# Patient Record
Sex: Female | Born: 1950 | Race: White | Hispanic: No | Marital: Married | Smoking: Current some day smoker
Health system: Southern US, Community
[De-identification: ages and names within clinical notes are randomized; demographics above are authoritative.]

## PROBLEM LIST (undated history)

## (undated) DIAGNOSIS — M1990 Unspecified osteoarthritis, unspecified site: Secondary | ICD-10-CM

## (undated) DIAGNOSIS — Z8679 Personal history of other diseases of the circulatory system: Secondary | ICD-10-CM

## (undated) DIAGNOSIS — Z9189 Other specified personal risk factors, not elsewhere classified: Secondary | ICD-10-CM

## (undated) DIAGNOSIS — D126 Benign neoplasm of colon, unspecified: Secondary | ICD-10-CM

## (undated) DIAGNOSIS — K449 Diaphragmatic hernia without obstruction or gangrene: Secondary | ICD-10-CM

## (undated) DIAGNOSIS — R011 Cardiac murmur, unspecified: Secondary | ICD-10-CM

## (undated) DIAGNOSIS — R42 Dizziness and giddiness: Secondary | ICD-10-CM

## (undated) DIAGNOSIS — J449 Chronic obstructive pulmonary disease, unspecified: Secondary | ICD-10-CM

## (undated) DIAGNOSIS — S2239XA Fracture of one rib, unspecified side, initial encounter for closed fracture: Secondary | ICD-10-CM

## (undated) DIAGNOSIS — G8929 Other chronic pain: Secondary | ICD-10-CM

## (undated) DIAGNOSIS — M4310 Spondylolisthesis, site unspecified: Secondary | ICD-10-CM

## (undated) DIAGNOSIS — I10 Essential (primary) hypertension: Secondary | ICD-10-CM

## (undated) DIAGNOSIS — M25361 Other instability, right knee: Secondary | ICD-10-CM

## (undated) DIAGNOSIS — F331 Major depressive disorder, recurrent, moderate: Secondary | ICD-10-CM

## (undated) DIAGNOSIS — T1491XA Suicide attempt, initial encounter: Secondary | ICD-10-CM

## (undated) DIAGNOSIS — M549 Dorsalgia, unspecified: Secondary | ICD-10-CM

## (undated) DIAGNOSIS — K219 Gastro-esophageal reflux disease without esophagitis: Secondary | ICD-10-CM

## (undated) DIAGNOSIS — E119 Type 2 diabetes mellitus without complications: Secondary | ICD-10-CM

## (undated) DIAGNOSIS — K760 Fatty (change of) liver, not elsewhere classified: Secondary | ICD-10-CM

## (undated) DIAGNOSIS — F419 Anxiety disorder, unspecified: Secondary | ICD-10-CM

## (undated) DIAGNOSIS — S065XAA Traumatic subdural hemorrhage with loss of consciousness status unknown, initial encounter: Secondary | ICD-10-CM

## (undated) DIAGNOSIS — R296 Repeated falls: Secondary | ICD-10-CM

## (undated) DIAGNOSIS — E7800 Pure hypercholesterolemia, unspecified: Secondary | ICD-10-CM

## (undated) DIAGNOSIS — M797 Fibromyalgia: Secondary | ICD-10-CM

## (undated) DIAGNOSIS — M545 Low back pain, unspecified: Secondary | ICD-10-CM

## (undated) DIAGNOSIS — J45909 Unspecified asthma, uncomplicated: Secondary | ICD-10-CM

## (undated) DIAGNOSIS — R531 Weakness: Secondary | ICD-10-CM

## (undated) DIAGNOSIS — K2100 Gastro-esophageal reflux disease with esophagitis, without bleeding: Secondary | ICD-10-CM

## (undated) DIAGNOSIS — M5412 Radiculopathy, cervical region: Secondary | ICD-10-CM

## (undated) DIAGNOSIS — Z01818 Encounter for other preprocedural examination: Secondary | ICD-10-CM

## (undated) DIAGNOSIS — I2699 Other pulmonary embolism without acute cor pulmonale: Secondary | ICD-10-CM

## (undated) DIAGNOSIS — J309 Allergic rhinitis, unspecified: Secondary | ICD-10-CM

## (undated) DIAGNOSIS — I209 Angina pectoris, unspecified: Secondary | ICD-10-CM

## (undated) DIAGNOSIS — E079 Disorder of thyroid, unspecified: Secondary | ICD-10-CM

## (undated) HISTORY — PX: CARDIAC CATHETERIZATION: SHX172

## (undated) HISTORY — DX: Personal history of other diseases of the circulatory system: Z86.79

## (undated) HISTORY — PX: PERIPHERAL VASCULAR THROMBECTOMY: CATH118306

## (undated) HISTORY — PX: EYE SURGERY: SHX253

## (undated) HISTORY — PX: CHOLECYSTECTOMY: SHX55

## (undated) HISTORY — PX: COLONOSCOPY: SHX174

## (undated) HISTORY — PX: ESOPHAGOGASTRODUODENOSCOPY: SHX1529

## (undated) HISTORY — PX: ABDOMINAL HYSTERECTOMY: SHX81

---
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
## 2003-10-07 DIAGNOSIS — Z9889 Other specified postprocedural states: Secondary | ICD-10-CM

## 2003-10-07 HISTORY — PX: WRIST SURGERY: SHX841

## 2003-10-07 HISTORY — DX: Other specified postprocedural states: Z98.890

## 2004-04-22 ENCOUNTER — Other Ambulatory Visit: Payer: Self-pay

## 2004-07-06 ENCOUNTER — Ambulatory Visit: Payer: Self-pay | Admitting: Internal Medicine

## 2006-04-28 ENCOUNTER — Ambulatory Visit: Payer: Self-pay | Admitting: Unknown Physician Specialty

## 2006-11-03 ENCOUNTER — Observation Stay: Payer: Self-pay | Admitting: Internal Medicine

## 2006-11-03 ENCOUNTER — Other Ambulatory Visit: Payer: Self-pay

## 2006-12-04 ENCOUNTER — Ambulatory Visit: Payer: Self-pay | Admitting: Unknown Physician Specialty

## 2007-01-19 ENCOUNTER — Emergency Department: Payer: Self-pay | Admitting: Unknown Physician Specialty

## 2007-02-01 ENCOUNTER — Ambulatory Visit: Payer: Self-pay | Admitting: Cardiology

## 2008-01-12 ENCOUNTER — Other Ambulatory Visit: Payer: Self-pay

## 2008-01-12 ENCOUNTER — Emergency Department: Payer: Self-pay | Admitting: Emergency Medicine

## 2008-07-06 ENCOUNTER — Ambulatory Visit: Payer: Self-pay | Admitting: Unknown Physician Specialty

## 2008-08-25 ENCOUNTER — Emergency Department: Payer: Self-pay | Admitting: Internal Medicine

## 2009-10-08 ENCOUNTER — Ambulatory Visit: Payer: Self-pay | Admitting: Neurosurgery

## 2010-01-10 ENCOUNTER — Encounter: Admission: RE | Admit: 2010-01-10 | Discharge: 2010-01-10 | Payer: Self-pay | Admitting: Neurosurgery

## 2010-03-06 ENCOUNTER — Ambulatory Visit: Payer: Self-pay | Admitting: Otolaryngology

## 2010-10-27 ENCOUNTER — Ambulatory Visit: Payer: Self-pay | Admitting: Neurosurgery

## 2011-10-07 HISTORY — PX: CATARACT EXTRACTION W/ INTRAOCULAR LENS IMPLANT: SHX1309

## 2012-01-27 ENCOUNTER — Ambulatory Visit: Payer: Self-pay | Admitting: Ophthalmology

## 2012-01-27 ENCOUNTER — Ambulatory Visit: Payer: Self-pay | Admitting: Unknown Physician Specialty

## 2012-01-27 DIAGNOSIS — I10 Essential (primary) hypertension: Secondary | ICD-10-CM

## 2012-01-27 LAB — POTASSIUM: Potassium: 3.8 mmol/L (ref 3.5–5.1)

## 2012-01-30 ENCOUNTER — Ambulatory Visit: Payer: Self-pay | Admitting: Unknown Physician Specialty

## 2012-02-04 ENCOUNTER — Ambulatory Visit: Payer: Self-pay | Admitting: Ophthalmology

## 2012-02-04 LAB — PATHOLOGY REPORT

## 2012-06-16 ENCOUNTER — Other Ambulatory Visit: Payer: Self-pay | Admitting: Unknown Physician Specialty

## 2012-06-16 LAB — CLOSTRIDIUM DIFFICILE BY PCR

## 2012-06-18 LAB — STOOL CULTURE

## 2012-06-24 ENCOUNTER — Other Ambulatory Visit: Payer: Self-pay | Admitting: Unknown Physician Specialty

## 2012-10-20 ENCOUNTER — Other Ambulatory Visit: Payer: Self-pay | Admitting: Neurosurgery

## 2012-11-17 ENCOUNTER — Other Ambulatory Visit: Payer: Self-pay | Admitting: Neurosurgery

## 2013-12-14 ENCOUNTER — Ambulatory Visit: Payer: Self-pay | Admitting: Unknown Physician Specialty

## 2015-01-28 NOTE — Op Note (Signed)
PATIENT NAME:  Belinda Garcia, DEBELL MR#:  [id number] DATE OF BIRTH:  03-Jan-1951  DATE OF PROCEDURE:  02/04/2012  PREOPERATIVE DIAGNOSIS:  Cataract, left eye.    POSTOPERATIVE DIAGNOSIS:  Cataract, left eye.  PROCEDURE PERFORMED:  Extracapsular cataract extraction using phacoemulsification with placement of an Alcon [id number], 20.0-diopter posterior chamber lens, serial [id number].  SURGEON:  Loura Back. Willy Pinkerton, MD  ASSISTANT:  None.  ANESTHESIA:  4% lidocaine and 0.75% Marcaine in a 50/50 mixture with 10 units/mL of Hylenex added, given as a peribulbar.   ANESTHESIOLOGIST: Dr. Benjamine Mola  COMPLICATIONS:  None.  ESTIMATED BLOOD LOSS:  Less than 1 ml.  DESCRIPTION OF PROCEDURE:  The patient was brought to the operating room and given a peribulbar block.  The patient was then prepped and draped in the usual fashion.  The vertical rectus muscles were imbricated using 5-0 silk sutures.  These sutures were then clamped to the sterile drapes as bridle sutures.  A limbal peritomy was performed extending two clock hours and hemostasis was obtained with cautery.  A partial thickness scleral groove was made at the surgical limbus and dissected anteriorly in a lamellar dissection using an Alcon crescent knife.  The anterior chamber was entered supero-temporally with a Superblade and through the lamellar dissection with a 2.6 mm keratome.  DisCoVisc was used to replace the aqueous and a continuous tear capsulorrhexis was carried out.  Hydrodissection and hydrodelineation were carried out with balanced salt and a 27 gauge canula.  The nucleus was rotated to confirm the effectiveness of the hydrodissection.  Phacoemulsification was carried out using a divide-and-conquer technique.  Total ultrasound time was 2 minutes and 11 seconds with an average power of 24.8 percent and CDE 55.63.  Irrigation/aspiration was used to remove the residual cortex.  DisCoVisc was used to inflate the capsule and the internal incision  was enlarged to 3 mm with the crescent knife.  The intraocular lens was folded and inserted into the capsular bag using the AcrySert delivery system. Irrigation/aspiration was used to remove the residual DisCoVisc.  Miostat was injected into the anterior chamber through the paracentesis track to inflate the anterior chamber and induce miosis.  The wound was checked for leaks and none were found. The conjunctiva was closed with cautery and the bridle sutures were removed.  Two drops of 0.3% Vigamox were placed on the eye.   An eye shield was placed on the eye.  The patient was discharged to the recovery room in good condition. ____________________________ Loura Back Rykar Lebleu, MD sad:slb D: 02/04/2012 11:53:06 ET T: 02/04/2012 12:04:22 ET JOB#: [id number]  cc: Remo Lipps A. Suraiya Dickerson, MD, <Dictator> Martie Lee MD ELECTRONICALLY SIGNED 02/09/2012 13:44

## 2015-11-28 DIAGNOSIS — J45909 Unspecified asthma, uncomplicated: Secondary | ICD-10-CM | POA: Diagnosis present

## 2015-11-28 DIAGNOSIS — R011 Cardiac murmur, unspecified: Secondary | ICD-10-CM | POA: Insufficient documentation

## 2015-11-28 DIAGNOSIS — I10 Essential (primary) hypertension: Secondary | ICD-10-CM | POA: Diagnosis present

## 2015-11-28 DIAGNOSIS — E119 Type 2 diabetes mellitus without complications: Secondary | ICD-10-CM

## 2015-11-28 DIAGNOSIS — I2699 Other pulmonary embolism without acute cor pulmonale: Secondary | ICD-10-CM | POA: Diagnosis present

## 2016-03-24 ENCOUNTER — Emergency Department
Admission: EM | Admit: 2016-03-24 | Discharge: 2016-03-24 | Disposition: A | Discharge status: Home / Self Care | Payer: Medicare Other | Attending: Emergency Medicine | Admitting: Emergency Medicine

## 2016-03-24 ENCOUNTER — Emergency Department: Payer: Medicare Other

## 2016-03-24 ENCOUNTER — Encounter: Payer: Self-pay | Admitting: Emergency Medicine

## 2016-03-24 DIAGNOSIS — Z7982 Long term (current) use of aspirin: Secondary | ICD-10-CM | POA: Diagnosis not present

## 2016-03-24 DIAGNOSIS — I10 Essential (primary) hypertension: Secondary | ICD-10-CM | POA: Diagnosis not present

## 2016-03-24 DIAGNOSIS — W1800XA Striking against unspecified object with subsequent fall, initial encounter: Secondary | ICD-10-CM | POA: Insufficient documentation

## 2016-03-24 DIAGNOSIS — S0990XA Unspecified injury of head, initial encounter: Secondary | ICD-10-CM

## 2016-03-24 DIAGNOSIS — M549 Dorsalgia, unspecified: Secondary | ICD-10-CM | POA: Insufficient documentation

## 2016-03-24 DIAGNOSIS — G8929 Other chronic pain: Secondary | ICD-10-CM | POA: Diagnosis not present

## 2016-03-24 DIAGNOSIS — E119 Type 2 diabetes mellitus without complications: Secondary | ICD-10-CM | POA: Insufficient documentation

## 2016-03-24 DIAGNOSIS — F17210 Nicotine dependence, cigarettes, uncomplicated: Secondary | ICD-10-CM | POA: Insufficient documentation

## 2016-03-24 DIAGNOSIS — Y939 Activity, unspecified: Secondary | ICD-10-CM | POA: Diagnosis not present

## 2016-03-24 DIAGNOSIS — Y929 Unspecified place or not applicable: Secondary | ICD-10-CM | POA: Insufficient documentation

## 2016-03-24 DIAGNOSIS — R110 Nausea: Secondary | ICD-10-CM

## 2016-03-24 DIAGNOSIS — S069X9A Unspecified intracranial injury with loss of consciousness of unspecified duration, initial encounter: Secondary | ICD-10-CM | POA: Diagnosis not present

## 2016-03-24 DIAGNOSIS — R42 Dizziness and giddiness: Secondary | ICD-10-CM

## 2016-03-24 DIAGNOSIS — Z79899 Other long term (current) drug therapy: Secondary | ICD-10-CM | POA: Diagnosis not present

## 2016-03-24 DIAGNOSIS — Y999 Unspecified external cause status: Secondary | ICD-10-CM | POA: Diagnosis not present

## 2016-03-24 HISTORY — DX: Anxiety disorder, unspecified: F41.9

## 2016-03-24 HISTORY — DX: Essential (primary) hypertension: I10

## 2016-03-24 HISTORY — DX: Gastro-esophageal reflux disease without esophagitis: K21.9

## 2016-03-24 HISTORY — DX: Type 2 diabetes mellitus without complications: E11.9

## 2016-03-24 HISTORY — DX: Pure hypercholesterolemia, unspecified: E78.00

## 2016-03-24 HISTORY — DX: Disorder of thyroid, unspecified: E07.9

## 2016-03-24 HISTORY — DX: Diaphragmatic hernia without obstruction or gangrene: K44.9

## 2016-03-24 LAB — URINALYSIS COMPLETE WITH MICROSCOPIC (ARMC ONLY)
BACTERIA UA: NONE SEEN
Bilirubin Urine: NEGATIVE
Glucose, UA: NEGATIVE mg/dL
Hgb urine dipstick: NEGATIVE
KETONES UR: NEGATIVE mg/dL
LEUKOCYTES UA: NEGATIVE
Nitrite: NEGATIVE
PH: 7 (ref 5.0–8.0)
PROTEIN: NEGATIVE mg/dL
Specific Gravity, Urine: 1.004 — ABNORMAL LOW (ref 1.005–1.030)

## 2016-03-24 LAB — CBC
HEMATOCRIT: 48 % — AB (ref 35.0–47.0)
HEMOGLOBIN: 15.9 g/dL (ref 12.0–16.0)
MCH: 31.8 pg (ref 26.0–34.0)
MCHC: 33.2 g/dL (ref 32.0–36.0)
MCV: 95.8 fL (ref 80.0–100.0)
PLATELETS: 345 10*3/uL (ref 150–440)
RBC: 5 MIL/uL (ref 3.80–5.20)
RDW: 13.2 % (ref 11.5–14.5)
WBC: 9.3 10*3/uL (ref 3.6–11.0)

## 2016-03-24 LAB — BASIC METABOLIC PANEL
Anion gap: 10 (ref 5–15)
BUN: 9 mg/dL (ref 6–20)
CHLORIDE: 103 mmol/L (ref 101–111)
CO2: 24 mmol/L (ref 22–32)
Calcium: 9.3 mg/dL (ref 8.9–10.3)
Creatinine, Ser: 0.7 mg/dL (ref 0.44–1.00)
GFR calc non Af Amer: >60 mL/min (ref >60)
Glucose, Bld: 138 mg/dL — ABNORMAL HIGH (ref 65–99)
POTASSIUM: 3.7 mmol/L (ref 3.5–5.1)
SODIUM: 137 mmol/L (ref 135–145)

## 2016-03-24 MED ORDER — ONDANSETRON 4 MG PO TBDP
4.0000 mg | ORAL_TABLET | Freq: Once | ORAL | Status: AC
  Administered 2016-03-24: 4 mg via ORAL
  Filled 2016-03-24: qty 1

## 2016-03-24 MED ORDER — MECLIZINE HCL 25 MG PO TABS
25.0000 mg | ORAL_TABLET | Freq: Once | ORAL | Status: AC
  Administered 2016-03-24: 25 mg via ORAL
  Filled 2016-03-24: qty 1

## 2016-03-24 MED ORDER — MECLIZINE HCL 25 MG PO TABS: 25.0000 mg | ORAL_TABLET | Freq: Three times a day (TID) | ORAL | Status: DC | PRN

## 2016-03-24 MED ORDER — ONDANSETRON 4 MG PO TBDP: ORAL_TABLET | ORAL | Status: DC

## 2016-03-24 NOTE — Discharge Instructions (Signed)

## 2016-03-24 NOTE — ED Provider Notes (Signed)
[facility] Emergency Department Provider Note  ____________________________________________  Time seen: Approximately 3:43 PM  I have reviewed the triage vital signs and the nursing notes.   HISTORY  Chief Complaint Back Pain; Loss of Consciousness; and Headache    HPI MANASA LAROCHE is a 65 y.o. female with a PMH of workman's comp related back injury and chronic back pain followed by a neurologist.  She presents for evaluation of lightheadedness, dizziness, and nausea after a fall yesterday.  She states that her back "locked up on me" yesterday causing her to fall and strike her head on the floor.  She states she was knocked unconscious for an unspecified period of time.  Since then she has been nauseated and had a lack of appetite, as well as feeling dizzy and lightheaded when she ambulates.  She called her neurologist's office and they recommended coming to the emergency department for evaluation.  She denies any neck pain and only a mild generalized headache mostly in the frontal area where she struck her head.  She has had no numbness nor tingling in any of her extremities.  She has no difficulty with coordination.  She has had nausea but no vomiting.  She denies chest pain and shortness of breath.  She states that her back feels the same as it always does and that she did not strike her back or sustain any other traumatic injuries.  Symptoms overall as mild to severe, helped with rest and worse with exertion.   Past Medical History  Diagnosis Date  . Diabetes mellitus without complication ([place])   . Thyroid disease   . Hypertension   . High cholesterol   . Anxiety   . GERD (gastroesophageal reflux disease)   . Hiatal hernia     There are no active problems to display for this patient.   Past Surgical History  Procedure Laterality Date  . Abdominal hysterectomy    . Cholecystectomy      Current Outpatient Rx  Name  Route  Sig  Dispense  Refill   . amLODipine (NORVASC) 10 MG tablet   Oral   Take 5 mg by mouth 2 (two) times daily.         Marland Kitchen aspirin-acetaminophen-caffeine (EXCEDRIN MIGRAINE) 250-250-65 MG tablet   Oral   Take 2 tablets by mouth every 6 (six) hours as needed for migraine.         Marland Kitchen atorvastatin (LIPITOR) 40 MG tablet   Oral   Take 40 mg by mouth at bedtime.         . cholecalciferol (VITAMIN D) 1000 units tablet   Oral   Take 1,000 Units by mouth daily.         . cyclobenzaprine (FLEXERIL) 10 MG tablet   Oral   Take 10 mg by mouth 3 (three) times daily as needed for muscle spasms.         Marland Kitchen escitalopram (LEXAPRO) 10 MG tablet   Oral   Take 10 mg by mouth daily.         Marland Kitchen levothyroxine (SYNTHROID, LEVOTHROID) 125 MCG tablet   Oral   Take 125 mcg by mouth daily before breakfast.         . losartan-hydrochlorothiazide (HYZAAR) 100-25 MG tablet   Oral   Take 1 tablet by mouth daily.         . meloxicam (MOBIC) 15 MG tablet   Oral   Take 15 mg by mouth daily.         Marland Kitchen  metoprolol succinate (TOPROL-XL) 25 MG 24 hr tablet   Oral   Take 25 mg by mouth daily.         . montelukast (SINGULAIR) 10 MG tablet   Oral   Take 10 mg by mouth at bedtime.         Marland Kitchen oxyCODONE-acetaminophen (PERCOCET) 10-325 MG tablet   Oral   Take 1 tablet by mouth every 4 (four) hours as needed for pain.         . pregabalin (LYRICA) 75 MG capsule   Oral   Take 75 mg by mouth 2 (two) times daily.         . traZODone (DESYREL) 100 MG tablet   Oral   Take 200 mg by mouth at bedtime.         . meclizine (ANTIVERT) 25 MG tablet   Oral   Take 1 tablet (25 mg total) by mouth 3 (three) times daily as needed for dizziness.   30 tablet   0   . ondansetron (ZOFRAN ODT) 4 MG disintegrating tablet      Allow 1-2 tablets to dissolve in your mouth every 8 hours as needed for nausea/vomiting   30 tablet   0     Allergies Review of patient's allergies indicates no known allergies.  No family  history on file.  Social History Social History  Substance Use Topics  . Smoking status: Current Some Day Smoker    Types: Cigarettes  . Smokeless tobacco: Never Used  . Alcohol Use: No    Review of Systems Constitutional: No fever/chills Eyes: No visual changes. ENT: No sore throat. Cardiovascular: Denies chest pain. Respiratory: Denies shortness of breath. Gastrointestinal: No abdominal pain.  No nausea, no vomiting.  No diarrhea.  No constipation. Genitourinary: Negative for dysuria. Musculoskeletal: Chronic back pain.  Frontal head contusion. Skin: Negative for rash. Neurological: Negative for headaches, focal weakness or numbness.  10-point ROS otherwise negative.  ____________________________________________   PHYSICAL EXAM:  VITAL SIGNS: ED Triage Vitals  Enc Vitals Group     BP 03/24/16 1323 124/60 mmHg     Pulse Rate 03/24/16 1323 62     Resp 03/24/16 1323 16     Temp 03/24/16 1323 97.9 F (36.6 C)     Temp Source 03/24/16 1323 Oral     SpO2 03/24/16 1323 97 %     Weight 03/24/16 1323 205 lb (92.987 kg)     Height 03/24/16 1323 5\' 5"  (1.651 m)     Head Cir --      Peak Flow --      Pain Score 03/24/16 1320 10     Pain Loc --      Pain Edu? --      Excl. in [place]? --     Constitutional: Alert and oriented. Well appearing and in no acute distress. Eyes: Conjunctivae are normal. PERRL. EOMI.  Pterygium of left eye.  No nystagmus for following finger made patient more dizzy. Head: Atraumatic. Nose: No congestion/rhinnorhea. Mouth/Throat: Mucous membranes are moist.  Oropharynx non-erythematous. Neck: No stridor.  No meningeal signs.  No cervical spine tenderness to palpation. Cardiovascular: Normal rate, regular rhythm. Good peripheral circulation. Grossly normal heart sounds.   Respiratory: Normal respiratory effort.  No retractions. Lungs CTAB. Gastrointestinal: Soft and nontender. No distention.  Musculoskeletal: No lower extremity tenderness nor edema.  No gross deformities of extremities. Neurologic:  Normal speech and language. No gross focal neurologic deficits are appreciated.  Strength is intact in major  muscle groups of the extremities.  Normal finger to nose.  No nystagmus.  GCS 15. Skin:  Skin is warm, dry and intact. No rash noted. Psychiatric: Mood and affect are normal. Speech and behavior are normal.  ____________________________________________   LABS (all labs ordered are listed, but only abnormal results are displayed)  Labs Reviewed  BASIC METABOLIC PANEL - Abnormal; Notable for the following:    Glucose, Bld 138 (*)    All other components within normal limits  CBC - Abnormal; Notable for the following:    HCT 48.0 (*)    All other components within normal limits  URINALYSIS COMPLETEWITH MICROSCOPIC ([facility] ONLY) - Abnormal; Notable for the following:    Color, Urine YELLOW (*)    APPearance CLEAR (*)    Specific Gravity, Urine 1.004 (*)    Squamous Epithelial / LPF 0-5 (*)    All other components within normal limits  CBG MONITORING, ED   ____________________________________________  EKG  ED ECG REPORT I, Takeyah Wieman, the attending physician, personally viewed and interpreted this ECG.  Date: 03/24/2016 EKG Time: 13:37 Rate: 62 Rhythm: normal sinus rhythm QRS Axis: normal Intervals: normal ST/T Wave abnormalities: Non-specific ST segment / T-wave changes, but no evidence of acute ischemia. Conduction Disturbances: none Narrative Interpretation: unremarkable  ____________________________________________  RADIOLOGY   Ct Head Wo Contrast  03/24/2016  CLINICAL DATA:  Initial encounter for [facility] yesterday, hit forehead. LOC. Continued weakness. EXAM: CT HEAD WITHOUT CONTRAST TECHNIQUE: Contiguous axial images were obtained from the base of the skull through the vertex without intravenous contrast. COMPARISON:  12/14/2013 FINDINGS: Sinuses/Soft tissues: No significant soft tissue swelling. No skull  fracture. Clear paranasal sinuses and mastoid air cells. Intracranial: No mass lesion, hemorrhage, hydrocephalus, acute infarct, intra-axial, or extra-axial fluid collection. IMPRESSION: No acute intracranial abnormality. Electronically Signed   By: Abigail Miyamoto M.D.   On: 03/24/2016 14:14    ____________________________________________   PROCEDURES  Procedure(s) performed:   Procedures   ____________________________________________   INITIAL IMPRESSION / ASSESSMENT AND PLAN / ED COURSE  Pertinent labs & imaging results that were available during my care of the patient were reviewed by me and considered in my medical decision making (see chart for details).  The patient appears well other than the dizziness she continues to experience.  She states that she felt better after meclizine and Zofran.  She still does not have an appetite.  We had a long conversation about head injuries and some of the symptoms she may expect to experience for a while, but I explained that her exam and workup were reassuring.  One of her sons expressed concern about her back, but the patient herself clarified that she has no additional back pain compared to normal and that it feels exactly the same as usual.  I encouraged very close outpatient follow-up with her neurosurgeon with a phone call tomorrow morning for the next available follow-up appointment.  She understands and agrees with the plan.   ____________________________________________  FINAL CLINICAL IMPRESSION(S) / ED DIAGNOSES  Final diagnoses:  Closed head injury, initial encounter  Dizziness  Nausea  Chronic back pain     MEDICATIONS GIVEN DURING THIS VISIT:  Medications  meclizine (ANTIVERT) tablet 25 mg (25 mg Oral Given 03/24/16 1615)  ondansetron (ZOFRAN-ODT) disintegrating tablet 4 mg (4 mg Oral Given 03/24/16 1611)     NEW OUTPATIENT MEDICATIONS STARTED DURING THIS VISIT:  New Prescriptions   MECLIZINE (ANTIVERT) 25 MG TABLET     Take 1 tablet (25  mg total) by mouth 3 (three) times daily as needed for dizziness.   ONDANSETRON (ZOFRAN ODT) 4 MG DISINTEGRATING TABLET    Allow 1-2 tablets to dissolve in your mouth every 8 hours as needed for nausea/vomiting      Note:  This document was prepared using Dragon voice recognition software and may include unintentional dictation errors.   Hinda Kehr, MD 03/24/16 1743

## 2016-03-24 NOTE — ED Notes (Signed)
Pt states her back pain from previous incident was bothering her yesterday and she had a syncople episode yesterday , states since she is having dizziness with HA and nausea.Marland Kitchen

## 2016-03-24 NOTE — ED Notes (Signed)
Nausea decreased, continues dizzy.

## 2016-07-08 ENCOUNTER — Other Ambulatory Visit: Payer: Self-pay | Admitting: Obstetrics & Gynecology

## 2016-07-08 DIAGNOSIS — Z1231 Encounter for screening mammogram for malignant neoplasm of breast: Secondary | ICD-10-CM

## 2016-07-09 ENCOUNTER — Other Ambulatory Visit: Payer: Self-pay | Admitting: *Deleted

## 2016-07-09 ENCOUNTER — Inpatient Hospital Stay
Admission: RE | Admit: 2016-07-09 | Discharge: 2016-07-09 | Disposition: A | Discharge status: Home / Self Care | Payer: Self-pay | Source: Ambulatory Visit | Attending: *Deleted | Admitting: *Deleted

## 2016-07-09 DIAGNOSIS — Z9289 Personal history of other medical treatment: Secondary | ICD-10-CM

## 2016-07-21 ENCOUNTER — Ambulatory Visit
Admission: RE | Admit: 2016-07-21 | Discharge: 2016-07-21 | Disposition: A | Discharge status: Home / Self Care | Payer: Medicare Other | Source: Ambulatory Visit | Attending: Obstetrics & Gynecology | Admitting: Obstetrics & Gynecology

## 2016-07-21 DIAGNOSIS — Z1231 Encounter for screening mammogram for malignant neoplasm of breast: Secondary | ICD-10-CM | POA: Diagnosis not present

## 2017-04-02 ENCOUNTER — Emergency Department
Admission: EM | Admit: 2017-04-02 | Discharge: 2017-04-03 | Disposition: A | Discharge status: Other Acute Inpatient Hospital | Payer: Medicare Other | Attending: Emergency Medicine | Admitting: Emergency Medicine

## 2017-04-02 DIAGNOSIS — F17210 Nicotine dependence, cigarettes, uncomplicated: Secondary | ICD-10-CM | POA: Insufficient documentation

## 2017-04-02 DIAGNOSIS — R45851 Suicidal ideations: Secondary | ICD-10-CM | POA: Diagnosis present

## 2017-04-02 DIAGNOSIS — I10 Essential (primary) hypertension: Secondary | ICD-10-CM | POA: Diagnosis not present

## 2017-04-02 DIAGNOSIS — E119 Type 2 diabetes mellitus without complications: Secondary | ICD-10-CM | POA: Diagnosis not present

## 2017-04-02 DIAGNOSIS — F332 Major depressive disorder, recurrent severe without psychotic features: Secondary | ICD-10-CM | POA: Diagnosis not present

## 2017-04-02 DIAGNOSIS — E039 Hypothyroidism, unspecified: Secondary | ICD-10-CM | POA: Insufficient documentation

## 2017-04-02 LAB — URINE DRUG SCREEN, QUALITATIVE (ARMC ONLY)
AMPHETAMINES, UR SCREEN: NOT DETECTED
BENZODIAZEPINE, UR SCRN: POSITIVE — AB
Barbiturates, Ur Screen: NOT DETECTED
COCAINE METABOLITE, UR ~~LOC~~: NOT DETECTED
Cannabinoid 50 Ng, Ur ~~LOC~~: NOT DETECTED
MDMA (Ecstasy)Ur Screen: NOT DETECTED
Methadone Scn, Ur: NOT DETECTED
OPIATE, UR SCREEN: NOT DETECTED
PHENCYCLIDINE (PCP) UR S: NOT DETECTED
Tricyclic, Ur Screen: NOT DETECTED

## 2017-04-02 LAB — COMPREHENSIVE METABOLIC PANEL
ALK PHOS: 89 U/L (ref 38–126)
ALT: 28 U/L (ref 14–54)
ANION GAP: 10 (ref 5–15)
AST: 21 U/L (ref 15–41)
Albumin: 3.8 g/dL (ref 3.5–5.0)
BILIRUBIN TOTAL: 0.8 mg/dL (ref 0.3–1.2)
BUN: 10 mg/dL (ref 6–20)
CALCIUM: 9.2 mg/dL (ref 8.9–10.3)
CO2: 25 mmol/L (ref 22–32)
Chloride: 98 mmol/L — ABNORMAL LOW (ref 101–111)
Creatinine, Ser: 0.49 mg/dL (ref 0.44–1.00)
GFR calc non Af Amer: >60 mL/min (ref >60)
Glucose, Bld: 110 mg/dL — ABNORMAL HIGH (ref 65–99)
Potassium: 3.5 mmol/L (ref 3.5–5.1)
SODIUM: 133 mmol/L — AB (ref 135–145)
TOTAL PROTEIN: 6.9 g/dL (ref 6.5–8.1)

## 2017-04-02 LAB — URINALYSIS, COMPLETE (UACMP) WITH MICROSCOPIC
Bacteria, UA: NONE SEEN
Bilirubin Urine: NEGATIVE
GLUCOSE, UA: NEGATIVE mg/dL
Hgb urine dipstick: NEGATIVE
KETONES UR: NEGATIVE mg/dL
LEUKOCYTES UA: NEGATIVE
NITRITE: NEGATIVE
PH: 6 (ref 5.0–8.0)
Protein, ur: NEGATIVE mg/dL
Specific Gravity, Urine: 1.003 — ABNORMAL LOW (ref 1.005–1.030)

## 2017-04-02 LAB — CBC WITH DIFFERENTIAL/PLATELET
BASOS ABS: 0.1 10*3/uL (ref 0–0.1)
Basophils Relative: 1 %
EOS PCT: 4 %
Eosinophils Absolute: 0.4 10*3/uL (ref 0–0.7)
HCT: 43.2 % (ref 35.0–47.0)
Hemoglobin: 14.9 g/dL (ref 12.0–16.0)
Lymphocytes Relative: 38 %
Lymphs Abs: 3.9 10*3/uL — ABNORMAL HIGH (ref 1.0–3.6)
MCH: 33 pg (ref 26.0–34.0)
MCHC: 34.6 g/dL (ref 32.0–36.0)
MCV: 95.5 fL (ref 80.0–100.0)
MONO ABS: 0.6 10*3/uL (ref 0.2–0.9)
Monocytes Relative: 6 %
Neutro Abs: 5.2 10*3/uL (ref 1.4–6.5)
Neutrophils Relative %: 51 %
PLATELETS: 315 10*3/uL (ref 150–440)
RBC: 4.52 MIL/uL (ref 3.80–5.20)
RDW: 13.1 % (ref 11.5–14.5)
WBC: 10.2 10*3/uL (ref 3.6–11.0)

## 2017-04-02 LAB — ETHANOL: ALCOHOL ETHYL (B): 83 mg/dL — AB (ref <5)

## 2017-04-02 LAB — LIPASE, BLOOD: LIPASE: 28 U/L (ref 11–51)

## 2017-04-02 LAB — ACETAMINOPHEN LEVEL: Acetaminophen (Tylenol), Serum: <10 ug/mL — ABNORMAL LOW (ref 10–30)

## 2017-04-02 LAB — SALICYLATE LEVEL

## 2017-04-02 MED ORDER — OXYCODONE-ACETAMINOPHEN 10-325 MG PO TABS
1.0000 | ORAL_TABLET | ORAL | Status: DC | PRN
  Filled 2017-04-02: qty 1

## 2017-04-02 MED ORDER — AMLODIPINE BESYLATE 5 MG PO TABS
5.0000 mg | ORAL_TABLET | Freq: Two times a day (BID) | ORAL | Status: DC
  Administered 2017-04-02 – 2017-04-03 (×2): 5 mg via ORAL
  Filled 2017-04-02 (×2): qty 1

## 2017-04-02 MED ORDER — HYDROCHLOROTHIAZIDE 25 MG PO TABS
25.0000 mg | ORAL_TABLET | Freq: Every day | ORAL | Status: DC
  Administered 2017-04-02 – 2017-04-03 (×2): 25 mg via ORAL
  Filled 2017-04-02 (×2): qty 1

## 2017-04-02 MED ORDER — CYCLOBENZAPRINE HCL 10 MG PO TABS: 10.0000 mg | ORAL_TABLET | Freq: Three times a day (TID) | ORAL | Status: DC | PRN

## 2017-04-02 MED ORDER — MELOXICAM 15 MG PO TABS
15.0000 mg | ORAL_TABLET | Freq: Every day | ORAL | Status: DC
  Administered 2017-04-02 – 2017-04-03 (×2): 15 mg via ORAL
  Filled 2017-04-02 (×2): qty 1

## 2017-04-02 MED ORDER — METOPROLOL SUCCINATE ER 50 MG PO TB24
25.0000 mg | ORAL_TABLET | Freq: Every day | ORAL | Status: DC
  Administered 2017-04-02: 25 mg via ORAL

## 2017-04-02 MED ORDER — TRAZODONE HCL 100 MG PO TABS
200.0000 mg | ORAL_TABLET | Freq: Every day | ORAL | Status: DC
  Administered 2017-04-02: 200 mg via ORAL
  Filled 2017-04-02: qty 2

## 2017-04-02 MED ORDER — METOPROLOL SUCCINATE ER 50 MG PO TB24
ORAL_TABLET | ORAL | Status: AC
  Administered 2017-04-02: 25 mg via ORAL
  Filled 2017-04-02: qty 1

## 2017-04-02 MED ORDER — PREGABALIN 75 MG PO CAPS
75.0000 mg | ORAL_CAPSULE | Freq: Two times a day (BID) | ORAL | Status: DC
  Administered 2017-04-02 – 2017-04-03 (×2): 75 mg via ORAL
  Filled 2017-04-02 (×2): qty 1

## 2017-04-02 MED ORDER — ESCITALOPRAM OXALATE 10 MG PO TABS
ORAL_TABLET | ORAL | Status: AC
  Administered 2017-04-02: 10 mg via ORAL
  Filled 2017-04-02: qty 1

## 2017-04-02 MED ORDER — ESCITALOPRAM OXALATE 10 MG PO TABS
10.0000 mg | ORAL_TABLET | Freq: Every day | ORAL | Status: DC
  Administered 2017-04-02 – 2017-04-03 (×2): 10 mg via ORAL
  Filled 2017-04-02: qty 1

## 2017-04-02 MED ORDER — LOSARTAN POTASSIUM 50 MG PO TABS
100.0000 mg | ORAL_TABLET | Freq: Every day | ORAL | Status: DC
  Administered 2017-04-02 – 2017-04-03 (×2): 100 mg via ORAL
  Filled 2017-04-02 (×2): qty 2

## 2017-04-02 MED ORDER — LEVOTHYROXINE SODIUM 50 MCG PO TABS
125.0000 ug | ORAL_TABLET | Freq: Every day | ORAL | Status: DC
Start: 2017-04-03 — End: 2017-04-03
  Administered 2017-04-03: 125 ug via ORAL
  Filled 2017-04-02: qty 3

## 2017-04-02 MED ORDER — LOSARTAN POTASSIUM-HCTZ 100-25 MG PO TABS: 1.0000 | ORAL_TABLET | Freq: Every day | ORAL | Status: DC

## 2017-04-02 MED ORDER — ATORVASTATIN CALCIUM 20 MG PO TABS
40.0000 mg | ORAL_TABLET | Freq: Every day | ORAL | Status: DC
  Administered 2017-04-02: 40 mg via ORAL
  Filled 2017-04-02: qty 2

## 2017-04-02 NOTE — ED Notes (Signed)
Patient has been accepted to [facility].  Room is to be assigned upon arrival Accepting physician is Dr. Jonelle Sports.  Call report to [phone].  Representative was [employer].  ER Staff is aware of it ([facility].; Dr. Beather Arbour, [place] Patient's Nurse)     Patient is to arrive after 9:00a.m.

## 2017-04-02 NOTE — ED Notes (Signed)
TTS inquired about the weapon at home and was informed that the police were notified about the weapon by EMS.

## 2017-04-02 NOTE — ED Notes (Signed)
Referral information for Psychiatric Hospitalization faxed to;     [facility] [phone]),    [facility] [phone]),    [place] [phone]),    [place] [phone] or [phone]),    [facility] [phone])   [facility] [phone]),

## 2017-04-02 NOTE — ED Notes (Signed)
Telepsych physician called this RN and states he is ready to speak with pt. This RN verified Belinda Garcia is in rm at this time and pt is ready to be contacted.

## 2017-04-02 NOTE — ED Provider Notes (Signed)
-----------------------------------------   11:34 PM on 04/02/2017 -----------------------------------------  Notified by TTS that patient has been accepted to [facility]. Will be transported tomorrow morning.   Paulette Blanch, MD 04/03/17 [phone]

## 2017-04-02 NOTE — ED Triage Notes (Signed)
She arrives today from home via [facility] with reports of suicidal ideation with a plan to shoot herself in the head  Spouse called 911  EMS reports that guns were seen in the home

## 2017-04-02 NOTE — ED Notes (Addendum)
Pt states she is able to ambulate without a cane in short distances however pt states at this time she "feels tired and ready to go to bed." Pt asked if she would be able to ambulate without cane in new area which pt states "at this time I do not think I can." BHU called and updated about this and that pt will be staying in rm 20. Pt is A&O at this time.

## 2017-04-02 NOTE — ED Notes (Signed)
Pt's clothing was changed by me and tracy tech  Pt had on a gown that zipped up the front that was white with blue stripes - a black bra and a black pair of shorts  One golden colored ring with no stones  One pair of golden colored earrings with no stones  A yellow bracelet with scripture on it  A pillow with a lavender pillowcase and a pair of slip on sandals   She arrives with a four pronged cane

## 2017-04-02 NOTE — ED Notes (Signed)
Pt states she takes BP medications and pain medication at night as well as in the am. Pt notified night time medications will be adm around 10pm, pt verbalized understanding of this.

## 2017-04-02 NOTE — ED Notes (Signed)
Sandwich, chips and graham crackers provided to pt with sprite.

## 2017-04-02 NOTE — BH Assessment (Signed)
Assessment Note  Belinda Garcia is an 66 y.o. female. Belinda Garcia reports that she came to the ED due to chronic daily pay and her nerves are bad.  She states that she has been going to a psychiatrist and it is not helping.  She states that her back and knee pains are unbearable and she is unable to do anything.  She reports that her back "locks up and burns".  She reports that she cannot lay flat or sleep in a bed. She reports symptoms of depressions.  She identified having a sense of worthlessness, isolating, cannot be in a crowd, irritability, insomnia, and a decreased appetite. "I'm useless to everybody".  She worries about her health and not being able to do the things that she needs to do.  She reports having visual and auditory hallucinations last night, but none today.  She reports that she is having suicidal thoughts with a plan to use her father's revolver, which she has access to.  She denied homicidal ideation or intent.  She currently resides with her husband.  Diagnosis: Depression  Past Medical History:  Past Medical History:  Diagnosis Date  . Anxiety   . Diabetes mellitus without complication ([place])   . GERD (gastroesophageal reflux disease)   . Hiatal hernia   . High cholesterol   . Hypertension   . Thyroid disease     Past Surgical History:  Procedure Laterality Date  . ABDOMINAL HYSTERECTOMY    . CHOLECYSTECTOMY      Family History: No family history on file.  Social History:  reports that she has been smoking Cigarettes.  She has never used smokeless tobacco. She reports that she does not drink alcohol or use drugs.  Additional Social History:  Alcohol / Drug Use History of alcohol / drug use?: No history of alcohol / drug abuse  CIWA: CIWA-Ar BP: 119/76 Pulse Rate: 81 COWS:    Allergies: No Known Allergies  Home Medications:  (Not in a hospital admission)  OB/GYN Status:  No LMP recorded. Patient has had a hysterectomy.  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 Nevada Crane - [facility] Name of Therapist: None at this time  Education Status Is patient currently in school?: No Current Grade: n/a Highest grade of school patient has completed: 2 years of college Name of school: [employer] person: n/a  Risk to self with the past 6 months Suicidal Ideation: Yes-Currently Present Has patient been a risk to self within the past 6 months prior to admission? : Yes Suicidal Intent: Yes-Currently Present Has patient had any suicidal intent within the past 6 months prior to admission? : Yes Is patient at risk for suicide?: No (Currently in the hospital) Suicidal Plan?: Yes-Currently Present Has patient had any suicidal plan within the past 6 months prior to admission? : Yes Specify Current Suicidal Plan: Shoot herself with her father's revolver Access to Means: Yes Specify Access to Suicidal  Means: Gun is in her home What has been your use of drugs/alcohol within the last 12 months?: denied use Previous Attempts/Gestures: Yes How many times?: 1 Other Self Harm Risks: denied Triggers for Past Attempts: Unknown Intentional Self Injurious Behavior: None Family Suicide History: Yes (Aunt, 2 cousins) Recent stressful life  event(s): Other (Comment) (Pain) Persecutory voices/beliefs?: No Depression: Yes Depression Symptoms: Feeling worthless/self pity Substance abuse history and/or treatment for substance abuse?: No Suicide prevention information given to non-admitted patients: Not applicable  Risk to Others within the past 6 months Homicidal Ideation: No Does patient have any lifetime risk of violence toward others beyond the six months prior to admission? : No Thoughts of Harm to Others: No Current Homicidal Intent: No Current Homicidal Plan: No Access to Homicidal Means: No Identified Victim: None identified History of harm to others?: No Assessment of Violence: None Noted Violent Behavior Description: denied Does patient have access to weapons?: Yes (Comment) Criminal Charges Pending?: No Does patient have a court date: No Is patient on probation?: No  Psychosis Hallucinations: None noted (None reported today, hallucinations 04/01/2017) Delusions: None noted  Mental Status Report Appearance/Hygiene: In scrubs Eye Contact: Fair Motor Activity: Unremarkable Speech: Logical/coherent Level of Consciousness: Alert Mood: Depressed Affect: Depressed Anxiety Level: None Thought Processes: Coherent Judgement: Partial Orientation: Place, Person, Time, Situation Obsessive Compulsive Thoughts/Behaviors: None  Cognitive Functioning Concentration: Decreased Memory: Recent Intact IQ: Average Insight: Fair Impulse Control: Fair Appetite: Poor Sleep: Decreased Vegetative Symptoms: None  ADLScreening [facility] Assessment Services) Patient's cognitive ability adequate to safely complete daily activities?: Yes Patient able to express need for assistance with ADLs?: Yes Independently performs ADLs?: No (Needs assistance showering)  Prior Inpatient Therapy Prior Therapy Dates: 1993/1994 Prior Therapy Facilty/Provider([facility]): [facility] Reason for Treatment: "Grief Stricken"  Prior Outpatient  Therapy Prior Outpatient Therapy: Yes Prior Therapy Dates: Current Prior Therapy Facilty/Provider(s): Dr. Nevada Crane Reason for Treatment: Depression/nerves Does patient have an ACCT team?: No Does patient have Intensive In-House Services?  : No Does patient have [facility] services? : No Does patient have P4CC services?: No  ADL Screening (condition at time of admission) Patient's cognitive ability adequate to safely complete daily activities?: Yes Is the patient deaf or have difficulty hearing?: No Does the patient have difficulty seeing, even when wearing glasses/contacts?: No Does the patient have difficulty concentrating, remembering, or making decisions?: No Patient able to express need for assistance with ADLs?: Yes Does the patient have difficulty dressing or bathing?: Yes (Difficulty showering) Independently performs ADLs?: No (Needs assistance showering) Communication: Independent Dressing (OT): Independent Grooming: Independent Feeding: Independent Bathing: Needs assistance Toileting: Independent In/Out Bed: Needs assistance Walks in Home: Independent with device (comment) (Uses a cane) Does the patient have difficulty walking or climbing stairs?: Yes Weakness of Legs: Right Weakness of Arms/Hands: Right  Home Assistive Devices/Equipment Home Assistive Devices/Equipment: Cane (specify quad or straight) Theme park manager)    Abuse/Neglect Assessment (Assessment to be complete while patient is alone) Physical Abuse: Denies Verbal Abuse: Denies Sexual Abuse: Denies Exploitation of patient/patient's resources: Denies Self-Neglect: Denies     Regulatory affairs officer (For Healthcare) Does Patient Have a Medical Advance Directive?: No    Additional Information 1:1 In Past 12 Months?: No CIRT Risk: No Elopement Risk: No Does patient have medical clearance?: Yes     Disposition:  Disposition Initial Assessment Completed for this Encounter: Yes Disposition of Patient: Outpatient  treatment  On Site Evaluation by:   Reviewed with Physician:    Elmer Bales 04/02/2017 9:08 PM

## 2017-04-02 NOTE — ED Notes (Signed)
SOC placed in rm with pt. Pt notified of what the plans are for care and verbalized understanding of this.

## 2017-04-02 NOTE — ED Provider Notes (Signed)
[facility] Emergency Department Provider Note  ____________________________________________  Time seen: Approximately 6:56 PM  I have reviewed the triage vital signs and the nursing notes.   HISTORY  Chief Complaint No chief complaint on file.    HPI Belinda Garcia is a 66 y.o. female who reports suicidal ideation with a plan to shoot herself in the head. She reports feeling depressed, worsening for the past 6 months with anhedonia and insomnia and loss of appetite. She blames this on her chronic pain and feeling hopeless about ever getting out of pain. No acute medical complaints. Has not taken any ingestion or done anything to hurt herself yet today. Denies drug use. Drinks 4 beers today. Not a daily drinker.     Past Medical History:  Diagnosis Date  . Anxiety   . Diabetes mellitus without complication ([place])   . GERD (gastroesophageal reflux disease)   . Hiatal hernia   . High cholesterol   . Hypertension   . Thyroid disease      There are no active problems to display for this patient.    Past Surgical History:  Procedure Laterality Date  . ABDOMINAL HYSTERECTOMY    . CHOLECYSTECTOMY       Prior to Admission medications   Medication Sig Start Date End Date Taking? Authorizing Provider  amLODipine (NORVASC) 10 MG tablet Take 5 mg by mouth 2 (two) times daily.    [provider]  aspirin-acetaminophen-caffeine (EXCEDRIN MIGRAINE) [phone] MG tablet Take 2 tablets by mouth every 6 (six) hours as needed for migraine.    [provider]  atorvastatin (LIPITOR) 40 MG tablet Take 40 mg by mouth at bedtime.    [provider]  cholecalciferol (VITAMIN D) 1000 units tablet Take 1,000 Units by mouth daily.    [provider]  cyclobenzaprine (FLEXERIL) 10 MG tablet Take 10 mg by mouth 3 (three) times daily as needed for muscle spasms.    [provider]  escitalopram (LEXAPRO) 10 MG tablet Take 10 mg  by mouth daily.    [provider]  levothyroxine (SYNTHROID, LEVOTHROID) 125 MCG tablet Take 125 mcg by mouth daily before breakfast.    [provider]  losartan-hydrochlorothiazide (HYZAAR) 100-25 MG tablet Take 1 tablet by mouth daily.    [provider]  meclizine (ANTIVERT) 25 MG tablet Take 1 tablet (25 mg total) by mouth 3 (three) times daily as needed for dizziness. 03/24/16   Hinda Kehr, MD  meloxicam (MOBIC) 15 MG tablet Take 15 mg by mouth daily.    [provider]  metoprolol succinate (TOPROL-XL) 25 MG 24 hr tablet Take 25 mg by mouth daily.    [provider]  montelukast (SINGULAIR) 10 MG tablet Take 10 mg by mouth at bedtime.    [provider]  ondansetron (ZOFRAN ODT) 4 MG disintegrating tablet Allow 1-2 tablets to dissolve in your mouth every 8 hours as needed for nausea/vomiting 03/24/16   Hinda Kehr, MD  oxyCODONE-acetaminophen (PERCOCET) 10-325 MG tablet Take 1 tablet by mouth every 4 (four) hours as needed for pain.    [provider]  pregabalin (LYRICA) 75 MG capsule Take 75 mg by mouth 2 (two) times daily.    [provider]  traZODone (DESYREL) 100 MG tablet Take 200 mg by mouth at bedtime.    [provider]     Allergies Patient has no known allergies.   No family history on file.  Social History Social History  Substance Use Topics  . Smoking status: Current Some Day Smoker    Types: Cigarettes  . Smokeless tobacco: Never Used  . Alcohol use No    Review of Systems  Constitutional:   No fever or chills.  ENT:   No sore throat. No rhinorrhea. Cardiovascular:   No chest pain or syncope. Respiratory:   No dyspnea or cough. Gastrointestinal:   Negative for abdominal pain, vomiting and diarrhea.  Musculoskeletal:   Negative for focal pain or swelling All other systems reviewed and are negative except as documented above in ROS and  HPI.  ____________________________________________   PHYSICAL EXAM:  VITAL SIGNS: ED Triage Vitals [04/02/17 1843]  Enc Vitals Group     BP 119/76     Pulse Rate 81     Resp 18     Temp 98.2 F (36.8 C)     Temp Source Oral     SpO2 94 %     Weight 205 lb (93 kg)     Height 5\' 5"  (1.651 m)     Head Circumference      Peak Flow      Pain Score      Pain Loc      Pain Edu?      Excl. in [place]?     Vital signs reviewed, nursing assessments reviewed.   Constitutional:   Alert and oriented. Well appearing and in no distress. Eyes:   No scleral icterus.  EOMI. No nystagmus. No conjunctival pallor. PERRL. ENT   Head:   Normocephalic and atraumatic.   Nose:   No congestion/rhinnorhea.    Mouth/Throat:   MMM, no pharyngeal erythema. No peritonsillar mass.    Neck:   No meningismus. Full ROM Hematological/Lymphatic/Immunilogical:   No cervical lymphadenopathy. Cardiovascular:   RRR. Symmetric bilateral radial and DP pulses.  No murmurs.  Respiratory:   Normal respiratory effort without tachypnea/retractions. Breath sounds are clear and equal bilaterally. No wheezes/rales/rhonchi. Gastrointestinal:   Soft and nontender. Non distended. There is no CVA tenderness.  No rebound, rigidity, or guarding. Genitourinary:   deferred Musculoskeletal:   Normal range of motion in all extremities. No joint effusions.  No lower extremity tenderness.  No edema. Neurologic:   Normal speech and language.  Motor grossly intact. No gross focal neurologic deficits are appreciated.  Psychiatric: Depressed affect, suicidal thoughts Linear thought process Skin:    Skin is warm, dry and intact. No rash noted.  No petechiae, purpura, or bullae. No wounds  ____________________________________________    LABS (pertinent positives/negatives) (all labs ordered are listed, but only abnormal results are displayed) Labs Reviewed  URINE CULTURE  ACETAMINOPHEN LEVEL  COMPREHENSIVE METABOLIC  PANEL  ETHANOL  LIPASE, BLOOD  SALICYLATE LEVEL  CBC WITH DIFFERENTIAL/PLATELET  URINALYSIS, COMPLETE (UACMP) WITH MICROSCOPIC  URINE DRUG SCREEN, QUALITATIVE ([facility] ONLY)   ____________________________________________   EKG    ____________________________________________    RADIOLOGY  No results found.  ____________________________________________   PROCEDURES Procedures  ____________________________________________   INITIAL IMPRESSION / ASSESSMENT AND PLAN / ED COURSE  Pertinent labs & imaging results that were available during my care of the patient were reviewed by me and considered in my medical decision making (see chart for details).  Patient well appearing at in distress but symptoms consistent with major depression, currently severe with plan to kill herself with a firearm. Patient denies any ingestion and there no evidence of any current injuries. All began an involuntary commitment hold and obtain psychiatry consultation for further management. Continue  her home medicines for now. No acute medical complaints. Has chronic back pain and right knee pain unchanged.      ____________________________________________   FINAL CLINICAL IMPRESSION(S) / ED DIAGNOSES  Final diagnoses:  Severe episode of recurrent major depressive disorder, without psychotic features ([place])      New Prescriptions   No medications on file     Portions of this note were generated with dragon dictation software. Dictation errors may occur despite best attempts at proofreading.    Carrie Mew, MD 04/02/17 1901

## 2017-04-03 NOTE — ED Notes (Addendum)
I have attempted to get verification from TTS but I have not gotten an answer on the [phone] phone this am    [place] Patient sleeping: Yes.   Patient alert and oriented: eyes closed  Appears to be asleep Behavior appropriate: Yes.  ; If no, describe:  Nutrition and fluids offered: Yes  Toileting and hygiene offered: sleeping Sitter present: q 15 minute observations and security monitoring Law enforcement present: yes  ODS  ENVIRONMENTAL ASSESSMENT Potentially harmful objects out of patient reach: Yes.   Personal belongings secured: Yes.   Patient dressed in hospital provided attire only: Yes.   Plastic bags out of patient reach: Yes.   Patient care equipment (cords, cables, call bells, lines, and drains) shortened, removed, or accounted for: Yes.   Equipment and supplies removed from bottom of stretcher: Yes.   Potentially toxic materials out of patient reach: Yes.   Sharps container removed or out of patient reach: Yes.

## 2017-04-03 NOTE — ED Notes (Addendum)
Spoke with my management - laura/greg  We discussed the pending transfer to [facility] and the last TTS note reports that the pt is to transfer to [facility]    We did not have access to [employer] phone number to get clarification  - charge RN in [place] states "no cane"   I was advised to call nursing supervisor    Patient observed lying in bed with eyes closed  Even, unlabored respirations observed   NAD pt appears to be sleeping  I will continue to monitor along with every 15 minute visual observations and ongoing security monitoring

## 2017-04-03 NOTE — ED Notes (Signed)
Pt sleeping at this time, pt lying on her left side. Audible snoring can be heard, oxygen is still on pt. Pt's O2 is WNL at 94% with 2L.

## 2017-04-03 NOTE — ED Notes (Addendum)
While checking pt's VS, pt's oxygen level was 88% on RA. This RN asked pt if she wears oxygen at home, CPAP or has hx of sleep apnea, pt states "I don't wear it." This RN asked pt if she is supposed to wear oxygen and pt states "yes." EDP notified of this, pt placed on 2L of oxygen per verbal order; officer and rover notified of this as well. Pt's oxygen is 95% at this time on 2L.

## 2017-04-03 NOTE — ED Notes (Signed)
Attempted to call [phone] - TTS but I cannot get an answer   I talked with charge - Theadora Rama   I called [phone] - spoke with Leafy Ro  - told her the situation about the pt being accepted and has a ready bed at [facility] but rumor states that she may be admitted to [facility] -  I informed Leafy Ro that the pt requires a cane with ambulation and she reported that a cane should not be a problem - she then transferred me to [place] reported that if the pt has a bed available at [facility] then I should send her to [facility] and that a cane is not allowed downstairs

## 2017-04-03 NOTE — ED Notes (Signed)
Called transport for [facility] per [employer]  [phone] as after several calls to various staff and determined she would be transferred to [facility].

## 2017-04-03 NOTE — ED Notes (Signed)
ED  Is the patient under IVC or is there intent for IVC: Yes.   Is the patient medically cleared: Yes.   Is there vacancy in the ED BHU: Yes.   Is the population mix appropriate for patient: she walks with a cane Is the patient awaiting placement in inpatient or outpatient setting:  Accepted to [facility] the patient had a psychiatric consult: Yes. SOC    Survey of unit performed for contraband, proper placement and condition of furniture, tampering with fixtures in bathroom, shower, and each patient room: Yes.  ; Findings:  APPEARANCE/BEHAVIOR Calm and cooperative NEURO ASSESSMENT Orientation: oriented x3   Hallucinations: No.None noted (Hallucinations) Speech: Normal Gait: normal - unsteady at times  RESPIRATORY ASSESSMENT Even  Unlabored respirations  CARDIOVASCULAR ASSESSMENT Pulses equal   regular rate  Skin warm and dry   GASTROINTESTINAL ASSESSMENT no GI complaint EXTREMITIES Full ROM  PLAN OF CARE Provide calm/safe environment. Vital signs assessed twice daily. ED BHU Assessment once each 12-hour shift. Collaborate with TTS daily or as condition indicates. Assure the ED provider has rounded once each shift. Provide and encourage hygiene. Provide redirection as needed. Assess for escalating behavior; address immediately and inform ED provider.  Assess family dynamic and appropriateness for visitation as needed: Yes.  ; If necessary, describe findings:  Educate the patient/family about BHU procedures/visitation: Yes.  ; If necessary, describe findings:

## 2017-04-03 NOTE — ED Notes (Signed)
Received a call from [facility] stating that this pt will not be able to transfer to [place] because a BHU RN reported to charge that the pt may be able to go to [facility]   I attempted to call TTS

## 2017-04-03 NOTE — ED Notes (Signed)
Pt awaked so that she may take am meds prior to transporting and go to the BR   She reports 7/10 pain in her knees  "my knees are always sore in the mornings"     Assessment completed  She ambulated to and from the BR with her cane

## 2017-04-03 NOTE — ED Notes (Signed)
I called jackie - nursing supervisor  - I discussed the situation/pending transfer of this pt and what is the best outcome

## 2017-04-03 NOTE — ED Notes (Signed)
Received a call back from the nursing supervisor and she verbalized  "send the pt to the accepting facility"  I verified that she read the TTS note from last pm and that we will be transferring this pt to the appropriate and closest facility

## 2017-04-03 NOTE — ED Notes (Signed)
Patient observed lying in bed with eyes closed  Even, unlabored respirations observed   NAD pt appears to be sleeping  I will continue to monitor along with every 15 minute visual observations and ongoing security monitoring    

## 2017-04-03 NOTE — ED Notes (Signed)
Called transport for [facility] per [place]

## 2017-04-03 NOTE — ED Notes (Signed)
Informed by Charge RN, Theadora Rama, that  Joycelyn Schmid from [facility] called and stated patient was possibly being admitted to Behavior Medicine in-house and to hold off calling for transport to [facility]   [phone]

## 2017-04-03 NOTE — ED Notes (Signed)
I called TTS in [place] and spoke with Cyril Mourning - I told her the situation and the desire to not delay treat ment for this pt she informed me that we do no have TTS today and that she cannot really give me advice so I should contact my supervisor

## 2017-04-03 NOTE — ED Notes (Signed)
She transferred to [facility] via [facility] - plan of care discussed with her during assessment and she verbalized agreement and understanding   All belongings transferred with the transporter

## 2017-04-03 NOTE — ED Notes (Signed)
Called transport for [facility] per [place]

## 2017-04-03 NOTE — ED Notes (Signed)
I have tried to call TTS but received no answer  I have reviewed the chart and the only dispo note that I see is from [place] at 2331 pt has been accepted to [facility] and may transfer after 0900

## 2017-04-04 LAB — URINE CULTURE

## 2017-06-09 ENCOUNTER — Encounter: Payer: Self-pay | Admitting: *Deleted

## 2017-06-10 ENCOUNTER — Encounter: Payer: Self-pay | Admitting: *Deleted

## 2017-06-10 ENCOUNTER — Ambulatory Visit
Admission: RE | Admit: 2017-06-10 | Discharge: 2017-06-10 | Disposition: A | Discharge status: Home / Self Care | Payer: Medicare Other | Source: Ambulatory Visit | Attending: Unknown Physician Specialty | Admitting: Unknown Physician Specialty

## 2017-06-10 ENCOUNTER — Ambulatory Visit: Payer: Medicare Other | Admitting: Anesthesiology

## 2017-06-10 ENCOUNTER — Encounter
Admission: RE | Disposition: A | Discharge status: Home / Self Care | Payer: Self-pay | Source: Ambulatory Visit | Attending: Unknown Physician Specialty

## 2017-06-10 DIAGNOSIS — K449 Diaphragmatic hernia without obstruction or gangrene: Secondary | ICD-10-CM | POA: Diagnosis not present

## 2017-06-10 DIAGNOSIS — M797 Fibromyalgia: Secondary | ICD-10-CM | POA: Diagnosis not present

## 2017-06-10 DIAGNOSIS — K222 Esophageal obstruction: Secondary | ICD-10-CM | POA: Diagnosis not present

## 2017-06-10 DIAGNOSIS — F419 Anxiety disorder, unspecified: Secondary | ICD-10-CM | POA: Diagnosis not present

## 2017-06-10 DIAGNOSIS — F17210 Nicotine dependence, cigarettes, uncomplicated: Secondary | ICD-10-CM | POA: Diagnosis not present

## 2017-06-10 DIAGNOSIS — Z86711 Personal history of pulmonary embolism: Secondary | ICD-10-CM | POA: Insufficient documentation

## 2017-06-10 DIAGNOSIS — K640 First degree hemorrhoids: Secondary | ICD-10-CM | POA: Diagnosis not present

## 2017-06-10 DIAGNOSIS — I10 Essential (primary) hypertension: Secondary | ICD-10-CM | POA: Insufficient documentation

## 2017-06-10 DIAGNOSIS — E079 Disorder of thyroid, unspecified: Secondary | ICD-10-CM | POA: Insufficient documentation

## 2017-06-10 DIAGNOSIS — K219 Gastro-esophageal reflux disease without esophagitis: Secondary | ICD-10-CM | POA: Insufficient documentation

## 2017-06-10 DIAGNOSIS — J449 Chronic obstructive pulmonary disease, unspecified: Secondary | ICD-10-CM | POA: Insufficient documentation

## 2017-06-10 DIAGNOSIS — Z7982 Long term (current) use of aspirin: Secondary | ICD-10-CM | POA: Diagnosis not present

## 2017-06-10 DIAGNOSIS — K2950 Unspecified chronic gastritis without bleeding: Secondary | ICD-10-CM | POA: Diagnosis not present

## 2017-06-10 DIAGNOSIS — Z1211 Encounter for screening for malignant neoplasm of colon: Secondary | ICD-10-CM | POA: Diagnosis present

## 2017-06-10 DIAGNOSIS — E7800 Pure hypercholesterolemia, unspecified: Secondary | ICD-10-CM | POA: Diagnosis not present

## 2017-06-10 DIAGNOSIS — Z86010 Personal history of colonic polyps: Secondary | ICD-10-CM | POA: Diagnosis not present

## 2017-06-10 DIAGNOSIS — M1990 Unspecified osteoarthritis, unspecified site: Secondary | ICD-10-CM | POA: Diagnosis not present

## 2017-06-10 DIAGNOSIS — Z7984 Long term (current) use of oral hypoglycemic drugs: Secondary | ICD-10-CM | POA: Diagnosis not present

## 2017-06-10 DIAGNOSIS — E119 Type 2 diabetes mellitus without complications: Secondary | ICD-10-CM | POA: Insufficient documentation

## 2017-06-10 DIAGNOSIS — Z79899 Other long term (current) drug therapy: Secondary | ICD-10-CM | POA: Insufficient documentation

## 2017-06-10 HISTORY — DX: Chronic obstructive pulmonary disease, unspecified: J44.9

## 2017-06-10 HISTORY — DX: Allergic rhinitis, unspecified: J30.9

## 2017-06-10 HISTORY — DX: Unspecified osteoarthritis, unspecified site: M19.90

## 2017-06-10 HISTORY — DX: Unspecified asthma, uncomplicated: J45.909

## 2017-06-10 HISTORY — DX: Fibromyalgia: M79.7

## 2017-06-10 HISTORY — DX: Other pulmonary embolism without acute cor pulmonale: I26.99

## 2017-06-10 HISTORY — DX: Cardiac murmur, unspecified: R01.1

## 2017-06-10 HISTORY — DX: Benign neoplasm of colon, unspecified: D12.6

## 2017-06-10 HISTORY — DX: Dorsalgia, unspecified: M54.9

## 2017-06-10 HISTORY — PX: ESOPHAGOGASTRODUODENOSCOPY (EGD) WITH PROPOFOL: SHX5813

## 2017-06-10 HISTORY — DX: Other chronic pain: G89.29

## 2017-06-10 HISTORY — PX: COLONOSCOPY WITH PROPOFOL: SHX5780

## 2017-06-10 LAB — GLUCOSE, CAPILLARY: Glucose-Capillary: 175 mg/dL — ABNORMAL HIGH (ref 65–99)

## 2017-06-10 SURGERY — COLONOSCOPY WITH PROPOFOL
Anesthesia: General

## 2017-06-10 MED ORDER — FENTANYL CITRATE (PF) 100 MCG/2ML IJ SOLN
INTRAMUSCULAR | Status: AC
  Filled 2017-06-10: qty 2

## 2017-06-10 MED ORDER — SODIUM CHLORIDE 0.9 % IV SOLN
INTRAVENOUS | Status: DC
  Administered 2017-06-10: 08:00:00 via INTRAVENOUS

## 2017-06-10 MED ORDER — BUTAMBEN-TETRACAINE-BENZOCAINE 2-2-14 % EX AERO
INHALATION_SPRAY | CUTANEOUS | Status: DC | PRN
  Administered 2017-06-10: 1 via TOPICAL

## 2017-06-10 MED ORDER — PROPOFOL 10 MG/ML IV BOLUS
INTRAVENOUS | Status: DC | PRN
  Administered 2017-06-10: 30 mg via INTRAVENOUS
  Administered 2017-06-10: 25 mg via INTRAVENOUS
  Administered 2017-06-10: 20 mg via INTRAVENOUS
  Administered 2017-06-10 (×2): 50 mg via INTRAVENOUS

## 2017-06-10 MED ORDER — LIDOCAINE HCL (CARDIAC) 20 MG/ML IV SOLN
INTRAVENOUS | Status: DC | PRN
  Administered 2017-06-10: 40 mg via INTRAVENOUS

## 2017-06-10 MED ORDER — PIPERACILLIN-TAZOBACTAM 3.375 G IVPB
INTRAVENOUS | Status: AC
  Administered 2017-06-10: 3.375 g via INTRAVENOUS
  Filled 2017-06-10: qty 50

## 2017-06-10 MED ORDER — BUTAMBEN-TETRACAINE-BENZOCAINE 2-2-14 % EX AERO
INHALATION_SPRAY | CUTANEOUS | Status: AC
  Filled 2017-06-10: qty 5

## 2017-06-10 MED ORDER — FENTANYL CITRATE (PF) 100 MCG/2ML IJ SOLN
INTRAMUSCULAR | Status: DC | PRN
  Administered 2017-06-10: 50 ug via INTRAVENOUS

## 2017-06-10 MED ORDER — PROPOFOL 500 MG/50ML IV EMUL
INTRAVENOUS | Status: DC | PRN
  Administered 2017-06-10: 125 ug/kg/min via INTRAVENOUS

## 2017-06-10 MED ORDER — PIPERACILLIN-TAZOBACTAM 3.375 G IVPB 30 MIN
3.3750 g | Freq: Once | INTRAVENOUS | Status: AC
  Administered 2017-06-10: 3.375 g via INTRAVENOUS
  Filled 2017-06-10: qty 50

## 2017-06-10 MED ORDER — SODIUM CHLORIDE 0.9 % IV SOLN: INTRAVENOUS | Status: DC

## 2017-06-10 NOTE — Op Note (Signed)
[facility] Gastroenterology Patient Name: Belinda Garcia Procedure Date: 06/10/2017 8:59 AM MRN: [id number] Account #: [id number] Date of Birth: October 17, 1950 Admit Type: Outpatient Age: 66 Room: [facility] ENDO ROOM 3 Gender: Female Note Status: Finalized Procedure:            Upper GI endoscopy Indications:          Dysphagia Providers:            [name], MD Referring MD:         Irven Easterly. Kary Kos, MD (Referring MD) Medicines:            Propofol per Anesthesia Complications:        No immediate complications. Procedure:            Pre-Anesthesia Assessment:                       - After reviewing the risks and benefits, the patient                        was deemed in satisfactory condition to undergo the                        procedure.                       After obtaining informed consent, the endoscope was                        passed under direct vision. Throughout the procedure,                        the patient's blood pressure, pulse, and oxygen                        saturations were monitored continuously. The Endoscope                        was introduced through the mouth, and advanced to the                        second part of duodenum. The upper GI endoscopy was                        accomplished without difficulty. The patient tolerated                        the procedure well. Findings:      Diffuse mild mucosal changes characterized by granularity were found in       the middle third of the esophagus. Biopsies were taken with a cold       forceps for histology.      A mild Schatzki ring (acquired) was found at the gastroesophageal       junction. At the endo of the procedure A guidewire was placed and the       scope was withdrawn. Dilation was performed with a Savary dilator with       no resistance at 16 mm.      Diffuse mild inflammation characterized by granularity was found in the       gastric body. Biopsies were taken with a  cold forceps for histology.  Biopsies were taken with a cold forceps for Helicobacter pylori testing.      The examined duodenum was normal. Impression:           - Granular mucosa in the esophagus. Biopsied.                       - Mild Schatzki ring. Dilated.                       - Gastritis. Biopsied.                       - Normal examined duodenum. Recommendation:       - Await pathology results. [name], MD 06/10/2017 9:23:53 AM This report has been signed electronically. Number of Addenda: 0 Note Initiated On: 06/10/2017 8:59 AM      [facility]

## 2017-06-10 NOTE — Transfer of Care (Signed)
Immediate Anesthesia Transfer of Care Note  Patient: Belinda Garcia  Procedure(s) Performed: Procedure(s): COLONOSCOPY WITH PROPOFOL (N/A) ESOPHAGOGASTRODUODENOSCOPY (EGD) WITH PROPOFOL (N/A)  Patient Location: PACU and Endoscopy Unit  Anesthesia Type:General  Level of Consciousness: awake  Airway & Oxygen Therapy: Patient Spontanous Breathing  Post-op Assessment: Report given to RN  Post vital signs: stable  Last Vitals:  Vitals:   06/10/17 0757  BP: 135/63  Pulse: 64  Resp: 18  Temp: 36.6 C  SpO2: 98%    Last Pain:  Vitals:   06/10/17 0757  TempSrc: Tympanic  PainSc: 4       Patients Stated Pain Goal: 4 (14/23/95 3202)  Complications: No apparent anesthesia complications

## 2017-06-10 NOTE — Anesthesia Post-op Follow-up Note (Signed)
Anesthesia QCDR form completed.        

## 2017-06-10 NOTE — Anesthesia Preprocedure Evaluation (Addendum)
Anesthesia Evaluation  Patient identified by MRN, date of birth, ID band Patient awake    Reviewed: Allergy & Precautions, NPO status , Patient's Chart, lab work & pertinent test results, reviewed documented beta blocker date and time   Airway Mallampati: IV  TM Distance: <3 FB     Dental  (+) Caps, Chipped   Pulmonary asthma , COPD,  COPD inhaler, Current Smoker, PE   Pulmonary exam normal        Cardiovascular hypertension, Pt. on medications and Pt. on home beta blockers Normal cardiovascular exam+ Valvular Problems/Murmurs      Neuro/Psych Anxiety    GI/Hepatic hiatal hernia, GERD  Medicated,  Endo/Other  diabetes, Well Controlled, Type 2, Oral Hypoglycemic AgentsHypothyroidism   Renal/GU      Musculoskeletal  (+) Arthritis , Osteoarthritis,  Fibromyalgia -  Abdominal Normal abdominal exam  (+)   Peds  Hematology negative hematology ROS (+)   Anesthesia Other Findings   Reproductive/Obstetrics                            Anesthesia Physical Anesthesia Plan  ASA: III  Anesthesia Plan: General   Post-op Pain Management:    Induction: Intravenous  PONV Risk Score and Plan:   Airway Management Planned: Nasal Cannula  Additional Equipment:   Intra-op Plan:   Post-operative Plan:   Informed Consent: I have reviewed the patients History and Physical, chart, labs and discussed the procedure including the risks, benefits and alternatives for the proposed anesthesia with the patient or authorized representative who has indicated his/her understanding and acceptance.   Dental advisory given  Plan Discussed with: CRNA and Surgeon  Anesthesia Plan Comments:         Anesthesia Quick Evaluation

## 2017-06-10 NOTE — Op Note (Signed)
[facility] Gastroenterology Patient Name: Belinda Garcia Procedure Date: 06/10/2017 8:58 AM MRN: [id number] Account #: [id number] Date of Birth: 1951/06/06 Admit Type: Outpatient Age: 66 Room: [facility] ENDO ROOM 3 Gender: Female Note Status: Finalized Procedure:            Colonoscopy Indications:          High risk colon cancer surveillance: Personal history                        of colonic polyps Providers:            [name], MD Referring MD:         Irven Easterly. Kary Kos, MD (Referring MD) Medicines:            Propofol per Anesthesia Complications:        No immediate complications. Procedure:            Pre-Anesthesia Assessment:                       - After reviewing the risks and benefits, the patient                        was deemed in satisfactory condition to undergo the                        procedure.                       After obtaining informed consent, the colonoscope was                        passed under direct vision. Throughout the procedure,                        the patient's blood pressure, pulse, and oxygen                        saturations were monitored continuously. The                        Colonoscope was introduced through the anus and                        advanced to the the cecum, identified by appendiceal                        orifice and ileocecal valve. The colonoscopy was                        performed without difficulty. The patient tolerated the                        procedure well. The quality of the bowel preparation                        was excellent. Findings:      Internal hemorrhoids were found during endoscopy. The hemorrhoids were       small and Grade I (internal hemorrhoids that do not prolapse).      The exam was otherwise without abnormality. Impression:           -  Internal hemorrhoids.                       - The examination was otherwise normal.                       - No specimens  collected. Recommendation:       - Repeat colonoscopy in 5 years for surveillance. [name], MD 06/10/2017 9:47:00 AM This report has been signed electronically. Number of Addenda: 0 Note Initiated On: 06/10/2017 8:58 AM Scope Withdrawal Time: 0 hours 12 minutes 28 seconds  Total Procedure Duration: 0 hours 17 minutes 8 seconds       [facility]

## 2017-06-10 NOTE — Anesthesia Postprocedure Evaluation (Signed)
Anesthesia Post Note  Patient: Belinda Garcia  Procedure(s) Performed: Procedure(s) (LRB): COLONOSCOPY WITH PROPOFOL (N/A) ESOPHAGOGASTRODUODENOSCOPY (EGD) WITH PROPOFOL (N/A)  Patient location during evaluation: PACU Anesthesia Type: General Level of consciousness: awake and alert and oriented Pain management: pain level controlled Vital Signs Assessment: post-procedure vital signs reviewed and stable Respiratory status: spontaneous breathing Cardiovascular status: blood pressure returned to baseline Anesthetic complications: no     Last Vitals:  Vitals:   06/10/17 0959 06/10/17 1019  BP: 123/63 136/72  Pulse: 64 (!) 49  Resp: 14 20  Temp:    SpO2: 97% 98%    Last Pain:  Vitals:   06/10/17 0757  TempSrc: Tympanic  PainSc: 4                  Leiani Enright

## 2017-06-10 NOTE — H&P (Signed)
Primary Care Physician:  Maryland Pink, MD Primary Gastroenterologist:  Dr. Vira Agar  Pre-Procedure History & Physical: HPI:  Belinda Garcia is a 66 y.o. female is here for an endoscopy and colonoscopy.   Past Medical History:  Diagnosis Date  . Adenomatous colon polyp   . Allergic rhinitis   . Anxiety   . Arthritis    osteoarthritis  . Asthma   . Back pain, chronic   . COPD (chronic obstructive pulmonary disease) ([place])   . Diabetes mellitus without complication ([place])   . Fibromyalgia   . GERD (gastroesophageal reflux disease)   . Heart murmur   . Hiatal hernia   . High cholesterol   . Hypertension   . Pulmonary emboli ([place])   . Thyroid disease     Past Surgical History:  Procedure Laterality Date  . ABDOMINAL HYSTERECTOMY    . CARDIAC CATHETERIZATION    . CHOLECYSTECTOMY    . COLONOSCOPY    . ESOPHAGOGASTRODUODENOSCOPY    . PERIPHERAL VASCULAR THROMBECTOMY Right   . SPLENECTOMY      Prior to Admission medications   Medication Sig Start Date End Date Taking? Authorizing Provider  ALPRAZolam Duanne Moron) 0.5 MG tablet Take 0.5 mg by mouth 3 (three) times daily as needed for anxiety.   Yes [provider]  amLODipine (NORVASC) 10 MG tablet Take 5 mg by mouth 2 (two) times daily.   Yes [provider]  aspirin EC 81 MG tablet Take 81 mg by mouth daily.   Yes [provider]  aspirin-acetaminophen-caffeine (EXCEDRIN MIGRAINE) [phone] MG tablet Take 2 tablets by mouth every 6 (six) hours as needed for migraine.   Yes [provider]  atorvastatin (LIPITOR) 40 MG tablet Take 40 mg by mouth at bedtime.   Yes [provider]  cholecalciferol (VITAMIN D) 1000 units tablet Take 1,000 Units by mouth daily.   Yes [provider]  citalopram (CELEXA) 40 MG tablet Take 40 mg by mouth daily.   Yes [provider]  cyclobenzaprine (FLEXERIL) 10 MG tablet Take 10 mg by mouth 3 (three) times daily as needed for muscle  spasms.   Yes [provider]  diazepam (VALIUM) 10 MG tablet Take 10 mg by mouth at bedtime as needed for anxiety.   Yes [provider]  diclofenac sodium (VOLTAREN) 1 % GEL Apply 2 g topically 4 (four) times daily.   Yes [provider]  DULoxetine (CYMBALTA) 30 MG capsule Take 30 mg by mouth daily.   Yes [provider]  escitalopram (LEXAPRO) 10 MG tablet Take 10 mg by mouth daily.   Yes [provider]  esomeprazole (NEXIUM) 40 MG capsule Take 40 mg by mouth 2 (two) times daily.   Yes [provider]  Fluticasone-Salmeterol (ADVAIR) 250-50 MCG/DOSE AEPB Inhale 1 puff into the lungs 2 (two) times daily.   Yes [provider]  glipiZIDE-metformin (METAGLIP) 2.5-500 MG tablet Take 1 tablet by mouth 2 (two) times daily before a meal.   Yes [provider]  Ipratropium-Albuterol (COMBIVENT IN) Inhale 2 puffs into the lungs 4 (four) times daily.   Yes [provider]  levothyroxine (SYNTHROID, LEVOTHROID) 125 MCG tablet Take 125 mcg by mouth daily before breakfast.   Yes [provider]  losartan-hydrochlorothiazide (HYZAAR) 100-25 MG tablet Take 1 tablet by mouth daily.   Yes [provider]  meloxicam (MOBIC) 15 MG tablet Take 15 mg by mouth daily.   Yes [provider]  metaxalone [facility])  800 MG tablet Take 800 mg by mouth 3 (three) times daily.   Yes [provider]  metoprolol succinate (TOPROL-XL) 25 MG 24 hr tablet Take 25 mg by mouth daily.   Yes [provider]  montelukast (SINGULAIR) 10 MG tablet Take 10 mg by mouth at bedtime.   Yes [provider]  nitroGLYCERIN (NITROSTAT) 0.4 MG SL tablet Place 0.4 mg under the tongue every 5 (five) minutes as needed for chest pain.   Yes [provider]  ondansetron (ZOFRAN ODT) 4 MG disintegrating tablet Allow 1-2 tablets to dissolve in your mouth every 8 hours as needed for nausea/vomiting 03/24/16  Yes  Hinda Kehr, MD  oxyCODONE-acetaminophen (PERCOCET) 10-325 MG tablet Take 1 tablet by mouth every 4 (four) hours as needed for pain.   Yes [provider]  potassium chloride (K-DUR,KLOR-CON) 10 MEQ tablet Take 10 mEq by mouth 2 (two) times daily.   Yes [provider]  pregabalin (LYRICA) 75 MG capsule Take 75 mg by mouth 2 (two) times daily.   Yes [provider]  traZODone (DESYREL) 100 MG tablet Take 200 mg by mouth at bedtime.   Yes [provider]  meclizine (ANTIVERT) 25 MG tablet Take 1 tablet (25 mg total) by mouth 3 (three) times daily as needed for dizziness. 03/24/16   Hinda Kehr, MD    Allergies as of 03/11/2017  . (No Known Allergies)    History reviewed. No pertinent family history.  Social History   Social History  . Marital status: Married    Spouse name: N/A  . Number of children: N/A  . Years of education: N/A   Occupational History  . Not on file.   Social History Main Topics  . Smoking status: Current Some Day Smoker    Packs/day: 0.25    Years: 15.00    Types: Cigarettes  . Smokeless tobacco: Never Used  . Alcohol use Yes     Comment: 2 beers a month  . Drug use: No  . Sexual activity: Not on file   Other Topics Concern  . Not on file   Social History Narrative  . No narrative on file    Review of Systems: See HPI, otherwise negative ROS  Physical Exam: BP 135/63   Pulse 64   Temp 97.8 F (36.6 C) (Tympanic)   Resp 18   Ht 5\' 5"  (1.651 m)   Wt 93 kg (205 lb)   SpO2 98%   BMI 34.11 kg/m  General:   Alert,  pleasant and cooperative in NAD Head:  Normocephalic and atraumatic. Neck:  Supple; no masses or thyromegaly. Lungs:  Clear throughout to auscultation.    Heart:  Regular rate and rhythm. Abdomen:  Soft, nontender and nondistended. Normal bowel sounds, without guarding, and without rebound.   Neurologic:  Alert and  oriented x4;  grossly normal neurologically.  Impression/Plan: Belinda Garcia is here for an endoscopy and colonoscopy to be performed for dysphagia and PH colon polyps  Risks, benefits, limitations, and alternatives regarding  endoscopy and colonoscopy have been reviewed with the patient.  Questions have been answered.  All parties agreeable.   Gaylyn Cheers, MD  06/10/2017, 8:51 AM

## 2017-06-11 ENCOUNTER — Encounter: Payer: Self-pay | Admitting: Unknown Physician Specialty

## 2017-06-11 LAB — SURGICAL PATHOLOGY

## 2017-06-15 ENCOUNTER — Other Ambulatory Visit: Payer: Self-pay | Admitting: Obstetrics & Gynecology

## 2017-06-15 DIAGNOSIS — Z1231 Encounter for screening mammogram for malignant neoplasm of breast: Secondary | ICD-10-CM

## 2017-07-23 ENCOUNTER — Ambulatory Visit
Admission: RE | Admit: 2017-07-23 | Discharge: 2017-07-23 | Disposition: A | Discharge status: Home / Self Care | Payer: Medicare Other | Source: Ambulatory Visit | Attending: Obstetrics & Gynecology | Admitting: Obstetrics & Gynecology

## 2017-07-23 DIAGNOSIS — Z1231 Encounter for screening mammogram for malignant neoplasm of breast: Secondary | ICD-10-CM | POA: Diagnosis not present

## 2017-07-24 ENCOUNTER — Encounter: Payer: Self-pay | Admitting: Obstetrics & Gynecology

## 2017-08-03 ENCOUNTER — Telehealth: Payer: Self-pay

## 2017-08-03 NOTE — Telephone Encounter (Signed)
Pt calling for results of mammogram done on 10/18th.  Adv negative.

## 2018-01-25 ENCOUNTER — Encounter: Payer: Self-pay | Admitting: Medical Oncology

## 2018-01-25 ENCOUNTER — Emergency Department: Payer: Medicare Other

## 2018-01-25 ENCOUNTER — Emergency Department
Admission: EM | Admit: 2018-01-25 | Discharge: 2018-01-25 | Disposition: A | Discharge status: Home / Self Care | Payer: Medicare Other | Attending: Emergency Medicine | Admitting: Emergency Medicine

## 2018-01-25 DIAGNOSIS — Z7984 Long term (current) use of oral hypoglycemic drugs: Secondary | ICD-10-CM | POA: Diagnosis not present

## 2018-01-25 DIAGNOSIS — R0789 Other chest pain: Secondary | ICD-10-CM | POA: Diagnosis not present

## 2018-01-25 DIAGNOSIS — E119 Type 2 diabetes mellitus without complications: Secondary | ICD-10-CM | POA: Insufficient documentation

## 2018-01-25 DIAGNOSIS — Z7982 Long term (current) use of aspirin: Secondary | ICD-10-CM | POA: Diagnosis not present

## 2018-01-25 DIAGNOSIS — Z79899 Other long term (current) drug therapy: Secondary | ICD-10-CM | POA: Diagnosis not present

## 2018-01-25 DIAGNOSIS — R1013 Epigastric pain: Secondary | ICD-10-CM | POA: Diagnosis present

## 2018-01-25 DIAGNOSIS — I10 Essential (primary) hypertension: Secondary | ICD-10-CM | POA: Insufficient documentation

## 2018-01-25 DIAGNOSIS — F419 Anxiety disorder, unspecified: Secondary | ICD-10-CM | POA: Insufficient documentation

## 2018-01-25 DIAGNOSIS — J45909 Unspecified asthma, uncomplicated: Secondary | ICD-10-CM | POA: Diagnosis not present

## 2018-01-25 DIAGNOSIS — K8020 Calculus of gallbladder without cholecystitis without obstruction: Secondary | ICD-10-CM | POA: Diagnosis not present

## 2018-01-25 DIAGNOSIS — Z9049 Acquired absence of other specified parts of digestive tract: Secondary | ICD-10-CM | POA: Diagnosis not present

## 2018-01-25 DIAGNOSIS — F17210 Nicotine dependence, cigarettes, uncomplicated: Secondary | ICD-10-CM | POA: Insufficient documentation

## 2018-01-25 LAB — BASIC METABOLIC PANEL
Anion gap: 11 (ref 5–15)
BUN: 11 mg/dL (ref 6–20)
CO2: 28 mmol/L (ref 22–32)
CREATININE: 0.66 mg/dL (ref 0.44–1.00)
Calcium: 11.2 mg/dL — ABNORMAL HIGH (ref 8.9–10.3)
Chloride: 95 mmol/L — ABNORMAL LOW (ref 101–111)
Glucose, Bld: 152 mg/dL — ABNORMAL HIGH (ref 65–99)
POTASSIUM: 4.5 mmol/L (ref 3.5–5.1)
SODIUM: 134 mmol/L — AB (ref 135–145)

## 2018-01-25 LAB — APTT: aPTT: 26 seconds (ref 24–36)

## 2018-01-25 LAB — HEPATIC FUNCTION PANEL
ALK PHOS: 112 U/L (ref 38–126)
ALT: 39 U/L (ref 14–54)
AST: 38 U/L (ref 15–41)
Albumin: 4.1 g/dL (ref 3.5–5.0)
BILIRUBIN DIRECT: 0.2 mg/dL (ref 0.1–0.5)
BILIRUBIN INDIRECT: 0.6 mg/dL (ref 0.3–0.9)
BILIRUBIN TOTAL: 0.8 mg/dL (ref 0.3–1.2)
Total Protein: 7.1 g/dL (ref 6.5–8.1)

## 2018-01-25 LAB — TROPONIN I
Troponin I: <0.03 ng/mL (ref <0.03)
Troponin I: <0.03 ng/mL (ref <0.03)

## 2018-01-25 LAB — CBC
HEMATOCRIT: 46.1 % (ref 35.0–47.0)
HEMOGLOBIN: 16 g/dL (ref 12.0–16.0)
MCH: 33 pg (ref 26.0–34.0)
MCHC: 34.7 g/dL (ref 32.0–36.0)
MCV: 95.1 fL (ref 80.0–100.0)
Platelets: 376 10*3/uL (ref 150–440)
RBC: 4.85 MIL/uL (ref 3.80–5.20)
RDW: 13.5 % (ref 11.5–14.5)
WBC: 14 10*3/uL — ABNORMAL HIGH (ref 3.6–11.0)

## 2018-01-25 LAB — LIPASE, BLOOD: Lipase: 26 U/L (ref 11–51)

## 2018-01-25 LAB — PROTIME-INR
INR: 0.91
Prothrombin Time: 12.2 seconds (ref 11.4–15.2)

## 2018-01-25 MED ORDER — GI COCKTAIL ~~LOC~~
30.0000 mL | Freq: Once | ORAL | Status: AC
  Administered 2018-01-25: 30 mL via ORAL
  Filled 2018-01-25: qty 30

## 2018-01-25 MED ORDER — IOPAMIDOL (ISOVUE-370) INJECTION 76%
100.0000 mL | Freq: Once | INTRAVENOUS | Status: AC | PRN
Start: 2018-01-25 — End: 2018-01-25
  Administered 2018-01-25: 100 mL via INTRAVENOUS

## 2018-01-25 NOTE — Discharge Instructions (Addendum)
You had the following findings on your CT scan: IMPRESSION: 1. No evidence of pulmonary embolism. 2. Coronary atherosclerosis with calcified plaque in the distribution of the LAD and left circumflex coronary arteries. 3. 9 mm rounded calcification just medial to cholecystectomy clips is felt to likely represent a retained calculus at the level of a cystic duct stump. 4. Multiple small peritoneal and omental soft tissue nodules with the largest adjacent to the right lobe of the liver. 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 However, other neoplastic process cannot be excluded. Prior CT would be helpful for comparison. If none are available, at least a follow-up abdomen and pelvic CT is recommended in roughly 2-3 months before deciding whether further workup would be necessary.  Return to the emergency room for any new or worrisome symptoms, including worsening chest pain change in her chronic chest pain fever, chills, vomiting, worsening abdominal pain, or other concerns.  Follow-up with Dr. Vira Agar for your gallbladder stone, to follow-up with Dr. [name] for your chest pain and the findings on CT, and follow-up with her primary care doctor for the questionable lymphadenopathy or lymph nodes in your abdomen.

## 2018-01-25 NOTE — ED Triage Notes (Signed)
Pt sent from Dr Charmaine Downs office for chest pain, EKG done in office and there was noted to be some changes from previous EKG's. Pt reports central chest squeezing pain that radiates into her back. Pt reports she feels sob also.

## 2018-01-25 NOTE — ED Provider Notes (Addendum)
[facility] Emergency Department Provider Note  ____________________________________________   I have reviewed the triage vital signs and the nursing notes. Where available I have reviewed prior notes and, if possible and indicated, outside hospital notes.    HISTORY  Chief Complaint Chest Pain    HPI Belinda Garcia is a 67 y.o. female with a history of anxiety, fibromyalgia, back pain, COPD, ongoing smoking, PEs remotely, who presents today with epigastric abdominal pain every day for a week, goes up towards her chest.  She denies any fever or chills.  He denies shortness of breath with it.  It is a nonspecific squeezing discomfort.  She went to Dr. [name], her cardiologist, and was told that her EKG showed no change.  Over they are concerned that she has been taking nitro glycerin every day for a week for this, patient states the pain is been there many days out of the week for months.  She has had she states a negative cath several years ago although I do not see those records, she denies any pleuritic pain, she denies any fever or chills.  She has had endoscopy and colonoscopies in the past for similar, she denies any melena or bright red blood per rectum or hematemesis.  She has not had an appetite today.  She will not see the pain is worse when she eats.  Certainly not exertional.  It is not worse with food she states although sometimes when she has that she does avoid eating.  She has had no antecedent intervention aside from her home nitroglycerin which has not helped much.  States she has frequent panic attacks but she states that this feels different from her frequent panic attacks as well.   Past Medical History:  Diagnosis Date  . Adenomatous colon polyp   . Allergic rhinitis   . Anxiety   . Arthritis    osteoarthritis  . Asthma   . Back pain, chronic   . COPD (chronic obstructive pulmonary disease) ([place])   . Diabetes mellitus without complication ([place])    . Fibromyalgia   . GERD (gastroesophageal reflux disease)   . Heart murmur   . Hiatal hernia   . High cholesterol   . Hypertension   . Pulmonary emboli ([place])   . Thyroid disease     There are no active problems to display for this patient.   Past Surgical History:  Procedure Laterality Date  . ABDOMINAL HYSTERECTOMY    . CARDIAC CATHETERIZATION    . CHOLECYSTECTOMY    . COLONOSCOPY    . COLONOSCOPY WITH PROPOFOL N/A 06/10/2017   Procedure: COLONOSCOPY WITH PROPOFOL;  Surgeon: [name], MD;  Location: [facility] ENDOSCOPY;  Service: Endoscopy;  Laterality: N/A;  . ESOPHAGOGASTRODUODENOSCOPY    . ESOPHAGOGASTRODUODENOSCOPY (EGD) WITH PROPOFOL N/A 06/10/2017   Procedure: ESOPHAGOGASTRODUODENOSCOPY (EGD) WITH PROPOFOL;  Surgeon: [name], MD;  Location: [facility] ENDOSCOPY;  Service: Endoscopy;  Laterality: N/A;  . PERIPHERAL VASCULAR THROMBECTOMY Right   . SPLENECTOMY      Prior to Admission medications   Medication Sig Start Date End Date Taking? Authorizing Provider  ALPRAZolam Duanne Moron) 0.5 MG tablet Take 0.5 mg by mouth 3 (three) times daily as needed for anxiety.    [provider]  amLODipine (NORVASC) 10 MG tablet Take 5 mg by mouth 2 (two) times daily.    [provider]  aspirin EC 81 MG tablet Take 81 mg by mouth daily.    [provider]  aspirin-acetaminophen-caffeine (EXCEDRIN MIGRAINE) 250-250-65 MG tablet Take 2 tablets by mouth every 6 (six) hours as needed for migraine.    [provider]  atorvastatin (LIPITOR) 40 MG tablet Take 40 mg by mouth at bedtime.    [provider]  cholecalciferol (VITAMIN D) 1000 units tablet Take 1,000 Units by mouth daily.    [provider]  citalopram (CELEXA) 40 MG tablet Take 40 mg by mouth daily.    [provider]  cyclobenzaprine (FLEXERIL) 10 MG tablet Take 10 mg by mouth 3 (three) times daily as needed for muscle spasms.    [provider]  diazepam  (VALIUM) 10 MG tablet Take 10 mg by mouth at bedtime as needed for anxiety.    [provider]  diclofenac sodium (VOLTAREN) 1 % GEL Apply 2 g topically 4 (four) times daily.    [provider]  DULoxetine (CYMBALTA) 30 MG capsule Take 30 mg by mouth daily.    [provider]  escitalopram (LEXAPRO) 10 MG tablet Take 10 mg by mouth daily.    [provider]  esomeprazole (NEXIUM) 40 MG capsule Take 40 mg by mouth 2 (two) times daily.    [provider]  Fluticasone-Salmeterol (ADVAIR) 250-50 MCG/DOSE AEPB Inhale 1 puff into the lungs 2 (two) times daily.    [provider]  glipiZIDE-metformin (METAGLIP) 2.5-500 MG tablet Take 1 tablet by mouth 2 (two) times daily before a meal.    [provider]  Ipratropium-Albuterol (COMBIVENT IN) Inhale 2 puffs into the lungs 4 (four) times daily.    [provider]  levothyroxine (SYNTHROID, LEVOTHROID) 125 MCG tablet Take 125 mcg by mouth daily before breakfast.    [provider]  losartan-hydrochlorothiazide (HYZAAR) 100-25 MG tablet Take 1 tablet by mouth daily.    [provider]  meclizine (ANTIVERT) 25 MG tablet Take 1 tablet (25 mg total) by mouth 3 (three) times daily as needed for dizziness. 03/24/16   Hinda Kehr, MD  meloxicam (MOBIC) 15 MG tablet Take 15 mg by mouth daily.    [provider]  metaxalone (SKELAXIN) 800 MG tablet Take 800 mg by mouth 3 (three) times daily.    [provider]  metoprolol succinate (TOPROL-XL) 25 MG 24 hr tablet Take 25 mg by mouth daily.    [provider]  montelukast (SINGULAIR) 10 MG tablet Take 10 mg by mouth at bedtime.    [provider]  nitroGLYCERIN (NITROSTAT) 0.4 MG SL tablet Place 0.4 mg under the tongue every 5 (five) minutes as needed for chest pain.    [provider]  ondansetron (ZOFRAN ODT) 4 MG disintegrating tablet Allow 1-2 tablets to dissolve in your mouth every  8 hours as needed for nausea/vomiting 03/24/16   Hinda Kehr, MD  oxyCODONE-acetaminophen (PERCOCET) 10-325 MG tablet Take 1 tablet by mouth every 4 (four) hours as needed for pain.    [provider]  potassium chloride (K-DUR,KLOR-CON) 10 MEQ tablet Take 10 mEq by mouth 2 (two) times daily.    [provider]  pregabalin (LYRICA) 75 MG capsule Take 75 mg by mouth 2 (two) times daily.    [provider]  traZODone (DESYREL) 100 MG tablet Take 200 mg by mouth at bedtime.    [provider]    Allergies Patient has no known allergies.  Family History  Problem Relation Age of Onset  . Breast cancer Maternal Aunt 60    Social History Social History  Tobacco Use  . Smoking status: Current Some Day Smoker    Packs/day: 0.25    Years: 15.00    Pack years: 3.75    Types: Cigarettes  . Smokeless tobacco: Never Used  Substance Use Topics  . Alcohol use: Yes    Comment: 2 beers a month  . Drug use: No    Review of Systems Constitutional: No fever/chills Eyes: No visual changes. ENT: No sore throat. No stiff neck no neck pain Cardiovascular: See HPI Respiratory: Denies shortness of breath. Gastrointestinal:   no vomiting.  No diarrhea.  No constipation. Genitourinary: Negative for dysuria. Musculoskeletal: Negative lower extremity swelling Skin: Negative for rash. Neurological: Negative for severe headaches, focal weakness or numbness.   ____________________________________________   PHYSICAL EXAM:  VITAL SIGNS: ED Triage Vitals  Enc Vitals Group     BP 01/25/18 1312 (!) 143/61     Pulse Rate 01/25/18 1312 84     Resp 01/25/18 1312 18     Temp 01/25/18 1312 98.3 F (36.8 C)     Temp Source 01/25/18 1312 Oral     SpO2 01/25/18 1312 96 %     Weight 01/25/18 1313 211 lb (95.7 kg)     Height 01/25/18 1313 5\' 5"  (1.651 m)     Head Circumference --      Peak Flow --      Pain Score 01/25/18 1313 7     Pain Loc --      Pain Edu? --       Excl. in [place]? --     Constitutional: Alert and oriented. Well appearing and in no acute distress.  Anxious but in no acute distress Eyes: Conjunctivae are normal Head: Atraumatic HEENT: No congestion/rhinnorhea. Mucous membranes are moist.  Oropharynx non-erythematous Neck:   Nontender with no meningismus, no masses, no stridor Cardiovascular: Normal rate, regular rhythm. Grossly normal heart sounds.  Good peripheral circulation. Respiratory: Normal respiratory effort.  No retractions. Lungs CTAB. Abdominal: Soft and palpation in the abdomen, in the epigastric region, which reproduces her discomfort, very mild discomfort noted.. No distention. No guarding no rebound Back:  There is no focal tenderness or step off.  there is no midline tenderness there are no lesions noted. there is no CVA tenderness Musculoskeletal: No lower extremity tenderness, no upper extremity tenderness. No joint effusions, no DVT signs strong distal pulses no edema Neurologic:  Normal speech and language. No gross focal neurologic deficits are appreciated.  Skin:  Skin is warm, dry and intact. No rash noted. Psychiatric: Mood and affect are normal. Speech and behavior are normal.  ____________________________________________   LABS (all labs ordered are listed, but only abnormal results are displayed)  Labs Reviewed  BASIC METABOLIC PANEL - Abnormal; Notable for the following components:      Result Value   Sodium 134 (*)    Chloride 95 (*)    Glucose, Bld 152 (*)    Calcium 11.2 (*)    All other components within normal limits  CBC - Abnormal; Notable for the following components:   WBC 14.0 (*)    All other components within normal limits  TROPONIN I  PROTIME-INR  APTT  HEPATIC FUNCTION PANEL  LIPASE, BLOOD    Pertinent labs  results that were available during my care of the patient were reviewed by me and considered in my medical decision making (see chart for  details). ____________________________________________  EKG  I personally interpreted any EKGs ordered by me or triage EKG shows  normal sinus rhythm no acute ST elevation or depression, no acute ischemic changes ____________________________________________  RADIOLOGY  Pertinent labs & imaging results that were available during my care of the patient were reviewed by me and considered in my medical decision making (see chart for details). If possible, patient and/or family made aware of any abnormal findings.  Dg Chest 2 View  Result Date: 01/25/2018 CLINICAL DATA:  Chest pain and shortness of breath. EXAM: CHEST - 2 VIEW COMPARISON:  Chest x-ray dated August 25, 2008. FINDINGS: The heart size and mediastinal contours are within normal limits. Normal pulmonary vascularity. Scarring/atelectasis in the lingula. No focal consolidation, pleural effusion, or pneumothorax. No acute osseous abnormality. IMPRESSION: No active cardiopulmonary disease. Electronically Signed   By: Titus Dubin M.D.   On: 01/25/2018 13:34   ____________________________________________    PROCEDURES  Procedure(s) performed: None  Procedures  Critical Care performed: None  ____________________________________________   INITIAL IMPRESSION / ASSESSMENT AND PLAN / ED COURSE  Pertinent labs & imaging results that were available during my care of the patient were reviewed by me and considered in my medical decision making (see chart for details).  Patient here with reproducible epigastric pain for many months, sent here by cardiology for further assessment.  Has been taking nitro with no relief.  Differential does include ACS but very low suspicion she has had pain since 3:00 this morning with a negative troponin and reassuring EKG, patient does have fibromyalgia, multiple different body aches and pains on a regular basis, as well as a history of unfortunately PEs.  Will obtain CT of the chest and pelvis because  most of this abdominal pain seems to be in the abdomen when I touch her but she states it goes up towards her chest.  If CT scan is negative it is my hope that we can get her safely home given chronicity of complaint and reassuring workup.  Patient very comfortable with this plan.  She does feel better after GI cocktail.  ----------------------------------------- 8:27 PM on 01/25/2018 ----------------------------------------- She is a cheeseburger at this time, abdomen benign, no ongoing pain in her chest or abdomen.  Discussed with Dr. call would he knows the patient well, he states that this pain is been an ongoing issue for years and they do not think she needs admission to the hospital for it, I do agree.  A second troponin is negative they do recommend discharge.  He was made aware of CT findings including CT findings of calcifications in the LAD etc.  He states that we will follow closely up as an outpatient.  There is no indication at this time the patient has acute coronary syndrome, dissection myocarditis endocarditis pericarditis PE pneumonia pneumothorax or any other acute intrathoracic pathology.  Nothing to suggest pericarditis.  We do notice, that the patient has what is possibly a retained gallbladder stone, however there is no evidence of liver function test elevation, inflammation, common bile duct involvement etc.  This is been going on again for months I suspect is actually the cause of her discomfort.  She has been made aware of this and will follow up with her GI doctor tomorrow.  In addition, there is some questionable lymphadenopathy, she is also been made aware of this and she will follow closely with her primary care doctor for it.  Patient is no acute distress, compression spelled given and understood.  There was one option saturation of 91% that was listed however there was poor waveform at that time  ____________________________________________   FINAL CLINICAL  IMPRESSION(S) / ED DIAGNOSES  Final diagnoses:  None      This chart was dictated using voice recognition software.  Despite best efforts to proofread,  errors can occur which can change meaning.      Schuyler Amor, MD 01/25/18 1759    Schuyler Amor, MD 01/25/18 2029

## 2018-01-28 ENCOUNTER — Telehealth: Payer: Self-pay

## 2018-01-28 NOTE — Telephone Encounter (Signed)
Pt called triage line stating she has a UTI. She is aware [facility] is out of the office today but thinks someone else can call in a Rx. [facility] patient protocol is she be seen. D/T the office having training this afternoon, there are no openings. Advised otc Azo, she states she has that at home and will try it. Also, advised going to urgent care. She states she has an appointment today with Dr.Hedrick and will mention it to him. Pt was thankful for the call and advise.  KJ CMA (AAMA)

## 2018-02-01 ENCOUNTER — Other Ambulatory Visit: Payer: Self-pay | Admitting: Nurse Practitioner

## 2018-02-01 DIAGNOSIS — R933 Abnormal findings on diagnostic imaging of other parts of digestive tract: Secondary | ICD-10-CM

## 2018-02-04 ENCOUNTER — Ambulatory Visit
Admission: RE | Admit: 2018-02-04 | Discharge: 2018-02-04 | Disposition: A | Discharge status: Home / Self Care | Payer: Medicare Other | Source: Ambulatory Visit | Attending: Nurse Practitioner | Admitting: Nurse Practitioner

## 2018-02-04 ENCOUNTER — Telehealth: Payer: Self-pay | Admitting: Surgery

## 2018-02-04 ENCOUNTER — Other Ambulatory Visit: Payer: Self-pay | Admitting: Nurse Practitioner

## 2018-02-04 DIAGNOSIS — K769 Liver disease, unspecified: Secondary | ICD-10-CM | POA: Diagnosis not present

## 2018-02-04 DIAGNOSIS — R933 Abnormal findings on diagnostic imaging of other parts of digestive tract: Secondary | ICD-10-CM | POA: Insufficient documentation

## 2018-02-04 DIAGNOSIS — K760 Fatty (change of) liver, not elsewhere classified: Secondary | ICD-10-CM | POA: Insufficient documentation

## 2018-02-04 DIAGNOSIS — Z9081 Acquired absence of spleen: Secondary | ICD-10-CM | POA: Insufficient documentation

## 2018-02-04 DIAGNOSIS — Z9049 Acquired absence of other specified parts of digestive tract: Secondary | ICD-10-CM | POA: Insufficient documentation

## 2018-02-04 MED ORDER — GADOBENATE DIMEGLUMINE 529 MG/ML IV SOLN
20.0000 mL | Freq: Once | INTRAVENOUS | Status: AC | PRN
  Administered 2018-02-04: 20 mL via INTRAVENOUS

## 2018-02-04 NOTE — Telephone Encounter (Signed)
-----   Message from Mickie Kay sent at 02/03/2018  5:29 PM EDT ----- Regarding: ED referral Per Dr Corlis Leak note in referral--Please have pt see me next available when in clinic.

## 2018-02-04 NOTE — Telephone Encounter (Signed)
Left a message for the patient to call the office. °

## 2018-02-09 NOTE — Telephone Encounter (Signed)
Pt is coming in on 02/25/18 to see Dr. Dahlia Byes.

## 2018-02-25 ENCOUNTER — Ambulatory Visit: Payer: Self-pay | Admitting: Surgery

## 2018-06-10 ENCOUNTER — Encounter: Payer: Self-pay | Admitting: *Deleted

## 2018-06-14 ENCOUNTER — Other Ambulatory Visit: Payer: Self-pay | Admitting: Obstetrics & Gynecology

## 2018-06-14 ENCOUNTER — Telehealth: Payer: Self-pay

## 2018-06-14 DIAGNOSIS — Z1231 Encounter for screening mammogram for malignant neoplasm of breast: Secondary | ICD-10-CM

## 2018-06-14 NOTE — Telephone Encounter (Signed)
Pt called today wondering if she needed an order or to come be seen before her mammo in October. Tried to call pt, she needs an appt since she has not been seen in the last 12 months, [place] will not schedule. Please let her know when she calls back and schedule an appt for her so she can get her mammo.

## 2018-06-15 ENCOUNTER — Ambulatory Visit (INDEPENDENT_AMBULATORY_CARE_PROVIDER_SITE_OTHER): Payer: Medicare Other | Admitting: General Surgery

## 2018-06-15 ENCOUNTER — Encounter: Payer: Self-pay | Admitting: General Surgery

## 2018-06-15 VITALS — BP 132/74 | HR 88 | Resp 18 | Ht 65.0 in | Wt 200.0 lb

## 2018-06-15 DIAGNOSIS — R1010 Upper abdominal pain, unspecified: Secondary | ICD-10-CM

## 2018-06-15 DIAGNOSIS — R110 Nausea: Secondary | ICD-10-CM | POA: Diagnosis not present
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
## 2018-06-15 NOTE — Patient Instructions (Signed)
The patient is aware to call back for any questions or new concerns.  

## 2018-06-16 ENCOUNTER — Telehealth: Payer: Self-pay | Admitting: General Surgery

## 2018-06-16 ENCOUNTER — Other Ambulatory Visit: Payer: Self-pay | Admitting: Nurse Practitioner

## 2018-06-16 DIAGNOSIS — D739 Disease of spleen, unspecified: Principal | ICD-10-CM

## 2018-06-16 DIAGNOSIS — D7389 Other diseases of spleen: Secondary | ICD-10-CM

## 2018-06-16 NOTE — Telephone Encounter (Signed)
Jody in Nuclear Medicine @ [facility] called to state that this patient has an appointment on 06-23-18 @ 1:00pm to have a liver scan. This was the patient you and Dr Vira Agar discussed.

## 2018-06-17 DIAGNOSIS — R1010 Upper abdominal pain, unspecified: Secondary | ICD-10-CM | POA: Insufficient documentation

## 2018-06-17 DIAGNOSIS — R110 Nausea: Secondary | ICD-10-CM | POA: Insufficient documentation

## 2018-06-23 ENCOUNTER — Ambulatory Visit
Admission: RE | Admit: 2018-06-23 | Discharge: 2018-06-23 | Disposition: A | Discharge status: Home / Self Care | Payer: Medicare Other | Source: Ambulatory Visit | Attending: Nurse Practitioner | Admitting: Nurse Practitioner

## 2018-06-23 DIAGNOSIS — D739 Disease of spleen, unspecified: Secondary | ICD-10-CM | POA: Diagnosis present

## 2018-06-23 DIAGNOSIS — D7389 Other diseases of spleen: Secondary | ICD-10-CM | POA: Insufficient documentation

## 2018-06-23 MED ORDER — TECHNETIUM TC 99M SULFUR COLLOID
3.9700 | Freq: Once | INTRAVENOUS | Status: AC | PRN
  Administered 2018-06-23: 3.97 via INTRAVENOUS

## 2018-06-25 ENCOUNTER — Other Ambulatory Visit: Payer: Self-pay

## 2018-07-08 ENCOUNTER — Telehealth: Payer: Self-pay | Admitting: General Surgery

## 2018-07-08 NOTE — Telephone Encounter (Signed)
Patient is calling asking if she is going to have to have surgery, said she spoke with Dr. Vira Agar and they were going to get together and let her know if she needed surgery. Patient can be reached at [phone]. Please call patient and advise.

## 2018-07-09 NOTE — Telephone Encounter (Signed)
SPECT scan of June 24, 2018 reviewed.  Multiple nodules with uptake consistent with splenule status post splenic rupture.  No indication for surgical intervention at this time.

## 2018-07-09 NOTE — Telephone Encounter (Signed)
Notified patient as instructed, patient pleased. Will follow up with Dr Elliott's office.

## 2018-07-26 ENCOUNTER — Ambulatory Visit
Admission: RE | Admit: 2018-07-26 | Discharge: 2018-07-26 | Disposition: A | Discharge status: Home / Self Care | Payer: Medicare Other | Source: Ambulatory Visit | Attending: Obstetrics & Gynecology | Admitting: Obstetrics & Gynecology

## 2018-07-26 DIAGNOSIS — Z1231 Encounter for screening mammogram for malignant neoplasm of breast: Secondary | ICD-10-CM | POA: Insufficient documentation

## 2018-07-27 ENCOUNTER — Encounter: Payer: Self-pay | Admitting: Obstetrics & Gynecology

## 2018-08-19 DIAGNOSIS — K760 Fatty (change of) liver, not elsewhere classified: Secondary | ICD-10-CM | POA: Diagnosis present

## 2019-03-10 ENCOUNTER — Other Ambulatory Visit: Payer: Self-pay | Admitting: Unknown Physician Specialty

## 2019-03-10 DIAGNOSIS — M1731 Unilateral post-traumatic osteoarthritis, right knee: Secondary | ICD-10-CM

## 2019-05-31 ENCOUNTER — Telehealth: Payer: Self-pay | Admitting: Obstetrics & Gynecology

## 2019-05-31 NOTE — Telephone Encounter (Signed)
Called and left voice mail for patient to call back to be schedule °

## 2019-05-31 NOTE — Telephone Encounter (Signed)
Patient is calling needing a order for her to schedule her mammogram in [place]. Patient reports she has no history of Breast problems and is not sure why she needs an order. Please advise

## 2019-06-06 ENCOUNTER — Other Ambulatory Visit: Payer: Self-pay | Admitting: Obstetrics & Gynecology

## 2019-06-06 DIAGNOSIS — Z1231 Encounter for screening mammogram for malignant neoplasm of breast: Secondary | ICD-10-CM

## 2019-06-23 ENCOUNTER — Encounter: Payer: Self-pay | Admitting: Obstetrics & Gynecology

## 2019-06-23 ENCOUNTER — Ambulatory Visit (INDEPENDENT_AMBULATORY_CARE_PROVIDER_SITE_OTHER): Payer: Medicare Other | Admitting: Obstetrics & Gynecology

## 2019-06-23 ENCOUNTER — Other Ambulatory Visit (HOSPITAL_COMMUNITY)
Admission: RE | Admit: 2019-06-23 | Discharge: 2019-06-23 | Disposition: A | Discharge status: Home / Self Care | Payer: Medicare Other | Source: Ambulatory Visit | Attending: Obstetrics & Gynecology | Admitting: Obstetrics & Gynecology

## 2019-06-23 ENCOUNTER — Other Ambulatory Visit: Payer: Self-pay

## 2019-06-23 VITALS — BP 150/80 | Ht 65.0 in | Wt 203.0 lb

## 2019-06-23 DIAGNOSIS — Z1272 Encounter for screening for malignant neoplasm of vagina: Secondary | ICD-10-CM

## 2019-06-23 DIAGNOSIS — Z01419 Encounter for gynecological examination (general) (routine) without abnormal findings: Secondary | ICD-10-CM

## 2019-06-23 DIAGNOSIS — Z1239 Encounter for other screening for malignant neoplasm of breast: Secondary | ICD-10-CM

## 2019-06-23 DIAGNOSIS — R42 Dizziness and giddiness: Secondary | ICD-10-CM

## 2019-06-23 DIAGNOSIS — F419 Anxiety disorder, unspecified: Secondary | ICD-10-CM

## 2019-06-23 DIAGNOSIS — N390 Urinary tract infection, site not specified: Secondary | ICD-10-CM

## 2019-06-23 MED ORDER — CIPROFLOXACIN HCL 500 MG PO TABS: 500.0000 mg | ORAL_TABLET | Freq: Two times a day (BID) | ORAL | 5 refills | Status: DC

## 2019-06-23 MED ORDER — MECLIZINE HCL 25 MG PO TABS: 25.0000 mg | ORAL_TABLET | Freq: Three times a day (TID) | ORAL | 5 refills | Status: DC | PRN

## 2019-06-23 MED ORDER — ALPRAZOLAM 0.5 MG PO TABS: 0.5000 mg | ORAL_TABLET | Freq: Every evening | ORAL | 5 refills | Status: DC | PRN
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
## 2019-06-23 NOTE — Patient Instructions (Signed)
PAP every 5 years Mammogram every year    Call [phone] to schedule at [place] Colonoscopy every 10 years Labs yearly (with PCP) 

## 2019-06-25 LAB — URINE CULTURE

## 2019-06-28 LAB — CYTOLOGY - PAP: Diagnosis: NEGATIVE

## 2019-07-13 ENCOUNTER — Telehealth: Payer: Self-pay

## 2019-07-13 NOTE — Telephone Encounter (Signed)
Pt calling for results of pap and urine, pt aware of results

## 2019-07-28 ENCOUNTER — Ambulatory Visit
Admission: RE | Admit: 2019-07-28 | Discharge: 2019-07-28 | Disposition: A | Discharge status: Home / Self Care | Payer: Medicare Other | Source: Ambulatory Visit | Attending: Obstetrics & Gynecology | Admitting: Obstetrics & Gynecology

## 2019-07-28 DIAGNOSIS — Z1231 Encounter for screening mammogram for malignant neoplasm of breast: Secondary | ICD-10-CM

## 2019-10-11 ENCOUNTER — Other Ambulatory Visit: Payer: Self-pay | Admitting: Obstetrics & Gynecology

## 2019-11-17 ENCOUNTER — Other Ambulatory Visit: Payer: Self-pay | Admitting: Family Medicine

## 2019-11-18 ENCOUNTER — Other Ambulatory Visit: Payer: Self-pay | Admitting: Family Medicine

## 2019-11-18 DIAGNOSIS — R109 Unspecified abdominal pain: Secondary | ICD-10-CM

## 2020-01-12 ENCOUNTER — Other Ambulatory Visit: Payer: Self-pay | Admitting: Surgery

## 2020-01-12 DIAGNOSIS — M1731 Unilateral post-traumatic osteoarthritis, right knee: Secondary | ICD-10-CM

## 2020-01-12 DIAGNOSIS — M2391 Unspecified internal derangement of right knee: Secondary | ICD-10-CM

## 2020-01-25 ENCOUNTER — Ambulatory Visit: Payer: Medicare Other

## 2020-04-04 ENCOUNTER — Other Ambulatory Visit: Payer: Self-pay | Admitting: Obstetrics & Gynecology

## 2020-04-04 DIAGNOSIS — F419 Anxiety disorder, unspecified: Secondary | ICD-10-CM

## 2020-04-25 ENCOUNTER — Telehealth: Payer: Self-pay | Admitting: *Deleted

## 2020-04-25 NOTE — Telephone Encounter (Signed)
Received referral for low dose lung cancer screening CT scan. Message left at phone number listed in EMR for patient to call me back to facilitate scheduling scan.  

## 2020-05-15 ENCOUNTER — Other Ambulatory Visit: Payer: Self-pay | Admitting: Obstetrics & Gynecology

## 2020-05-15 DIAGNOSIS — F419 Anxiety disorder, unspecified: Secondary | ICD-10-CM

## 2020-06-13 ENCOUNTER — Telehealth: Payer: Self-pay

## 2020-06-13 DIAGNOSIS — Z122 Encounter for screening for malignant neoplasm of respiratory organs: Secondary | ICD-10-CM

## 2020-06-13 DIAGNOSIS — Z87891 Personal history of nicotine dependence: Secondary | ICD-10-CM

## 2020-06-13 NOTE — Telephone Encounter (Signed)
Contacted patient for lung screening clinic after receiving referral from Dr. Kary Kos.  Message left with patient to call Burgess Estelle, lung navigator to set up appointment for CT scan.

## 2020-06-15 ENCOUNTER — Other Ambulatory Visit: Payer: Self-pay | Admitting: Obstetrics & Gynecology

## 2020-06-15 DIAGNOSIS — Z1231 Encounter for screening mammogram for malignant neoplasm of breast: Secondary | ICD-10-CM

## 2020-06-18 ENCOUNTER — Encounter: Payer: Self-pay | Admitting: *Deleted

## 2020-06-18 NOTE — Telephone Encounter (Signed)
Received referral for initial lung cancer screening scan. Contacted patient and obtained smoking history,(current, 35.25 pack year) as well as answering questions related to screening process. Patient denies signs of lung cancer such as weight loss or hemoptysis. Patient denies comorbidity that would prevent curative treatment if lung cancer were found. Patient is scheduled for shared decision making visit and CT scan on 06/22/20.

## 2020-06-18 NOTE — Addendum Note (Signed)
Addended by: Lieutenant Diego on: 06/18/2020 09:25 AM   Modules accepted: Orders

## 2020-06-22 ENCOUNTER — Inpatient Hospital Stay: Payer: Medicare PPO | Attending: Oncology | Admitting: Nurse Practitioner

## 2020-06-22 ENCOUNTER — Other Ambulatory Visit: Payer: Self-pay

## 2020-06-22 ENCOUNTER — Ambulatory Visit
Admission: RE | Admit: 2020-06-22 | Discharge: 2020-06-22 | Disposition: A | Discharge status: Home / Self Care | Payer: Medicare PPO | Source: Ambulatory Visit | Attending: Oncology | Admitting: Oncology

## 2020-06-22 DIAGNOSIS — F17200 Nicotine dependence, unspecified, uncomplicated: Secondary | ICD-10-CM | POA: Diagnosis present

## 2020-06-22 DIAGNOSIS — Z122 Encounter for screening for malignant neoplasm of respiratory organs: Secondary | ICD-10-CM

## 2020-06-22 DIAGNOSIS — Z87891 Personal history of nicotine dependence: Secondary | ICD-10-CM | POA: Diagnosis not present

## 2020-06-22 DIAGNOSIS — F17210 Nicotine dependence, cigarettes, uncomplicated: Secondary | ICD-10-CM | POA: Diagnosis not present

## 2020-06-22 DIAGNOSIS — U071 COVID-19: Secondary | ICD-10-CM

## 2020-06-22 NOTE — Progress Notes (Signed)
Virtual Visit via Video Enabled Telemedicine Note   I connected with Erma Heritage on 06/22/20 at 9:00 am EST by video enabled telemedicine visit and verified that I am speaking with the correct person using two identifiers.   I discussed the limitations, risks, security and privacy concerns of performing an evaluation and management service by telemedicine and the availability of in-person appointments. I also discussed with the patient that there may be a patient responsible charge related to this service. The patient expressed understanding and agreed to proceed.   Other persons participating in the visit and their role in the encounter: Burgess Estelle, RN- checking in patient & navigation  Patient's location: home  Provider's location: Clinic  Chief Complaint: Low Dose CT Screening  Patient agreed to evaluation by telemedicine to discuss shared decision making for consideration of low dose CT lung cancer screening.    In accordance with CMS guidelines, patient has met eligibility criteria including age, absence of signs or symptoms of lung cancer.  Social History   Tobacco Use  . Smoking status: Current Some Day Smoker    Packs/day: 0.75    Years: 47.00    Pack years: 35.25    Types: Cigarettes  . Smokeless tobacco: Never Used  Substance Use Topics  . Alcohol use: Yes    Comment: 2 beers a month     A shared decision-making session was conducted prior to the performance of CT scan. This includes one or more decision aids, includes benefits and harms of screening, follow-up diagnostic testing, over-diagnosis, false positive rate, and total radiation exposure.   Counseling on the importance of adherence to annual lung cancer LDCT screening, impact of co-morbidities, and ability or willingness to undergo diagnosis and treatment is imperative for compliance of the program.   Counseling on the importance of continued smoking cessation for former smokers; the importance of smoking  cessation for current smokers, and information about tobacco cessation interventions have been given to patient including [place] and [employer] [place] programs.   Written order for lung cancer screening with LDCT has been given to the patient and any and all questions have been answered to the best of my abilities.    Yearly follow up will be coordinated by Burgess Estelle, Thoracic Navigator.  I discussed the assessment and treatment plan with the patient. The patient was provided an opportunity to ask questions and all were answered. The patient agreed with the plan and demonstrated an understanding of the instructions.   The patient was advised to call back or seek an in-person evaluation if the symptoms worsen or if the condition fails to improve as anticipated.   I provided 15 minutes of face-to-face video visit time during this encounter, and > 50% was spent counseling as documented under my assessment & plan.   Beckey Rutter, DNP, AGNP-C [place] [facility] [phone] (clinic)  Patient asks that we call her son, Glyn Ade, on his cell phone with results as she will be out of town. She also requests a hard copy to be mailed to her.

## 2020-06-25 ENCOUNTER — Telehealth: Payer: Self-pay | Admitting: *Deleted

## 2020-06-25 NOTE — Telephone Encounter (Signed)
Notified patient of LDCT lung cancer screening program results with recommendation for 12 month follow up imaging. Also notified of incidental findings noted below and is encouraged to discuss further with PCP who will receive a copy of this note and/or the CT report. Patient verbalizes understanding.   IMPRESSION: 1. Lung-RADS 1, negative. Continue annual screening with low-dose chest CT without contrast in 12 months. 2. Coronary artery calcifications. 3. 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  Aortic Atherosclerosis (ICD10-I70.0) and Emphysema (ICD10-J43.9).

## 2020-07-30 ENCOUNTER — Other Ambulatory Visit: Payer: Self-pay | Admitting: Obstetrics & Gynecology

## 2020-07-30 DIAGNOSIS — N390 Urinary tract infection, site not specified: Secondary | ICD-10-CM

## 2020-07-30 DIAGNOSIS — R42 Dizziness and giddiness: Secondary | ICD-10-CM

## 2020-07-30 DIAGNOSIS — F419 Anxiety disorder, unspecified: Secondary | ICD-10-CM

## 2020-08-01 ENCOUNTER — Ambulatory Visit
Admission: RE | Admit: 2020-08-01 | Discharge: 2020-08-01 | Disposition: A | Discharge status: Home / Self Care | Payer: Medicare PPO | Source: Ambulatory Visit | Attending: Obstetrics & Gynecology | Admitting: Obstetrics & Gynecology

## 2020-08-01 ENCOUNTER — Other Ambulatory Visit: Payer: Self-pay

## 2020-08-01 DIAGNOSIS — Z1231 Encounter for screening mammogram for malignant neoplasm of breast: Secondary | ICD-10-CM

## 2021-02-07 ENCOUNTER — Observation Stay
Admission: EM | Admit: 2021-02-07 | Discharge: 2021-02-14 | Disposition: A | Discharge status: Home / Self Care | Payer: No Typology Code available for payment source | Attending: Family Medicine | Admitting: Family Medicine

## 2021-02-07 ENCOUNTER — Emergency Department: Payer: No Typology Code available for payment source

## 2021-02-07 ENCOUNTER — Other Ambulatory Visit: Payer: Self-pay

## 2021-02-07 DIAGNOSIS — Z79899 Other long term (current) drug therapy: Secondary | ICD-10-CM | POA: Insufficient documentation

## 2021-02-07 DIAGNOSIS — Z7982 Long term (current) use of aspirin: Secondary | ICD-10-CM | POA: Insufficient documentation

## 2021-02-07 DIAGNOSIS — W010XXA Fall on same level from slipping, tripping and stumbling without subsequent striking against object, initial encounter: Secondary | ICD-10-CM | POA: Diagnosis not present

## 2021-02-07 DIAGNOSIS — S0990XA Unspecified injury of head, initial encounter: Secondary | ICD-10-CM

## 2021-02-07 DIAGNOSIS — S2239XA Fracture of one rib, unspecified side, initial encounter for closed fracture: Secondary | ICD-10-CM | POA: Diagnosis present

## 2021-02-07 DIAGNOSIS — R52 Pain, unspecified: Secondary | ICD-10-CM

## 2021-02-07 DIAGNOSIS — J449 Chronic obstructive pulmonary disease, unspecified: Secondary | ICD-10-CM | POA: Insufficient documentation

## 2021-02-07 DIAGNOSIS — Z20822 Contact with and (suspected) exposure to covid-19: Secondary | ICD-10-CM | POA: Insufficient documentation

## 2021-02-07 DIAGNOSIS — J45909 Unspecified asthma, uncomplicated: Secondary | ICD-10-CM | POA: Diagnosis not present

## 2021-02-07 DIAGNOSIS — S270XXA Traumatic pneumothorax, initial encounter: Secondary | ICD-10-CM | POA: Diagnosis not present

## 2021-02-07 DIAGNOSIS — S065X9A Traumatic subdural hemorrhage with loss of consciousness of unspecified duration, initial encounter: Secondary | ICD-10-CM

## 2021-02-07 DIAGNOSIS — J939 Pneumothorax, unspecified: Secondary | ICD-10-CM

## 2021-02-07 DIAGNOSIS — S065X0A Traumatic subdural hemorrhage without loss of consciousness, initial encounter: Principal | ICD-10-CM | POA: Insufficient documentation

## 2021-02-07 DIAGNOSIS — S2231XA Fracture of one rib, right side, initial encounter for closed fracture: Secondary | ICD-10-CM | POA: Diagnosis not present

## 2021-02-07 DIAGNOSIS — S065XAA Traumatic subdural hemorrhage with loss of consciousness status unknown, initial encounter: Secondary | ICD-10-CM

## 2021-02-07 DIAGNOSIS — S0101XA Laceration without foreign body of scalp, initial encounter: Secondary | ICD-10-CM | POA: Insufficient documentation

## 2021-02-07 DIAGNOSIS — I10 Essential (primary) hypertension: Secondary | ICD-10-CM | POA: Diagnosis not present

## 2021-02-07 DIAGNOSIS — E119 Type 2 diabetes mellitus without complications: Secondary | ICD-10-CM | POA: Insufficient documentation

## 2021-02-07 DIAGNOSIS — F17210 Nicotine dependence, cigarettes, uncomplicated: Secondary | ICD-10-CM | POA: Insufficient documentation

## 2021-02-07 DIAGNOSIS — S2232XA Fracture of one rib, left side, initial encounter for closed fracture: Secondary | ICD-10-CM

## 2021-02-07 LAB — COMPREHENSIVE METABOLIC PANEL
ALT: 23 U/L (ref 0–44)
AST: 23 U/L (ref 15–41)
Albumin: 3.9 g/dL (ref 3.5–5.0)
Alkaline Phosphatase: 63 U/L (ref 38–126)
Anion gap: 9 (ref 5–15)
BUN: 11 mg/dL (ref 8–23)
CO2: 27 mmol/L (ref 22–32)
Calcium: 9 mg/dL (ref 8.9–10.3)
Chloride: 93 mmol/L — ABNORMAL LOW (ref 98–111)
Creatinine, Ser: 0.6 mg/dL (ref 0.44–1.00)
GFR, Estimated: >60 mL/min (ref >60)
Glucose, Bld: 112 mg/dL — ABNORMAL HIGH (ref 70–99)
Potassium: 3.3 mmol/L — ABNORMAL LOW (ref 3.5–5.1)
Sodium: 129 mmol/L — ABNORMAL LOW (ref 135–145)
Total Bilirubin: 0.9 mg/dL (ref 0.3–1.2)
Total Protein: 6.7 g/dL (ref 6.5–8.1)

## 2021-02-07 LAB — CBC
HCT: 38.9 % (ref 36.0–46.0)
Hemoglobin: 13.3 g/dL (ref 12.0–15.0)
MCH: 33.2 pg (ref 26.0–34.0)
MCHC: 34.2 g/dL (ref 30.0–36.0)
MCV: 97 fL (ref 80.0–100.0)
Platelets: 317 10*3/uL (ref 150–400)
RBC: 4.01 MIL/uL (ref 3.87–5.11)
RDW: 12.7 % (ref 11.5–15.5)
WBC: 16.9 10*3/uL — ABNORMAL HIGH (ref 4.0–10.5)
nRBC: 0 % (ref 0.0–0.2)

## 2021-02-07 LAB — RESP PANEL BY RT-PCR (FLU A&B, COVID) ARPGX2
Influenza A by PCR: NEGATIVE
Influenza B by PCR: NEGATIVE
SARS Coronavirus 2 by RT PCR: NEGATIVE

## 2021-02-07 LAB — PROTIME-INR
INR: 1 (ref 0.8–1.2)
Prothrombin Time: 12.6 seconds (ref 11.4–15.2)

## 2021-02-07 MED ORDER — ONDANSETRON HCL 4 MG/2ML IJ SOLN
4.0000 mg | Freq: Once | INTRAMUSCULAR | Status: AC
  Administered 2021-02-07: 4 mg via INTRAVENOUS
  Filled 2021-02-07: qty 2

## 2021-02-07 MED ORDER — LIDOCAINE-EPINEPHRINE-TETRACAINE (LET) TOPICAL GEL
3.0000 mL | Freq: Once | TOPICAL | Status: AC
  Administered 2021-02-07: 3 mL via TOPICAL
  Filled 2021-02-07: qty 3

## 2021-02-07 MED ORDER — MORPHINE SULFATE (PF) 4 MG/ML IV SOLN
4.0000 mg | Freq: Once | INTRAVENOUS | Status: AC
Start: 2021-02-07 — End: 2021-02-07
  Administered 2021-02-07: 4 mg via INTRAVENOUS
  Filled 2021-02-07: qty 1

## 2021-02-07 MED ORDER — MORPHINE SULFATE (PF) 4 MG/ML IV SOLN
4.0000 mg | Freq: Once | INTRAVENOUS | Status: AC
  Administered 2021-02-07: 4 mg via INTRAVENOUS
  Filled 2021-02-07: qty 1

## 2021-02-07 MED ORDER — OXYCODONE-ACETAMINOPHEN 5-325 MG PO TABS
1.0000 | ORAL_TABLET | Freq: Once | ORAL | Status: AC
  Administered 2021-02-08: 1 via ORAL
  Filled 2021-02-07: qty 1

## 2021-02-07 MED ORDER — LEVETIRACETAM 500 MG PO TABS: 500.0000 mg | ORAL_TABLET | Freq: Two times a day (BID) | ORAL | 0 refills | Status: DC

## 2021-02-07 MED ORDER — LEVETIRACETAM 500 MG PO TABS
500.0000 mg | ORAL_TABLET | Freq: Once | ORAL | Status: AC
  Administered 2021-02-07: 500 mg via ORAL
  Filled 2021-02-07: qty 1
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
## 2021-02-07 NOTE — ED Notes (Signed)
Pt to CT

## 2021-02-07 NOTE — ED Triage Notes (Signed)
Pt fell and hit head on commode around 1630 today. Pt with 1 inch by 1/2 inch laceration to right side of head. Bleeding controlled and pressure dressing applied. Pt states she tripped and fell, also hit right side and per pt right ribs hurt. Pt states she cannot take deep breath without pain on right side. Pt in NAD at this time.

## 2021-02-07 NOTE — ED Notes (Signed)
Patient transported to CT 

## 2021-02-07 NOTE — ED Notes (Signed)
This RN to bedside to assist pt to bathroom. Pt ambulatory with assistance to bedside toilet and back to stretcher. Pt placed on purewick once back in room. Pt noted to have bruise on R hip. Paduchowski MD made aware of bruising.  Pt states to this RN she would like oxygen placed back on. SpO2 96% on RA. Pt states she feels like she can breathe better with oxygen so 2L [place] placed back on pt. Pt states she uses 2L [place] at home as needed

## 2021-02-07 NOTE — ED Notes (Signed)
Pt SpO2 dropped to 85% while sleeping, pt reports she does have sleep apnea and uses CPAP at home. Pt placed on 2L [place] while sleeping

## 2021-02-07 NOTE — Discharge Instructions (Signed)
Please follow-up with your doctor in 1 week for staple removal.  Please call the number provided for neurosurgery to arrange a follow-up appointment.  Please take your Keppra as prescribed for the next 7 days.  Return to the emergency department for any worsening headache, any weakness or numbness of any arm or leg confusion or difficulty speaking or thinking.

## 2021-02-07 NOTE — ED Notes (Addendum)
Pt brought back to ED06 from CT. Pt reports around 1600 today she had a fall. States she passed out earlier for "just a minute" after her fall. Endorses headache and R side pain. Pt states she lost her balance. She reports she has been falling a lot recently d/t her "knee and back lock up". She states this was her worst fall. Small R scalp lac, bleeding controlled. Pt's CT shows SDH, neuro intact. +10/10 headache +mild nausea -blood thinners  Pt placed in hospital gown, connected to monitor. Son at bedside. AAO VSS

## 2021-02-08 ENCOUNTER — Emergency Department: Payer: No Typology Code available for payment source

## 2021-02-08 ENCOUNTER — Inpatient Hospital Stay: Payer: No Typology Code available for payment source

## 2021-02-08 ENCOUNTER — Other Ambulatory Visit: Payer: Self-pay

## 2021-02-08 DIAGNOSIS — S2232XA Fracture of one rib, left side, initial encounter for closed fracture: Secondary | ICD-10-CM

## 2021-02-08 DIAGNOSIS — E785 Hyperlipidemia, unspecified: Secondary | ICD-10-CM

## 2021-02-08 DIAGNOSIS — S065X9A Traumatic subdural hemorrhage with loss of consciousness of unspecified duration, initial encounter: Secondary | ICD-10-CM

## 2021-02-08 DIAGNOSIS — J939 Pneumothorax, unspecified: Secondary | ICD-10-CM | POA: Diagnosis not present

## 2021-02-08 DIAGNOSIS — S2239XA Fracture of one rib, unspecified side, initial encounter for closed fracture: Secondary | ICD-10-CM | POA: Diagnosis present

## 2021-02-08 DIAGNOSIS — W19XXXA Unspecified fall, initial encounter: Secondary | ICD-10-CM

## 2021-02-08 DIAGNOSIS — S065XAA Traumatic subdural hemorrhage with loss of consciousness status unknown, initial encounter: Secondary | ICD-10-CM | POA: Diagnosis present

## 2021-02-08 DIAGNOSIS — S2231XA Fracture of one rib, right side, initial encounter for closed fracture: Secondary | ICD-10-CM

## 2021-02-08 LAB — BASIC METABOLIC PANEL
Anion gap: 9 (ref 5–15)
BUN: 11 mg/dL (ref 8–23)
CO2: 29 mmol/L (ref 22–32)
Calcium: 8.8 mg/dL — ABNORMAL LOW (ref 8.9–10.3)
Chloride: 96 mmol/L — ABNORMAL LOW (ref 98–111)
Creatinine, Ser: 0.65 mg/dL (ref 0.44–1.00)
GFR, Estimated: >60 mL/min (ref >60)
Glucose, Bld: 130 mg/dL — ABNORMAL HIGH (ref 70–99)
Potassium: 4.2 mmol/L (ref 3.5–5.1)
Sodium: 134 mmol/L — ABNORMAL LOW (ref 135–145)

## 2021-02-08 LAB — CBC
HCT: 39.8 % (ref 36.0–46.0)
Hemoglobin: 13.5 g/dL (ref 12.0–15.0)
MCH: 33.7 pg (ref 26.0–34.0)
MCHC: 33.9 g/dL (ref 30.0–36.0)
MCV: 99.3 fL (ref 80.0–100.0)
Platelets: 310 10*3/uL (ref 150–400)
RBC: 4.01 MIL/uL (ref 3.87–5.11)
RDW: 12.9 % (ref 11.5–15.5)
WBC: 10.9 10*3/uL — ABNORMAL HIGH (ref 4.0–10.5)
nRBC: 0 % (ref 0.0–0.2)

## 2021-02-08 LAB — GLUCOSE, CAPILLARY
Glucose-Capillary: 147 mg/dL — ABNORMAL HIGH (ref 70–99)
Glucose-Capillary: 158 mg/dL — ABNORMAL HIGH (ref 70–99)

## 2021-02-08 LAB — CBG MONITORING, ED: Glucose-Capillary: 132 mg/dL — ABNORMAL HIGH (ref 70–99)

## 2021-02-08 LAB — ETHANOL: Alcohol, Ethyl (B): <10 mg/dL (ref <10)

## 2021-02-08 LAB — HEMOGLOBIN A1C
Hgb A1c MFr Bld: 6.8 % — ABNORMAL HIGH (ref 4.8–5.6)
Mean Plasma Glucose: 148.46 mg/dL

## 2021-02-08 LAB — HIV ANTIBODY (ROUTINE TESTING W REFLEX): HIV Screen 4th Generation wRfx: NONREACTIVE

## 2021-02-08 MED ORDER — ALUM & MAG HYDROXIDE-SIMETH 200-200-20 MG/5ML PO SUSP: 30.0000 mL | ORAL | Status: DC | PRN

## 2021-02-08 MED ORDER — BUPROPION HCL ER (SR) 150 MG PO TB12
150.0000 mg | ORAL_TABLET | Freq: Every morning | ORAL | Status: DC
  Administered 2021-02-08 – 2021-02-14 (×7): 150 mg via ORAL
  Filled 2021-02-08 (×7): qty 1

## 2021-02-08 MED ORDER — NITROGLYCERIN 0.4 MG SL SUBL: 0.4000 mg | SUBLINGUAL_TABLET | SUBLINGUAL | Status: DC | PRN

## 2021-02-08 MED ORDER — ENOXAPARIN SODIUM 60 MG/0.6ML IJ SOSY: 0.5000 mg/kg | PREFILLED_SYRINGE | INTRAMUSCULAR | Status: DC

## 2021-02-08 MED ORDER — ESCITALOPRAM OXALATE 10 MG PO TABS: 10.0000 mg | ORAL_TABLET | Freq: Every day | ORAL | Status: DC

## 2021-02-08 MED ORDER — LOSARTAN POTASSIUM-HCTZ 100-25 MG PO TABS: 1.0000 | ORAL_TABLET | Freq: Every day | ORAL | Status: DC

## 2021-02-08 MED ORDER — METOPROLOL SUCCINATE ER 25 MG PO TB24
25.0000 mg | ORAL_TABLET | Freq: Every day | ORAL | Status: DC
  Administered 2021-02-09 – 2021-02-14 (×6): 25 mg via ORAL
  Filled 2021-02-08 (×7): qty 1

## 2021-02-08 MED ORDER — MECLIZINE HCL 25 MG PO TABS
25.0000 mg | ORAL_TABLET | Freq: Three times a day (TID) | ORAL | Status: DC | PRN
  Administered 2021-02-08 – 2021-02-09 (×2): 25 mg via ORAL
  Filled 2021-02-08 (×3): qty 1

## 2021-02-08 MED ORDER — TRAZODONE HCL 50 MG PO TABS: 25.0000 mg | ORAL_TABLET | Freq: Every evening | ORAL | Status: DC | PRN

## 2021-02-08 MED ORDER — MORPHINE SULFATE (PF) 2 MG/ML IV SOLN
2.0000 mg | INTRAVENOUS | Status: DC | PRN
  Administered 2021-02-08 – 2021-02-10 (×6): 2 mg via INTRAVENOUS
  Filled 2021-02-08 (×6): qty 1

## 2021-02-08 MED ORDER — TRAZODONE HCL 100 MG PO TABS: 200.0000 mg | ORAL_TABLET | Freq: Every day | ORAL | Status: DC

## 2021-02-08 MED ORDER — PREGABALIN 75 MG PO CAPS
75.0000 mg | ORAL_CAPSULE | Freq: Two times a day (BID) | ORAL | Status: DC
  Administered 2021-02-08 – 2021-02-14 (×13): 75 mg via ORAL
  Filled 2021-02-08 (×13): qty 1

## 2021-02-08 MED ORDER — ACETAMINOPHEN 650 MG RE SUPP: 650.0000 mg | Freq: Four times a day (QID) | RECTAL | Status: DC | PRN

## 2021-02-08 MED ORDER — GLIPIZIDE ER 2.5 MG PO TB24: 2.5000 mg | ORAL_TABLET | Freq: Every day | ORAL | Status: DC

## 2021-02-08 MED ORDER — MORPHINE SULFATE (PF) 4 MG/ML IV SOLN
4.0000 mg | Freq: Once | INTRAVENOUS | Status: AC
  Administered 2021-02-08: 4 mg via INTRAVENOUS
  Filled 2021-02-08: qty 1

## 2021-02-08 MED ORDER — DOXEPIN HCL 10 MG PO CAPS
10.0000 mg | ORAL_CAPSULE | Freq: Every day | ORAL | Status: DC
  Administered 2021-02-08 – 2021-02-13 (×6): 10 mg via ORAL
  Filled 2021-02-08 (×7): qty 1

## 2021-02-08 MED ORDER — MONTELUKAST SODIUM 10 MG PO TABS
10.0000 mg | ORAL_TABLET | Freq: Every day | ORAL | Status: DC
  Administered 2021-02-08 – 2021-02-13 (×6): 10 mg via ORAL
  Filled 2021-02-08 (×7): qty 1

## 2021-02-08 MED ORDER — LEVETIRACETAM 500 MG PO TABS
500.0000 mg | ORAL_TABLET | Freq: Two times a day (BID) | ORAL | Status: DC
  Administered 2021-02-08 – 2021-02-14 (×13): 500 mg via ORAL
  Filled 2021-02-08 (×14): qty 1

## 2021-02-08 MED ORDER — DIAZEPAM 5 MG PO TABS
10.0000 mg | ORAL_TABLET | Freq: Every evening | ORAL | Status: DC | PRN
  Administered 2021-02-08 – 2021-02-12 (×5): 10 mg via ORAL
  Filled 2021-02-08 (×5): qty 2

## 2021-02-08 MED ORDER — MORPHINE SULFATE (PF) 2 MG/ML IV SOLN: 2.0000 mg | INTRAVENOUS | Status: DC | PRN

## 2021-02-08 MED ORDER — DULOXETINE HCL 30 MG PO CPEP: 30.0000 mg | ORAL_CAPSULE | Freq: Every day | ORAL | Status: DC

## 2021-02-08 MED ORDER — CYCLOBENZAPRINE HCL 10 MG PO TABS: 10.0000 mg | ORAL_TABLET | Freq: Three times a day (TID) | ORAL | Status: DC | PRN

## 2021-02-08 MED ORDER — SODIUM CHLORIDE 0.9 % IV SOLN: INTRAVENOUS | Status: DC

## 2021-02-08 MED ORDER — ASPIRIN-ACETAMINOPHEN-CAFFEINE 250-250-65 MG PO TABS
2.0000 | ORAL_TABLET | Freq: Four times a day (QID) | ORAL | Status: DC | PRN
  Filled 2021-02-08: qty 2

## 2021-02-08 MED ORDER — BISMUTH SUBSALICYLATE 262 MG PO CHEW
262.0000 mg | CHEWABLE_TABLET | Freq: Three times a day (TID) | ORAL | Status: DC | PRN
  Filled 2021-02-08: qty 1

## 2021-02-08 MED ORDER — ISOSORBIDE MONONITRATE ER 30 MG PO TB24
60.0000 mg | ORAL_TABLET | Freq: Every day | ORAL | Status: DC
  Administered 2021-02-08 – 2021-02-14 (×7): 60 mg via ORAL
  Filled 2021-02-08 (×4): qty 2
  Filled 2021-02-08: qty 1
  Filled 2021-02-08 (×2): qty 2

## 2021-02-08 MED ORDER — METAXALONE 800 MG PO TABS: 800.0000 mg | ORAL_TABLET | Freq: Three times a day (TID) | ORAL | Status: DC

## 2021-02-08 MED ORDER — CITALOPRAM HYDROBROMIDE 20 MG PO TABS: 40.0000 mg | ORAL_TABLET | Freq: Every day | ORAL | Status: DC

## 2021-02-08 MED ORDER — FLUTICASONE PROPIONATE 50 MCG/ACT NA SUSP
2.0000 | Freq: Every day | NASAL | Status: DC | PRN
  Filled 2021-02-08: qty 16

## 2021-02-08 MED ORDER — AMLODIPINE BESYLATE 5 MG PO TABS
5.0000 mg | ORAL_TABLET | Freq: Two times a day (BID) | ORAL | Status: DC
  Administered 2021-02-08 – 2021-02-09 (×3): 5 mg via ORAL
  Filled 2021-02-08 (×3): qty 1

## 2021-02-08 MED ORDER — MORPHINE SULFATE (PF) 4 MG/ML IV SOLN: 4.0000 mg | INTRAVENOUS | Status: DC | PRN

## 2021-02-08 MED ORDER — ACETAMINOPHEN 325 MG PO TABS: 650.0000 mg | ORAL_TABLET | Freq: Four times a day (QID) | ORAL | Status: DC | PRN

## 2021-02-08 MED ORDER — HYDROCHLOROTHIAZIDE 25 MG PO TABS
25.0000 mg | ORAL_TABLET | Freq: Every day | ORAL | Status: DC
  Administered 2021-02-08 – 2021-02-13 (×6): 25 mg via ORAL
  Filled 2021-02-08 (×6): qty 1

## 2021-02-08 MED ORDER — LEVOTHYROXINE SODIUM 25 MCG PO TABS
125.0000 ug | ORAL_TABLET | Freq: Every day | ORAL | Status: DC
  Administered 2021-02-08 – 2021-02-14 (×7): 125 ug via ORAL
  Filled 2021-02-08: qty 1
  Filled 2021-02-08: qty 3
  Filled 2021-02-08 (×5): qty 1

## 2021-02-08 MED ORDER — PANTOPRAZOLE SODIUM 40 MG PO TBEC
40.0000 mg | DELAYED_RELEASE_TABLET | Freq: Every day | ORAL | Status: DC
  Administered 2021-02-08 – 2021-02-14 (×7): 40 mg via ORAL
  Filled 2021-02-08 (×7): qty 1

## 2021-02-08 MED ORDER — VITAMIN D 25 MCG (1000 UNIT) PO TABS
1000.0000 [IU] | ORAL_TABLET | Freq: Every day | ORAL | Status: DC
  Administered 2021-02-08 – 2021-02-14 (×7): 1000 [IU] via ORAL
  Filled 2021-02-08 (×7): qty 1

## 2021-02-08 MED ORDER — INSULIN ASPART 100 UNIT/ML IJ SOLN: 0.0000 [IU] | Freq: Three times a day (TID) | INTRAMUSCULAR | Status: DC

## 2021-02-08 MED ORDER — LOPERAMIDE HCL 2 MG PO CAPS: 2.0000 mg | ORAL_CAPSULE | ORAL | Status: DC | PRN

## 2021-02-08 MED ORDER — POTASSIUM CHLORIDE CRYS ER 10 MEQ PO TBCR
10.0000 meq | EXTENDED_RELEASE_TABLET | Freq: Two times a day (BID) | ORAL | Status: DC
  Administered 2021-02-08 – 2021-02-14 (×13): 10 meq via ORAL
  Filled 2021-02-08 (×13): qty 1

## 2021-02-08 MED ORDER — ENSURE ENLIVE PO LIQD
237.0000 mL | Freq: Two times a day (BID) | ORAL | Status: DC
  Administered 2021-02-09 – 2021-02-14 (×11): 237 mL via ORAL

## 2021-02-08 MED ORDER — ATORVASTATIN CALCIUM 20 MG PO TABS
40.0000 mg | ORAL_TABLET | Freq: Every day | ORAL | Status: DC
  Administered 2021-02-08 – 2021-02-13 (×6): 40 mg via ORAL
  Filled 2021-02-08 (×6): qty 2

## 2021-02-08 MED ORDER — OXYCODONE-ACETAMINOPHEN 5-325 MG PO TABS
ORAL_TABLET | ORAL | Status: DC | PRN
  Filled 2021-02-08 (×2): qty 1

## 2021-02-08 MED ORDER — LOSARTAN POTASSIUM 50 MG PO TABS
100.0000 mg | ORAL_TABLET | Freq: Every day | ORAL | Status: DC
  Administered 2021-02-08 – 2021-02-09 (×2): 100 mg via ORAL
  Filled 2021-02-08 (×3): qty 2

## 2021-02-08 MED ORDER — INSULIN ASPART 100 UNIT/ML IJ SOLN
0.0000 [IU] | Freq: Three times a day (TID) | INTRAMUSCULAR | Status: DC
  Administered 2021-02-09: 2 [IU] via SUBCUTANEOUS
  Administered 2021-02-09 (×2): 3 [IU] via SUBCUTANEOUS
  Administered 2021-02-10 (×2): 2 [IU] via SUBCUTANEOUS
  Administered 2021-02-10: 12:00:00 3 [IU] via SUBCUTANEOUS
  Administered 2021-02-10 – 2021-02-11 (×2): 2 [IU] via SUBCUTANEOUS
  Administered 2021-02-11: 1 [IU] via SUBCUTANEOUS
  Administered 2021-02-11 (×2): 2 [IU] via SUBCUTANEOUS
  Administered 2021-02-12: 3 [IU] via SUBCUTANEOUS
  Administered 2021-02-12 (×3): 2 [IU] via SUBCUTANEOUS
  Administered 2021-02-13 (×2): 5 [IU] via SUBCUTANEOUS
  Administered 2021-02-13: 08:00:00 2 [IU] via SUBCUTANEOUS
  Administered 2021-02-13: 12:00:00 5 [IU] via SUBCUTANEOUS
  Administered 2021-02-14: 12:00:00 7 [IU] via SUBCUTANEOUS
  Administered 2021-02-14: 08:00:00 3 [IU] via SUBCUTANEOUS
  Filled 2021-02-08 (×23): qty 1

## 2021-02-08 MED ORDER — DULOXETINE HCL 30 MG PO CPEP
60.0000 mg | ORAL_CAPSULE | Freq: Every day | ORAL | Status: DC
  Administered 2021-02-08 – 2021-02-14 (×7): 60 mg via ORAL
  Filled 2021-02-08 (×2): qty 2
  Filled 2021-02-08: qty 1
  Filled 2021-02-08 (×4): qty 2

## 2021-02-08 MED ORDER — ASPIRIN EC 81 MG PO TBEC
81.0000 mg | DELAYED_RELEASE_TABLET | Freq: Every day | ORAL | Status: DC
  Administered 2021-02-08: 81 mg via ORAL
  Filled 2021-02-08: qty 1

## 2021-02-08 NOTE — ED Provider Notes (Signed)
-----------------------------------------   23:30 PM on 02/07/2021 -----------------------------------------  Assumed care of patient who is awaiting repeat head CT around 2 AM.  If stable, will admit for rib fractures with <1% pneumothorax not requiring chest tube and pain control.   ----------------------------------------- 3:47 AM on 02/08/2021 -----------------------------------------  Patient awaiting results of repeat head CT.  She had Percocet earlier which somewhat helped her pain but her rib pain returned when she went to CT scan.  IV morphine was ordered.   ----------------------------------------- 4:17 AM on 02/08/2021 -----------------------------------------  Repeat CT head interpreted per Dr. Pascal Lux: No interval progression of the left subdural hematoma which measures  up to 5 mm in thickness.   Updated patient and spouse of repeat CT head results.  Will discuss with hospital services for admission for pain control due to rib fractures.  Ordered repeat chest x-ray for 7 AM.   Paulette Blanch, MD 02/08/21 [phone]

## 2021-02-08 NOTE — Progress Notes (Signed)
Patient seen and examined personally, I reviewed the chart, history and physical and admission note, done by admitting physician this morning and agree with the same with following addendum.  Please refer to the morning admission note for more detailed plan of care.  Briefly, 70 year old female with HTN,dyslipidemia,COPD on home oxygen 2 L,GERD, fibromyalgia,T2DM, anxiety disorder, osteoarthritis, hypothyroidism, history of PE off anticoagulation for 10 years presented with accidental mechanical fall at home, and since fall complaining of headache and a right-sided chest pain. In the ED,routine labs are stable except for hypokalemia hyponatremia,leukocytosis COVID-19 negative. CXR-no acute findings CT scan of the chest showed trace anterior right basilar pneumothorax less than 1%,minimally displaced right posterior lateral seventh rib fracture.  CT head with acute left convexity subdural hematoma measuring up to 5 mm thickness no midline shift right posterior scalp hematoma laceration.Neurosurgery was consulted CT scan was repeated 6 hours later with no interval progression of the left subdural hematoma measuring 5 mm. Patient continued of ongoing right-sided chest pain and surgery was consulted and patient was admitted for pain management.  On exam is still complaining of right chest pain.  Some headache on the right side.  Saturating well on home oxygen setting.  Mechanical fall Traumatic subdural hematoma-stable per 6 hr repeat CT head, right scalp hematoma Seen by neurosurgery Dr. Cari Caraway and he is planning for 1 month follow-up with repeat CT head. Will need follow-up with neurosurgery as outpatient with repeat CT head and continue Keppra 500 mg twice daily x1 week.  Trace anterior right basilar pneumothorax less than 1% volume loss Minimally displaced right posterior lateral seventh rib fracture Seen by surgery no further recommendation. Continue pain control current IV oral opiates, add  lidocaine patch, continue and encouraged incentive spirometry.  Chronic back pain managed with oral oxycodone at home Dyslipidemia on statin Anxiety/depression continue home Wellbutrin and Valium GERD continue PPI Mild hypokalemia/hyponatremia- on gentle hydration T2DM with peripheral neuropathy: Continue glipizide hold metformin, continue sliding scale and Neurontin and monitor CBC Recent Labs  Lab 02/08/21 1205  GLUCAP 132*   Essential hypertension: blood pressure controlled on Cozaar, HCTZ and Imdur  Anticipated discharge 1-2 days
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
## 2021-02-08 NOTE — ED Notes (Signed)
Pt provided incentive spirometer. Pt pulled around 750 on IS. Pt states she is still in a lot of pain in her head and chest. Will medicate per [facility] once meds verified by pharmacy

## 2021-02-08 NOTE — ED Notes (Signed)
Pharmacy at bedside for medication reconciliation.
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
## 2021-02-08 NOTE — Progress Notes (Signed)
PHARMACIST - PHYSICIAN COMMUNICATION  CONCERNING:  Enoxaparin (Lovenox) for DVT Prophylaxis    RECOMMENDATION: Patient was prescribed enoxaprin 40mg  q24 hours for VTE prophylaxis.   Filed Weights   02/07/21 1805  Weight: 86.2 kg (190 lb)    Body mass index is 31.62 kg/m.  Estimated Creatinine Clearance: 73 mL/min (by C-G formula based on SCr of 0.6 mg/dL).   Based on [place] patient is candidate for enoxaparin 0.5mg /kg TBW SQ every 24 hours based on BMI being >30.  DESCRIPTION: Pharmacy has adjusted enoxaparin dose per [facility] policy.  Patient is now receiving enoxaparin 0.5 mg/kg every 24 hours   Renda Rolls, PharmD, [facility] 02/08/2021 5:02 AM

## 2021-02-08 NOTE — Progress Notes (Signed)
Initial Nutrition Assessment  DOCUMENTATION CODES:   Obesity unspecified  INTERVENTION:   Ensure Enlive po BID, each supplement provides 350 kcal and 20 grams of protein  NUTRITION DIAGNOSIS:   Increased nutrient needs related to catabolic illness (COPD) as evidenced by estimated needs.  GOAL:   Patient will meet greater than or equal to 90% of their needs  MONITOR:   PO intake,Supplement acceptance,Labs,Weight trends,Skin,I & O's  REASON FOR ASSESSMENT:   Malnutrition Screening Tool    ASSESSMENT:   70 y.o. caucasian female with medical history significant for hypertension, dyslipidemia, COPD, GERD, fibromyalgia, type 2 diabetes mellitus, anxiety, osteoarthritis, hypothyroidism and history of PE who is admitted with SDH, rib fracture and pneumothorax after fall  RD working remotely.  Unable to reach pt by phone. Suspect pt with good appetite and oral intake at baseline. Per chart, pt is down 9lbs(5%) since September; RD unsure how recently weight loss occurred. RD will add supplements to help pt meet her estimated needs. RD will obtain nutrition related history and exam at follow-up.   Medications reviewed and include: aspirin, D3, insulin, synthroid, protonix, KCl  Labs reviewed: Na 134(L) Wbc- 10.9(H)  NUTRITION - FOCUSED PHYSICAL EXAM: Unable to perform at this time   Diet Order:   Diet Order            Diet Carb Modified Fluid consistency: Thin; Room service appropriate? Yes  Diet effective now                EDUCATION NEEDS:   No education needs have been identified at this time  Skin:  Skin Assessment: Reviewed RN Assessment  Last BM:  pta  Height:   Ht Readings from Last 1 Encounters:  02/07/21 5\' 5"  (1.651 m)    Weight:   Wt Readings from Last 1 Encounters:  02/07/21 86.2 kg    Ideal Body Weight:  56.8 kg  BMI:  Body mass index is 31.62 kg/m.  Estimated Nutritional Needs:   Kcal:  1900-2200kcal/day  Protein:   95-110g/day  Fluid:  1.7L/day  Koleen Distance MS, RD, LDN Please refer to [facility] for RD and/or RD on-call/weekend/after hours pager

## 2021-02-08 NOTE — ED Notes (Addendum)
Pt reports percocet helped a little earlier but pain has worsened since going to CT. Pt reports feeling more SOB as well, SpO2 100% on 2L [place]. Beather Arbour MD made aware
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
## 2021-02-08 NOTE — Progress Notes (Signed)
Pt arrived to 126 on 1C at this time for continued medical treatment

## 2021-02-09 DIAGNOSIS — S2231XA Fracture of one rib, right side, initial encounter for closed fracture: Secondary | ICD-10-CM | POA: Diagnosis not present

## 2021-02-09 LAB — GLUCOSE, CAPILLARY
Glucose-Capillary: 196 mg/dL — ABNORMAL HIGH (ref 70–99)
Glucose-Capillary: 205 mg/dL — ABNORMAL HIGH (ref 70–99)
Glucose-Capillary: 228 mg/dL — ABNORMAL HIGH (ref 70–99)
Glucose-Capillary: 175 mg/dL — ABNORMAL HIGH (ref 70–99)

## 2021-02-09 MED ORDER — ACETAMINOPHEN 325 MG PO TABS: 650.0000 mg | ORAL_TABLET | ORAL | 2 refills | Status: AC | PRN

## 2021-02-09 MED ORDER — VITAMIN D 1000 UNITS PO TABS
1000.0000 [IU] | ORAL_TABLET | Freq: Every day | ORAL | Status: AC
Start: 2021-02-09 — End: ?

## 2021-02-09 MED ORDER — LIDOCAINE 5 % EX PTCH: 1.0000 | MEDICATED_PATCH | CUTANEOUS | 0 refills | Status: DC

## 2021-02-09 MED ORDER — OXYCODONE HCL 5 MG PO TABS
5.0000 mg | ORAL_TABLET | Freq: Four times a day (QID) | ORAL | Status: DC | PRN
Start: 2021-02-09 — End: 2021-02-14
  Administered 2021-02-09 – 2021-02-13 (×7): 5 mg via ORAL
  Filled 2021-02-09 (×7): qty 1

## 2021-02-09 MED ORDER — AMLODIPINE BESYLATE 10 MG PO TABS: 5.0000 mg | ORAL_TABLET | Freq: Two times a day (BID) | ORAL | Status: DC

## 2021-02-09 MED ORDER — LIDOCAINE 5 % EX PTCH
1.0000 | MEDICATED_PATCH | CUTANEOUS | Status: DC
  Administered 2021-02-09 – 2021-02-14 (×6): 1 via TRANSDERMAL
  Filled 2021-02-09 (×9): qty 1

## 2021-02-09 MED ORDER — GUAIFENESIN-DM 100-10 MG/5ML PO SYRP
10.0000 mL | ORAL_SOLUTION | ORAL | Status: DC | PRN
  Administered 2021-02-09 – 2021-02-13 (×8): 10 mL via ORAL
  Filled 2021-02-09 (×8): qty 10

## 2021-02-09 MED ORDER — OXYCODONE-ACETAMINOPHEN 5-325 MG PO TABS
1.0000 | ORAL_TABLET | ORAL | Status: DC | PRN
  Administered 2021-02-09 – 2021-02-14 (×6): 1 via ORAL
  Filled 2021-02-09 (×7): qty 1

## 2021-02-09 NOTE — Progress Notes (Signed)
PROGRESS NOTE    Belinda Garcia  UYQ:[id number] DOB: 08/06/1951 DOA: 02/07/2021 PCP: [place] Pink, MD   Chief Complaint  Patient presents with  . Fall  . 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 In the ED,routine labs are stable except for hypokalemia hyponatremia,leukocytosis COVID-19 negative. CXR-no acute findings CT scan of the chest showed trace anterior right basilar pneumothorax less than 1%,minimally displaced right posterior lateral seventh rib fracture.  CT head with acute left convexity subdural hematoma measuring up to 5 mm thickness no midline shift right posterior scalp hematoma laceration.Neurosurgery was consulted CT scan was repeated 6 hours later with no interval progression of the left subdural hematoma measuring 5 mm. Patient continued of ongoing right-sided chest pain and surgery was consulted and patient was admitted for pain management. Pain pain has been controlled on lidocaine patch oral Oxy, Tylenol.  Being followed by PT OT did not do very well and skilled nursing facility has been suggested 5/9.  Subjective: Seen and examined this morning. Her oxygen was off and she was eating her meal. Complains of dizziness/blurred vision Does have chronic dizziness Repeat CT head ordered due to her dizziness  Assessment & Plan:  Traumatic subdural hematoma Right scalp hematoma s/p staples: Seen by neurosurgery Dr. Cari Caraway, CT head stable x2.He is planning for 1 month follow-up with repeat CT head and okay for discharge.  Complaining of some dizziness and blurred vision and repeat CT head ordered appears stable.  Patient was off oxygen while eating and did not put it back on  likely contributing to worsening of her dizziness and weakness and blurry vision.Educated patient, orthostatic will be monitored again during PT.Right scalp staples in place- can probably remove it here prior to d/c if heals well-as still awaiting SNF. I have discontnued patient's Excedrin, aspirin, Mobic and advised not to take any NSAIDs/blood thinners for now, no heparin/lovenox.   Trace anterior right basilar pneumothorax less than 1% volume loss Minimally displaced right posterior lateral seventh rib fracture Seen by surgery no acute intervention needed for small pneumothorax.  Continue pain control for her chest wall pain -encourage incentive spirometry.  Pain is controlled, continue on oxycodone, Tylenol lidocaine patch.  COPD/chronic hypoxic respiratory failure on 2 L of cannula.  Continue same continue bronchodilators encourage incentive spirometry.Educated on keeping oxygen on at the time  Leukocytosis likely reactive, high on admission and down trended now a in12k. Afebrile.  Encouraged use of incentive spirometry explained the risk of pneumonia. Recent Labs  Lab 02/07/21 1945 02/08/21 0532 02/11/21 0815  WBC 16.9* 10.9* 12.5*   Chronic back pain managed with oral oxycodone at home.  She is followed by neurosurgery as outpatient. Dyslipidemia on statin Anxiety/depression monitor pressure stable, continue home Wellbutrin and Valium.  Mood is stable GERD continue PPI Mild hypokalemia/hyponatremia- stable T2DM with peripheral neuropathy:  Blood sugar well controlled on sliding scale insulin. resume glipizide metformin on d.c cont Neurontin. Recent Labs  Lab 02/11/21 1149 02/11/21 1643 02/11/21 2127 02/12/21 0724 02/12/21 1218  GLUCAP 166* 178* 174* 160* 153*   Essential hypertension:  Blood pressure has been soft so discontinued amlodipine and losartan.  Okay today so we will keep on her home HCTZ metoprolol and Imdur.    Morbid obesity with BMI 31 will benefit weight  loss.  Fall at home with physical deconditioning/ dizziness/chronic right knee pain/chronic low back pain: Negative for orthostatic with physical therapy 5/9, ask PT to check orthostatic vitals again.  Repeat CT head no acute finding.  Nonfocal on exam.Has had chronic dizziness issue and she takes meclizine, continue the same.PT advised SNF has been reluctant but at this time after extensive discussion she is agreeable. She lives with her demented husband that she takes care of him ( he needs to be instructed and is strong physically per her). She has a son living with her but he goes to work at 6 AM comes back around 5 PM. Will benefit with a skilled nursing facility placement, TOC looking for bed.   Diet Order            Diet - low sodium heart healthy           Diet Carb Modified Fluid consistency: Thin; Room service appropriate? Yes  Diet effective now                 Nutrition Problem: Increased nutrient needs Etiology: catabolic illness (COPD) Signs/Symptoms: estimated needs   Patient's Body mass index is 31.62 kg/m.  DVT prophylaxis: Place and maintain sequential compression device Start: 02/10/21 1248 Place and maintain sequential compression device Start: 02/08/21 0612 Code Status:   Code Status: Full Code  Family Communication: plan of care discussed with patient at bedside.  Son was updated at the bedside previously Status is: Inpatient Remains inpatient appropriate because:Inpatient level of care appropriate due to severity of illness  Dispo: The patient is from: Home              Anticipated d/c is to: SNF              Patient currently is medically stable for discharge   Difficult to place patient No  Unresulted Labs (From admission, onward)         None      Medications reviewed:  Scheduled Meds: . atorvastatin  40 mg Oral QHS  . buPROPion  150 mg Oral q morning  . cholecalciferol  1,000 Units Oral Daily  . doxepin  10 mg Oral QHS  . DULoxetine  60 mg  Oral Daily  . feeding supplement  237 mL Oral BID BM  . hydrochlorothiazide  25 mg Oral Daily  . insulin aspart  0-9 Units Subcutaneous TID AC & HS  . isosorbide mononitrate  60 mg Oral Daily  . levETIRAcetam  500 mg Oral BID  . levothyroxine  125 mcg Oral Q0600  . lidocaine  1 patch Transdermal Q24H  . lidocaine  1 patch Transdermal Q24H  . metoprolol succinate  25 mg Oral Daily  . montelukast  10 mg Oral QHS  . pantoprazole  40 mg Oral Daily  . potassium chloride  10 mEq Oral BID  . pregabalin  75 mg Oral BID  . senna-docusate  1 tablet Oral Q0600   Continuous Infusions:   Consultants:see note  Procedures:see note  Antimicrobials: Anti-infectives (From admission, onward)   None     Culture/Microbiology    Component Value Date/Time   SDES URINE, RANDOM 04/02/2017 1931   SPECREQUEST NONE 04/02/2017 1931   CULT MULTIPLE SPECIES PRESENT, SUGGEST RECOLLECTION (A) 04/02/2017 1931   REPTSTATUS 04/04/2017 FINAL 04/02/2017 1931    Other culture-see note  Objective: Vitals: Today's Vitals   02/12/21 0557 02/12/21 0642 02/12/21 0815 02/12/21 1221  BP:   (!) 129/59 102/63  Pulse:  82 70  Resp:   16 16  Temp:    98.7 F (37.1 C)  TempSrc:      SpO2:   93% 98%  Weight:      Height:      PainSc: 5  Asleep 0-No pain     Intake/Output Summary (Last 24 hours) at 02/12/2021 1318 Last data filed at 02/12/2021 1025 Gross per 24 hour  Intake 480 ml  Output 1700 ml  Net -1220 ml   Filed Weights   02/07/21 1805  Weight: 86.2 kg   Weight change:   Intake/Output from previous day: 05/09 0701 - 05/10 0700 In: 240 [P.O.:240] Out: 1700 [Urine:1700] Intake/Output this shift: Total I/O In: 240 [P.O.:240] Out: -  Filed Weights   02/07/21 1805  Weight: 86.2 kg   Examination: General exam: AAOx 3 older than stated age, weak appearing. HEENT:Oral mucosa moist, Ear/Nose WNL grossly, dentition normal. Respiratory system: bilaterally diminished, NO CRACKLES,, no use of  accessory muscle Cardiovascular system: S1 & S2 +, No JVD,. Gastrointestinal system: Abdomen soft, NT,ND, BS+ Nervous System:Alert, awake, moving extremities and grossly nonfocal Extremities: NO edema, distal peripheral pulses palpable.  Skin: No rashes,no icterus. MSK: Normal muscle bulk,tone, power Right scalp wound healing with staples in place  Data Reviewed: I have personally reviewed following labs and imaging studies CBC: Recent Labs  Lab 02/07/21 1945 02/08/21 0532 02/11/21 0815  WBC 16.9* 10.9* 12.5*  HGB 13.3 13.5 14.0  HCT 38.9 39.8 43.1  MCV 97.0 99.3 101.2*  PLT 317 310 [id number]   Basic Metabolic Panel: Recent Labs  Lab 02/07/21 1945 02/08/21 0532 02/11/21 0815  NA 129* 134* 134*  K 3.3* 4.2 4.2  CL 93* 96* 96*  CO2 27 29 30   GLUCOSE 112* 130* 176*  BUN 11 11 17   CREATININE 0.60 0.65 0.58  CALCIUM 9.0 8.8* 9.3   GFR: Estimated Creatinine Clearance: 73 mL/min (by C-G formula based on SCr of 0.58 mg/dL). Liver Function Tests: Recent Labs  Lab 02/07/21 1945  AST 23  ALT 23  ALKPHOS 63  BILITOT 0.9  PROT 6.7  ALBUMIN 3.9   No results for input(s): LIPASE, AMYLASE in the last 168 hours. No results for input(s): AMMONIA in the last 168 hours. Coagulation Profile: Recent Labs  Lab 02/07/21 1945  INR 1.0   Cardiac Enzymes: No results for input(s): CKTOTAL, CKMB, CKMBINDEX, TROPONINI in the last 168 hours. BNP (last 3 results) No results for input(s): PROBNP in the last 8760 hours. HbA1C: No results for input(s): HGBA1C in the last 72 hours. CBG: Recent Labs  Lab 02/11/21 1149 02/11/21 1643 02/11/21 2127 02/12/21 0724 02/12/21 1218  GLUCAP 166* 178* 174* 160* 153*   Lipid Profile: No results for input(s): CHOL, HDL, LDLCALC, TRIG, CHOLHDL, LDLDIRECT in the last 72 hours. Thyroid Function Tests: No results for input(s): TSH, T4TOTAL, FREET4, T3FREE, THYROIDAB in the last 72 hours. Anemia Panel: No results for input(s): VITAMINB12, FOLATE,  FERRITIN, TIBC, IRON, RETICCTPCT in the last 72 hours. Sepsis Labs: No results for input(s): PROCALCITON, LATICACIDVEN in the last 168 hours.  Recent Results (from the past 240 hour(s))  Resp Panel by RT-PCR (Flu A&B, Covid) Nasopharyngeal Swab     Status: None   Collection Time: 02/07/21  7:45 PM   Specimen: Nasopharyngeal Swab; Nasopharyngeal(NP) swabs in vial transport medium  Result Value Ref Range Status   SARS Coronavirus 2 by RT PCR NEGATIVE NEGATIVE Final    Comment: (NOTE) SARS-CoV-2 target nucleic acids are NOT DETECTED.  The SARS-CoV-2 RNA is generally detectable in upper respiratory specimens during the acute phase of infection. The lowest concentration of SARS-CoV-2 viral copies this assay can detect is 138 copies/mL. A negative result does not preclude SARS-Cov-2 infection and should not be used as the sole basis for treatment or other patient management decisions. A negative result may occur with  improper specimen collection/handling, submission of specimen other than nasopharyngeal swab, presence of viral mutation(s) within the areas targeted by this assay, and inadequate number of viral copies(<138 copies/mL). A negative result must be combined with clinical observations, patient history, and epidemiological information. The expected result is Negative.  Fact Sheet for Patients:  EntrepreneurPulse.com.au  Fact Sheet for Healthcare Providers:  IncredibleEmployment.be  This test is no t yet approved or cleared by the [place] FDA and  has been authorized for detection and/or diagnosis of SARS-CoV-2 by FDA under an Emergency Use Authorization (EUA). This EUA will remain  in effect (meaning this test can be used) for the duration of the COVID-19 declaration under Section 564(b)(1) of the Act, 21 U.S.C.section 360bbb-3(b)(1), unless the authorization is terminated  or revoked sooner.       Influenza A by PCR NEGATIVE  NEGATIVE Final   Influenza B by PCR NEGATIVE NEGATIVE Final    Comment: (NOTE) The Xpert Xpress SARS-CoV-2/FLU/RSV plus assay is intended as an aid in the diagnosis of influenza from Nasopharyngeal swab specimens and should not be used as a sole basis for treatment. Nasal washings and aspirates are unacceptable for Xpert Xpress SARS-CoV-2/FLU/RSV testing.  Fact Sheet for Patients: EntrepreneurPulse.com.au  Fact Sheet for Healthcare Providers: IncredibleEmployment.be  This test is not yet approved or cleared by the [place] FDA and has been authorized for detection and/or diagnosis of SARS-CoV-2 by FDA under an Emergency Use Authorization (EUA). This EUA will remain in effect (meaning this test can be used) for the duration of the COVID-19 declaration under Section 564(b)(1) of the Act, 21 U.S.C. section 360bbb-3(b)(1), unless the authorization is terminated or revoked.  Performed at [facility] [address] [place], [place] [address]      Radiology Studies: CT HEAD WO CONTRAST  Result Date: 02/12/2021 CLINICAL DATA:  Recent subdural hematoma EXAM: CT HEAD WITHOUT CONTRAST TECHNIQUE: Contiguous axial images were obtained from the base of the skull through the vertex without intravenous contrast. COMPARISON:  Feb 08, 2021 FINDINGS: Brain: There is again noted a subdural hematoma with increased attenuation fluid in the left superior temporal region with a maximum thickness of 5 mm near the sylvian fissure, unchanged. No progression or new subdural hematoma is appreciated. This subdural hematoma causes modest localized mass effect, stable, without midline shift. No intra-axial hemorrhage noted. No mass. There is mild age related volume loss. There is mild decreased attenuation in the periventricular white matter. No acute infarct is evident. Vascular: No hyperdense vessel. Calcification in each carotid siphon region is stable. Skull:  Bony calvarium appears intact.  Stable scalp staples. Sinuses/Orbits: Air-fluid level left maxillary antrum. Mucosal thickening in left sphenoid sinus and in multiple ethmoid air cells. Small air-fluid levels in each inferior frontal sinus. Orbits appear symmetric bilaterally except for evidence of previous cataract removal on the left. Other: Mastoid air cells clear. Apparent skin nevus right frontal region. IMPRESSION: Stable left-sided temporal region subdural hematoma measuring 5 mm in maximal thickness. Modest mass effect locally. No appreciable new or progressing extra-axial fluid collection. No intra-axial hemorrhage. There is mild periventricular small vessel disease. No acute infarct evident. No  midline shift. Foci of arterial vascular calcification noted. Multiple foci of paranasal sinus disease noted. Electronically Signed   By: Lowella Grip III M.D.   On: 02/12/2021 11:34     LOS: 4 days   Antonieta Pert, MD [facility]  02/12/2021, 1:18 PM

## 2021-02-09 NOTE — Evaluation (Signed)
Occupational Therapy Evaluation Patient Details Name: Belinda Garcia MRN: [id number] DOB: Nov 08, 1950 Today's Date: 02/09/2021    History of Present Illness Pt is a 70 y/o F admitted with c/c of fall & hit her head with short LOC. Pt found to have minimally displaced R posterior lateral 7th rib fx, pneumothorax of less than 1%, & acute L convexity SDH measuring up to 5 mm thickness with no midline shift. PMH: HTN, dyslipidemia, COPD on home O2, GERD, fibromyalgia, DM2, anxiety, OA, hypothyroidism, hx of PE   Clinical Impression   Pt seen for OT evaluation this date in setting of acute hospitalization d/t fall. Pt reports being INDEP at baseline with ADLs and MOD I with mobility with QC or sometimes walker. Pt states she lives with her spouse that she acts as caregiver for including driving, running errands and med mgt. Pt presents this date with some mild balance deficits and decreased strength requiring MIN A for bed mobility and MIN A/CGA for ADL transfers with QC. Pt requires cues to control descent from standing to sitting both for transfer to the commode and to the chair. In addition, d/t R side pain, pt requires MOD A For LB dressing. Pt states her spouse is able bodied despite cognitive deficits and able to assist in the home with physical needs if she supervises. In addition, pt's sons in the area both work during the day, but are involved in pt's care and available at night. Will continue to follow acutely. Anticipate pt can return home with [place] f/u to ensure safety with ADL transfers. Recommend pt place [facility] over her standard commode to elevate and provide handles on both sides to reduce pt's risk of falling with toileting as this is where she fell prior to admission.     Follow Up Recommendations  Home health OT;Supervision - Intermittent    Equipment Recommendations  3 in 1 bedside commode    Recommendations for Other Services       Precautions / Restrictions Precautions Precautions:  Fall Restrictions Weight Bearing Restrictions: No      Mobility Bed Mobility Overal bed mobility: Needs Assistance Bed Mobility: Supine to Sit     Supine to sit: Min assist;HOB elevated     General bed mobility comments: increased time, effortful    Transfers Overall transfer level: Needs assistance Equipment used: Quad cane Transfers: Sit to/from [employer] to Stand: Min guard Stand pivot transfers: Min guard;Min assist       General transfer comment: increased assist to/from lower surfaces, poor control of descent    Balance Overall balance assessment: Needs assistance Sitting-balance support: Feet supported Sitting balance-Leahy Scale: Good     Standing balance support: Single extremity supported Standing balance-Leahy Scale: Fair                             ADL either performed or assessed with clinical judgement   ADL Overall ADL's : Needs assistance/impaired                                       General ADL Comments: SETUP to MIN A for seated UB ADLs, MOD A for seated LB ADLs (to don socks to R LE) and requires CGA/MIN A for ADL transfers, CGA/SBA For fxl mobility with QC     Vision Patient Visual Report: No change from baseline  Perception     Praxis      Pertinent Vitals/Pain Pain Assessment: Faces Faces Pain Scale: Hurts even more Pain Location: R ribs with Sit<>stand Pain Descriptors / Indicators: Guarding;Tender;Grimacing Pain Intervention(s): Limited activity within patient's tolerance;Monitored during session;Repositioned     Hand Dominance     Extremity/Trunk Assessment Upper Extremity Assessment Upper Extremity Assessment: RUE deficits/detail;LUE deficits/detail RUE Deficits / Details: shld flexion tolerance limited at this time d/t R side pain LUE Deficits / Details: WFL   Lower Extremity Assessment Lower Extremity Assessment: Defer to PT evaluation;RLE deficits/detail;LLE  deficits/detail RLE Deficits / Details: ROM limited at baseline, pt with 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 LLE Deficits / Details: ROM WFL, MMT grossly 4-/5       Communication Communication Communication: No difficulties   Cognition Arousal/Alertness: Awake/alert Behavior During Therapy: WFL for tasks assessed/performed Overall Cognitive Status: Within Functional Limits for tasks assessed                                     General Comments       Exercises Other Exercises Other Exercises: OT facilitates ed re: safety with mobility including transfers and protective considerations for transfers considering R side pain. Pt with good understanding.   Shoulder Instructions      Home Living Family/patient expects to be discharged to:: Private residence Living Arrangements: Spouse/significant other (spouse has dementia, pt acts as caregiver) Available Help at Discharge: Family;Available PRN/intermittently (sons live in the area, but both work) Type of Home: House       Home Layout: Two level;Able to live on main level with bedroom/bathroom Alternate Level Stairs-Number of Steps: 12             Home Equipment: Walker - 2 wheels;Shower seat;Cane - quad          Prior Functioning/Environment Level of Independence: Independent with assistive device(s)        Comments: QC primarily for fxl mobility. Pt drives and performs cooking, cleaning, and all IADLs including managing her husband's medication        OT Problem List: Decreased strength;Decreased activity tolerance;Cardiopulmonary status limiting activity;Pain      OT Treatment/Interventions: Self-care/ADL training;Therapeutic activities;Patient/family education    OT Goals(Current goals can be found in the care plan section) Acute Rehab OT Goals Patient Stated Goal: to get stronger and be able to go home OT Goal Formulation: With patient Time For Goal Achievement: 02/23/21 Potential to Achieve Goals:  Good ADL Goals Pt Will Perform Lower Body Dressing: with supervision;sit to/from stand (with modified technique/AE PRN) Pt Will Transfer to Toilet: with supervision;ambulating;grab bars;bedside commode (BSC to elevate)  OT Frequency: Min 1X/week   Barriers to D/C:            Co-evaluation              AM-PAC OT "6 Clicks" Daily Activity     Outcome Measure Help from another person eating meals?: None Help from another person taking care of personal grooming?: None Help from another person toileting, which includes using toliet, bedpan, or urinal?: A Little Help from another person bathing (including washing, rinsing, drying)?: A Little Help from another person to put on and taking off regular upper body clothing?: A Little Help from another person to put on and taking off regular lower body clothing?: A Little 6 Click Score: 20   End of Session Equipment Utilized During  Treatment: Other (comment) (QC) Nurse Communication: Mobility status  Activity Tolerance: Patient tolerated treatment well;Patient limited by pain Patient left: in chair;with call bell/phone within reach;with family/visitor present  OT Visit Diagnosis: Unsteadiness on feet (R26.81);Muscle weakness (generalized) (M62.81);Pain                Time: 1320-1400 OT Time Calculation (min): 40 min Charges:  OT General Charges $OT Visit: 1 Visit OT Evaluation $OT Eval Moderate Complexity: 1 Mod OT Treatments $Self Care/Home Management : 8-22 mins $Therapeutic Activity: 8-22 mins  Gerrianne Scale, MS, OTR/L ascom [phone] 02/09/21, 3:01 PM

## 2021-02-09 NOTE — Progress Notes (Addendum)
PROGRESS NOTE    Belinda Garcia  ZWC:[id number] DOB: May 27, 1951 DOA: 02/07/2021 PCP: [place] Pink, MD   Chief Complaint  Patient presents with  . Fall  . 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 In the ED,routine labs are stable except for hypokalemia hyponatremia,leukocytosis COVID-19 negative. CXR-no acute findings CT scan of the chest showed trace anterior right basilar pneumothorax less than 1%,minimally displaced right posterior lateral seventh rib fracture.  CT head with acute left convexity subdural hematoma measuring up to 5 mm thickness no midline shift right posterior scalp hematoma laceration.Neurosurgery was consulted CT scan was repeated 6 hours later with no interval progression of the left subdural hematoma measuring 5 mm. Patient continued of ongoing right-sided chest pain and surgery was consulted and patient was admitted for pain management.  Subjective: Seen this morning still complains of pain on the chest mostly anterior, has a bruise on the right posterior chest. Complains of pain at right side of the head and the site of staple   Assessment & Plan:  Traumatic subdural hematoma-stable per 6 hr repeat CT head Right scalp hematoma s/p staples. Seen by neurosurgery Dr. Cari Caraway and he is planning for 1 month follow-up with repeat CT head.  No further monitoring advised patient regarding Neurological deficit or complaint . Cont keppra x 1 wk- Rx already sent by EDP.Right scalp staple in place will need follow-up in the ED to remove a staple. I have discontnued patient's Excedrin, aspirin, Mobic and advised not to take any NSAIDs/blood thinners for now.  Trace anterior  right basilar pneumothorax less than 1% volume loss Minimally displaced right posterior lateral seventh rib fracture Seen by surgery no further recommendation.  Continue pain control with all home oxycodone, added lidocaine patches, encouraged incentive spirometry.  COPD/chronic hypoxic respiratory failure on 2 L of cannula continue the same, bronchodilators IS  Leukocytosis likely reactive has improved   Chronic back pain managed with oral oxycodone at home Dyslipidemia on statin Anxiety/depression continue home Wellbutrin and Valium GERD continue PPI Mild hypokalemia/hyponatremia- stable T2DM with peripheral neuropathy:  She will continue her home regimen of glipizide metformin and Neurontin. Essential hypertension: blood pressure controlled cont home meds- amlodipine, Cozaar, HCTZ and Imdur. Morbid obesity with BMI 31 will benefit weight loss.  Fall at home with physical deconditioning dizziness: PT OT eval requested, she did not do very well with OT but did fairly okay with PT.  Patient reports today is the first time she got up and she is not quite there to go home and take care of her husband with dementia so would like another day in the hospital for continued pain control, continue further PT OT eval.   Diet Order            Diet Carb Modified Fluid consistency: Thin; Room service appropriate? Yes  Diet effective now                 Nutrition Problem: Increased nutrient needs Etiology: catabolic illness (COPD) Signs/Symptoms: estimated needs   Patient's Body mass index is 31.62 kg/m.  DVT prophylaxis: Place and maintain sequential compression device Start: 02/08/21 0612 Code Status:   Code Status: Full Code  Family Communication: plan of care discussed with patient at bedside.  Status is: Inpatient Remains inpatient  appropriate because:Inpatient level of care appropriate due to severity of illness  Dispo: The patient is from: Home              Anticipated d/c is to:  SNF              Patient currently is medically stable to d/c.   Difficult to place patient No  Unresulted Labs (From admission, onward)          Start     Ordered   02/08/21 0453  CBC WITH DIFFERENTIAL  Once,   STAT        02/08/21 0456          Medications reviewed:  Scheduled Meds: . amLODipine  5 mg Oral BID  . atorvastatin  40 mg Oral QHS  . buPROPion  150 mg Oral q morning  . cholecalciferol  1,000 Units Oral Daily  . doxepin  10 mg Oral QHS  . DULoxetine  60 mg Oral Daily  . feeding supplement  237 mL Oral BID BM  . hydrochlorothiazide  25 mg Oral Daily  . insulin aspart  0-9 Units Subcutaneous TID AC & HS  . isosorbide mononitrate  60 mg Oral Daily  . levETIRAcetam  500 mg Oral BID  . levothyroxine  125 mcg Oral Q0600  . lidocaine  1 patch Transdermal Q24H  . losartan  100 mg Oral Daily  . metoprolol succinate  25 mg Oral Daily  . montelukast  10 mg Oral QHS  . pantoprazole  40 mg Oral Daily  . potassium chloride  10 mEq Oral BID  . pregabalin  75 mg Oral BID   Continuous Infusions: . sodium chloride 75 mL/hr at 02/08/21 2123    Consultants:see note  Procedures:see note  Antimicrobials: Anti-infectives (From admission, onward)   None     Culture/Microbiology    Component Value Date/Time   SDES URINE, RANDOM 04/02/2017 1931   SPECREQUEST NONE 04/02/2017 1931   CULT MULTIPLE SPECIES PRESENT, SUGGEST RECOLLECTION (A) 04/02/2017 1931   REPTSTATUS 04/04/2017 FINAL 04/02/2017 1931    Other culture-see note  Objective: Vitals: Today's Vitals   02/09/21 0823 02/09/21 1116 02/09/21 1149 02/09/21 1212  BP: 125/71   (!) 128/59  Pulse: 93   79  Resp: 17   16  Temp: 98.5 F (36.9 C)   97.8 F (36.6 C)  TempSrc: Oral   Oral  SpO2: 94%   96%  Weight:      Height:      PainSc: 0-No pain 5  0-No pain     Intake/Output Summary (Last 24 hours) at 02/09/2021 1417 Last data filed at 02/09/2021 1037 Gross per 24 hour  Intake 1756.77 ml  Output 1000 ml   Net 756.77 ml   Filed Weights   02/07/21 1805  Weight: 86.2 kg   Weight change:   Intake/Output from previous day: 05/06 0701 - 05/07 0700 In: 1516.8 [P.O.:600; I.V.:916.8] Out: 1000 [Urine:1000] Intake/Output this shift: Total I/O In: 240 [P.O.:240] Out: -  Filed Weights   02/07/21 1805  Weight: 86.2 kg   Examination: General exam: AAO X3 , older than stated age, weak appearing. HEENT:Oral mucosa moist, Ear/Nose WNL grossly,dentition normal. Respiratory system: bilaterally diminished, no crackles chest tender on anteriorly and right chest with bruise no use of accessory muscle, non tender. Cardiovascular system: S1 & S2 +,o JVD. Gastrointestinal system: Abdomen soft, NT,ND, BS+. Nervous System:Alert, awake, moving extremities Extremities:no edema, distal peripheral pulses palpable.  Skin: No rashes,no icterus. MSK:  Normal muscle bulk,tone, power  Data Reviewed: I have personally reviewed following labs and imaging studies CBC: Recent Labs  Lab 02/07/21 1945 02/08/21 0532  WBC 16.9* 10.9*  HGB 13.3 13.5  HCT 38.9 39.8  MCV 97.0 99.3  PLT 317 696   Basic Metabolic Panel: Recent Labs  Lab 02/07/21 1945 02/08/21 0532  NA 129* 134*  K 3.3* 4.2  CL 93* 96*  CO2 27 29  GLUCOSE 112* 130*  BUN 11 11  CREATININE 0.60 0.65  CALCIUM 9.0 8.8*   GFR: Estimated Creatinine Clearance: 73 mL/min (by C-G formula based on SCr of 0.65 mg/dL). Liver Function Tests: Recent Labs  Lab 02/07/21 1945  AST 23  ALT 23  ALKPHOS 63  BILITOT 0.9  PROT 6.7  ALBUMIN 3.9   No results for input(s): LIPASE, AMYLASE in the last 168 hours. No results for input(s): AMMONIA in the last 168 hours. Coagulation Profile: Recent Labs  Lab 02/07/21 1945  INR 1.0   Cardiac Enzymes: No results for input(s): CKTOTAL, CKMB, CKMBINDEX, TROPONINI in the last 168 hours. BNP (last 3 results) No results for input(s): PROBNP in the last 8760 hours. HbA1C: Recent Labs    02/08/21 0532   HGBA1C 6.8*   CBG: Recent Labs  Lab 02/08/21 1205 02/08/21 1651 02/08/21 2047 02/09/21 0820 02/09/21 1208  GLUCAP 132* 147* 158* 196* 205*   Lipid Profile: No results for input(s): CHOL, HDL, LDLCALC, TRIG, CHOLHDL, LDLDIRECT in the last 72 hours. Thyroid Function Tests: No results for input(s): TSH, T4TOTAL, FREET4, T3FREE, THYROIDAB in the last 72 hours. Anemia Panel: No results for input(s): VITAMINB12, FOLATE, FERRITIN, TIBC, IRON, RETICCTPCT in the last 72 hours. Sepsis Labs: No results for input(s): PROCALCITON, LATICACIDVEN in the last 168 hours.  Recent Results (from the past 240 hour(s))  Resp Panel by RT-PCR (Flu A&B, Covid) Nasopharyngeal Swab     Status: None   Collection Time: 02/07/21  7:45 PM   Specimen: Nasopharyngeal Swab; Nasopharyngeal(NP) swabs in vial transport medium  Result Value Ref Range Status   SARS Coronavirus 2 by RT PCR NEGATIVE NEGATIVE Final    Comment: (NOTE) SARS-CoV-2 target nucleic acids are NOT DETECTED.  The SARS-CoV-2 RNA is generally detectable in upper respiratory specimens during the acute phase of infection. The lowest concentration of SARS-CoV-2 viral copies this assay can detect is 138 copies/mL. A negative result does not preclude SARS-Cov-2 infection and should not be used as the sole basis for treatment or other patient management decisions. A negative result may occur with  improper specimen collection/handling, submission of specimen other than nasopharyngeal swab, presence of viral mutation(s) within the areas targeted by this assay, and inadequate number of viral copies(<138 copies/mL). A negative result must be combined with clinical observations, patient history, and epidemiological information. The expected result is Negative.  Fact Sheet for Patients:  EntrepreneurPulse.com.au  Fact Sheet for Healthcare Providers:  IncredibleEmployment.be  This test is no t yet approved or  cleared by the [place] FDA and  has been authorized for detection and/or diagnosis of SARS-CoV-2 by FDA under an Emergency Use Authorization (EUA). This EUA will remain  in effect (meaning this test can be used) for the duration of the COVID-19 declaration under Section 564(b)(1) of the Act, 21 U.S.C.section 360bbb-3(b)(1), unless the authorization is terminated  or revoked sooner.       Influenza A by PCR NEGATIVE NEGATIVE Final   Influenza B by PCR NEGATIVE NEGATIVE Final    Comment: (NOTE) The Xpert Xpress  SARS-CoV-2/FLU/RSV plus assay is intended as an aid in the diagnosis of influenza from Nasopharyngeal swab specimens and should not be used as a sole basis for treatment. Nasal washings and aspirates are unacceptable for Xpert Xpress SARS-CoV-2/FLU/RSV testing.  Fact Sheet for Patients: EntrepreneurPulse.com.au  Fact Sheet for Healthcare Providers: IncredibleEmployment.be  This test is not yet approved or cleared by the [place] FDA and has been authorized for detection and/or diagnosis of SARS-CoV-2 by FDA under an Emergency Use Authorization (EUA). This EUA will remain in effect (meaning this test can be used) for the duration of the COVID-19 declaration under Section 564(b)(1) of the Act, 21 U.S.C. section 360bbb-3(b)(1), unless the authorization is terminated or revoked.  Performed at [facility], [address], [place], [place] [address]      Radiology Studies: DG Chest 2 View  Result Date: 02/08/2021 CLINICAL DATA:  Pain following fall. EXAM: CHEST - 2 VIEW COMPARISON:  Chest radiograph and rib images; chest CT Feb 07, 2021 FINDINGS: There is no appreciable pneumothorax by radiography. There is atelectatic change in the left mid lung. No edema or consolidation. Heart size and pulmonary vascularity are normal. No adenopathy. There is aortic atherosclerosis. The apparent right ninth rib fracture seen on rib  series not convincingly seen on chest radiographic examination. IMPRESSION: No evident pneumothorax. Atelectatic change left mid lung. No consolidation. Stable cardiac silhouette. Aortic Atherosclerosis (ICD10-I70.0). Electronically Signed   By: Lowella Grip III M.D.   On: 02/08/2021 07:46   DG Ribs Unilateral W/Chest Right  Result Date: 02/07/2021 CLINICAL DATA:  Fall with right chest pain EXAM: RIGHT RIBS AND CHEST - 3+ VIEW COMPARISON:  01/25/2018 FINDINGS: Single-view chest demonstrates no focal opacity or pleural effusion. Normal cardiomediastinal silhouette with aortic atherosclerosis. No pneumothorax. Right rib series demonstrates probable acute right ninth anterolateral rib fracture. IMPRESSION: 1. Negative for pneumothorax 2. Suspected acute right ninth anterolateral rib fracture Electronically Signed   By: Donavan Foil M.D.   On: 02/07/2021 19:05   CT Head Wo Contrast  Result Date: 02/08/2021 CLINICAL DATA:  Subdural hematoma, 6 hour follow-up EXAM: CT HEAD WITHOUT CONTRAST TECHNIQUE: Contiguous axial images were obtained from the base of the skull through the vertex without intravenous contrast. COMPARISON:  Yesterday FINDINGS: Brain: Known subdural hematoma along the left cerebral convexity measuring up to 5 mm in thickness at the mid sylvian fissure. No evidence of progression or new hemorrhage. No incidental infarct, swelling, or hydrocephalus. Vascular: No hyperdense vessel or unexpected calcification. Skull: Scalp staples right and posteriorly.  No calvarial fracture. Sinuses/Orbits: No acute finding. IMPRESSION: No interval progression of the left subdural hematoma which measures up to 5 mm in thickness. Electronically Signed   By: Monte Fantasia M.D.   On: 02/08/2021 04:13   CT Head Wo Contrast  Result Date: 02/07/2021 CLINICAL DATA:  [address] [address] and hit head with laceration to right side EXAM: CT HEAD WITHOUT CONTRAST CT CERVICAL SPINE WITHOUT CONTRAST TECHNIQUE: Multidetector CT imaging  of the head and cervical spine was performed following the standard protocol without intravenous contrast. Multiplanar CT image reconstructions of the cervical spine were also generated. COMPARISON:  CT 03/24/2016 FINDINGS: CT HEAD FINDINGS Brain: No acute territorial infarction or intracranial mass is visualized. Acute thin left convexity subdural hematoma, thickest overlying the temporoparietal region, hematoma measures up to 5 mm thickness. No measurable midline shift. The ventricles are nonenlarged. Vascular: No hyperdense vessels.  Carotid vascular calcification. Skull: No depressed skull fracture. Sinuses/Orbits: Fluid level left maxillary sinus Other: Small  right posterior parietal scalp laceration and hematoma CT CERVICAL SPINE FINDINGS Alignment: Mild reversal of cervical lordosis. No subluxation. Facet alignment is within normal limits. Skull base and vertebrae: No acute fracture. No primary bone lesion or focal pathologic process. Soft tissues and spinal canal: No prevertebral fluid or swelling. No visible canal hematoma. Disc levels: Moderate disc space narrowing and degenerative change C4-C5, C5-C6 and C6-C7. Facet degenerative changes at multiple levels with foraminal stenosis, greatest at C5-C6. Upper chest: Negative.  Right upper rib anomalies. Other: None IMPRESSION: 1. Acute left convexity subdural hematoma measuring up to 5 mm thickness. No midline shift. Small right posterior scalp hematoma and laceration without underlying skull fracture. 2. Mild reversal of cervical lordosis. No acute osseous abnormality. Multilevel degenerative changes Critical Value/emergent results were called by telephone at the time of interpretation on 02/07/2021 at 7:04 pm to provider [facility] , who verbally acknowledged these results. Electronically Signed   By: Donavan Foil M.D.   On: 02/07/2021 19:04   CT Chest Wo Contrast  Result Date: 02/07/2021 CLINICAL DATA:  Fall with right-sided chest pain, rib  fracture on chest x-ray EXAM: CT CHEST WITHOUT CONTRAST TECHNIQUE: Multidetector CT imaging of the chest was performed following the standard protocol without IV contrast. COMPARISON:  02/07/2021, 06/22/2020 FINDINGS: Examination was performed without intravenous contrast, limiting evaluation of the mediastinal and vascular structures in the setting of trauma. Cardiovascular: Unenhanced imaging of the heart and great vessels demonstrates no pericardial effusion. Normal caliber of the thoracic aorta. Extensive atherosclerosis of the aorta and coronary vasculature. Mediastinum/Nodes: No enlarged mediastinal or axillary lymph nodes. Thyroid gland, trachea, and esophagus demonstrate no significant findings. Lungs/Pleura: There is a trace anterior right pneumothorax at the anterior costophrenic angle, volume estimated far less than 1%. Hypoventilatory changes and scarring are seen at the lung bases. No airspace disease or effusion. Mild upper lobe emphysema. The central airways are patent. Upper Abdomen: No acute abnormality. Musculoskeletal: There is a minimally displaced right posterolateral seventh rib fracture. No other acute bony abnormalities. Reconstructed images demonstrate no additional findings. IMPRESSION: 1. Trace anterior right basilar pneumothorax, volume estimated far less than 1%. 2. Minimally displaced right posterolateral seventh rib fracture. 3. Aortic Atherosclerosis (ICD10-I70.0) and Emphysema (ICD10-J43.9). Critical Value/emergent results were called by telephone at the time of interpretation on 02/07/2021 at 8:30 pm to provider [facility] , who verbally acknowledged these results. Electronically Signed   By: Randa Ngo M.D.   On: 02/07/2021 20:36   CT CERVICAL SPINE WO CONTRAST  Result Date: 02/07/2021 CLINICAL DATA:  [address] and hit head with laceration to right side EXAM: CT HEAD WITHOUT CONTRAST CT CERVICAL SPINE WITHOUT CONTRAST TECHNIQUE: Multidetector CT imaging of the head and  cervical spine was performed following the standard protocol without intravenous contrast. Multiplanar CT image reconstructions of the cervical spine were also generated. COMPARISON:  CT 03/24/2016 FINDINGS: CT HEAD FINDINGS Brain: No acute territorial infarction or intracranial mass is visualized. Acute thin left convexity subdural hematoma, thickest overlying the temporoparietal region, hematoma measures up to 5 mm thickness. No measurable midline shift. The ventricles are nonenlarged. Vascular: No hyperdense vessels.  Carotid vascular calcification. Skull: No depressed skull fracture. Sinuses/Orbits: Fluid level left maxillary sinus Other: Small right posterior parietal scalp laceration and hematoma CT CERVICAL SPINE FINDINGS Alignment: Mild reversal of cervical lordosis. No subluxation. Facet alignment is within normal limits. Skull base and vertebrae: No acute fracture. No primary bone lesion or focal pathologic process. Soft tissues and spinal canal: No prevertebral fluid or  swelling. No visible canal hematoma. Disc levels: Moderate disc space narrowing and degenerative change C4-C5, C5-C6 and C6-C7. Facet degenerative changes at multiple levels with foraminal stenosis, greatest at C5-C6. Upper chest: Negative.  Right upper rib anomalies. Other: None IMPRESSION: 1. Acute left convexity subdural hematoma measuring up to 5 mm thickness. No midline shift. Small right posterior scalp hematoma and laceration without underlying skull fracture. 2. Mild reversal of cervical lordosis. No acute osseous abnormality. Multilevel degenerative changes Critical Value/emergent results were called by telephone at the time of interpretation on 02/07/2021 at 7:04 pm to provider [facility] , who verbally acknowledged these results. Electronically Signed   By: Jasmine Pang M.D.   On: 02/07/2021 19:04     LOS: 1 day   Lanae Boast, MD [facility]  02/09/2021, 2:17 PM

## 2021-02-09 NOTE — Evaluation (Signed)
Physical Therapy Evaluation Patient Details Name: Belinda Garcia MRN: [id number] DOB: [birth year] Today's Date: 02/09/2021   History of Present Illness  Pt is a 70 y/o F admitted with c/c of fall & hit her head with short LOC. Pt found to have minimally displaced R posterior lateral 7th rib fx, pneumothorax of less than 1%, & acute L convexity SDH measuring up to 5 mm thickness with no midline shift. PMH: HTN, dyslipidemia, COPD on home O2, GERD, fibromyalgia, DM2, anxiety, OA, hypothyroidism, hx of PE  Clinical Impression  Pt seen for PT evaluation with son's present. 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 Pt initially uses quad cane for gait in room but then notes increased rib pain so provided pt with RW. Pt is able to ambulate increased distances with RW & CGA fade to supervision with decreased gait speed. Pt reports she has a RW at home she can use. Pt is primarily limited by R rib pain & PT reviewed use of pillow to provide pressure/comfort PRN. Will continue to follow pt acutely to address pain management, activity tolerance, & increasing independence with mobility. Recommending supervision for OOB/mobility upon d/c.     Follow Up Recommendations Home health PT;Supervision for mobility/OOB    Equipment Recommendations  None recommended by PT (pt already has RW)    Recommendations for Other Services       Precautions / Restrictions Precautions Precautions: Fall Restrictions Weight Bearing Restrictions: No      Mobility  Bed Mobility Overal bed mobility: Needs Assistance Bed Mobility: Supine to Sit     Supine to sit: Min assist;HOB elevated     General bed mobility comments: not observed as pt received & left sitting in recliner    Transfers Overall transfer level: Needs assistance Equipment used: Quad cane Transfers: Sit to/from  Stand Sit to Stand: Supervision Stand pivot transfers: Min guard;Min assist       General transfer comment: extra time to complete transitional movement 2/2 pain  Ambulation/Gait Ambulation/Gait assistance: Min guard;Supervision Gait Distance (Feet): 100 Feet Assistive device: Rolling walker (2 wheeled) Gait Pattern/deviations: Decreased step length - left;Decreased step length - right;Decreased stride length;Trunk flexed Gait velocity: decreased   General Gait Details: 1 standing rest break  Stairs            Wheelchair Mobility    Modified Rankin (Stroke Patients Only)       Balance Overall balance assessment: Needs assistance Sitting-balance support: Feet supported Sitting balance-Leahy Scale: Good     Standing balance support: Single extremity supported Standing balance-Leahy Scale: Fair                               Pertinent Vitals/Pain Pain Assessment: 0-10 Pain Score: 8  Faces Pain Scale: Hurts even more Pain Location: R ribs Pain Descriptors / Indicators: Guarding;Tender;Grimacing Pain Intervention(s): Monitored during session;Patient requesting pain meds-RN notified (educated pt on hugging pillow for increased comfort with transitional movements)    Home Living Family/patient expects to be discharged to:: Private residence Living Arrangements: Spouse/significant other;Children Available Help at Discharge: Family;Available PRN/intermittently (son lives with her & spouse but works during the day) Type of Home: House Home Access: Stairs to enter Entrance Stairs-Rails: Psychiatric nurse of Steps: 4-6 [place]: Two level;Able to live on main level with bedroom/bathroom Home Equipment:  Walker - 2 wheels;Shower seat;Cane - quad      Prior Function Level of Independence: Independent with assistive device(s)         Comments: QC primarily for fxl mobility. Pt drives and performs cooking, cleaning, and all IADLs  including managing her husband's medication, caregiver for husband with dementia but sons report he is physically capable of moving around just requires supervision 2/2 cognitive deficits     Hand Dominance        Extremity/Trunk Assessment   Upper Extremity Assessment Upper Extremity Assessment: RUE deficits/detail;LUE deficits/detail RUE Deficits / Details: shld flexion tolerance limited at this time d/t R side pain LUE Deficits / Details: [facility]    Lower Extremity Assessment Lower Extremity Assessment: Generalized weakness RLE Deficits / Details: ROM limited at baseline, pt with 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        Communication   Communication: No difficulties  Cognition Arousal/Alertness: Awake/alert Behavior During Therapy: WFL for tasks assessed/performed Overall Cognitive Status: Within Functional Limits for tasks assessed                                        General Comments General comments (skin integrity, edema, etc.): Pt received on room air, SpO2 90-89% so placed on 2L/min via nasal cannula & SpO2 >90% throughout remainder of session    Exercises    Assessment/Plan    PT Assessment Patient needs continued PT services  PT Problem List Decreased strength;Decreased mobility;Decreased activity tolerance;Cardiopulmonary status limiting activity;Decreased balance;Pain       PT Treatment Interventions Therapeutic activities;DME instruction;Modalities;Gait training;Therapeutic exercise;Patient/family education;Stair training;Balance training;Functional mobility training;Neuromuscular re-education;Manual techniques    PT Goals (Current goals can be found in the Care Plan section)  Acute Rehab PT Goals Patient Stated Goal: to get stronger and be able to go home PT Goal Formulation: With patient Time For Goal Achievement: 02/23/21 Potential to Achieve Goals: Good    Frequency Min 2X/week   Barriers to discharge Decreased caregiver support       Co-evaluation               AM-PAC PT "6 Clicks" Mobility  Outcome Measure Help needed turning from your back to your side while in a flat bed without using bedrails?: A Little Help needed moving from lying on your back to sitting on the side of a flat bed without using bedrails?: A Little Help needed moving to and from a bed to a chair (including a wheelchair)?: A Little Help needed standing up from a chair using your arms (e.g., wheelchair or bedside chair)?: A Little Help needed to walk in hospital room?: A Little Help needed climbing 3-5 steps with a railing? : A Little 6 Click Score: 18    End of Session Equipment Utilized During Treatment: Oxygen;Gait belt Activity Tolerance: Patient limited by pain Patient left: in chair;with call bell/phone within reach;with family/visitor present Nurse Communication:  (O2) PT Visit Diagnosis: Muscle weakness (generalized) (M62.81);Unsteadiness on feet (R26.81);Difficulty in walking, not elsewhere classified (R26.2);Pain Pain - Right/Left: Right Pain - part of body:  (rib)    Time: 1610-9604 PT Time Calculation (min) (ACUTE ONLY): 21 min   Charges:   PT Evaluation $PT Eval Low Complexity: [place], PT, DPT 02/09/21, 2:51 PM   Waunita Schooner 02/09/2021, 2:49 PM

## 2021-02-10 DIAGNOSIS — S2231XA Fracture of one rib, right side, initial encounter for closed fracture: Secondary | ICD-10-CM | POA: Diagnosis not present

## 2021-02-10 LAB — GLUCOSE, CAPILLARY
Glucose-Capillary: 152 mg/dL — ABNORMAL HIGH (ref 70–99)
Glucose-Capillary: 213 mg/dL — ABNORMAL HIGH (ref 70–99)
Glucose-Capillary: 161 mg/dL — ABNORMAL HIGH (ref 70–99)
Glucose-Capillary: 192 mg/dL — ABNORMAL HIGH (ref 70–99)

## 2021-02-10 MED ORDER — LIDOCAINE 5 % EX PTCH
1.0000 | MEDICATED_PATCH | CUTANEOUS | Status: DC
  Administered 2021-02-10 – 2021-02-14 (×5): 1 via TRANSDERMAL
  Filled 2021-02-10 (×5): qty 1

## 2021-02-10 MED ORDER — DOCUSATE SODIUM 100 MG PO CAPS: 100.0000 mg | ORAL_CAPSULE | Freq: Two times a day (BID) | ORAL | 0 refills | Status: AC

## 2021-02-10 MED ORDER — SENNOSIDES-DOCUSATE SODIUM 8.6-50 MG PO TABS
1.0000 | ORAL_TABLET | Freq: Every day | ORAL | Status: DC
  Administered 2021-02-10 – 2021-02-14 (×5): 1 via ORAL
  Filled 2021-02-10 (×5): qty 1

## 2021-02-10 NOTE — Progress Notes (Addendum)
Physical Therapy Treatment Patient Details Name: Belinda Garcia MRN: [id number] DOB: 1951-01-15 Today's Date: 02/10/2021    History of Present Illness Pt is a 70 y/o F admitted with c/c of fall & hit her head with short LOC. Pt found to have minimally displaced R posterior lateral 7th rib fx, pneumothorax of less than 1%, & acute L convexity SDH measuring up to 5 mm thickness with no midline shift. PMH: HTN, dyslipidemia, COPD on home O2, GERD, fibromyalgia, DM2, anxiety, OA, hypothyroidism, hx of PE    PT Comments    Pt ready for session.  Son in room.  To EOB with HOB raised and increased time due to pain.  Steady once sitting.  She is able to stand with min guard with cues for hand placement.  Slow steady gait to door before stopping.  When asked she stated she was dizzy.  Further questioning she reports 8/10 dizziness.  Son brought chair and she was assisted to sitting.  BP 123/65.  She stood for orthostatic BP's 111/52  Pt asked to walk further. About 58' with progressively decreasing gait quality and needed cues to step into walker she was asked to sit.  Pt with slower response to questioning "I don't know."  She was wheeled back to room in recliner and remained up for lunch with son in room.  Discussed with RN.  Will message MD.  Pandora Leiter feels it is medication related "I've never seen her on so much." Pt did state she got dizzy which lead to her fall at home.    HHPT initially recommended at eval yesterday.  Pt did quite poorly today.  She may need to be downgraded to SNF if symptoms and mobility do not improve.  Seems to have poor awareness/judgement over dizziness and would have continued to walk if not stopped by staff.  Stated at home she sits down when dizzy.  Will plan on full orthostatics during tomorrows session.     Follow Up Recommendations  Home health PT;Supervision for mobility/OOB  May need SNF     Equipment Recommendations  None recommended by PT (pt already has RW)     Recommendations for Other Services       Precautions / Restrictions Precautions Precautions: Fall Restrictions Weight Bearing Restrictions: No    Mobility  Bed Mobility Overal bed mobility: Needs Assistance Bed Mobility: Supine to Sit     Supine to sit: Min assist;HOB elevated     General bed mobility comments: increased time and cues    Transfers Overall transfer level: Needs assistance Equipment used: Rolling walker (2 wheeled) Transfers: Sit to/from Stand   Stand pivot transfers: Min guard       General transfer comment: extra time to complete transitional movement 2/2 pain  Ambulation/Gait Ambulation/Gait assistance: Min guard Gait Distance (Feet): 30 Feet Assistive device: Rolling walker (2 wheeled)   Gait velocity: decreased   General Gait Details: 30' x 2 limited by dizziness   Stairs             Wheelchair Mobility    Modified Rankin (Stroke Patients Only)       Balance Overall balance assessment: Needs assistance Sitting-balance support: Feet supported Sitting balance-Leahy Scale: Good     Standing balance support: Bilateral upper extremity supported Standing balance-Leahy Scale: Fair                              Cognition Arousal/Alertness: Awake/alert Behavior During  Therapy: WFL for tasks assessed/performed Overall Cognitive Status: Within Functional Limits for tasks assessed                                        Exercises      General Comments        Pertinent Vitals/Pain Pain Assessment: Faces Faces Pain Scale: Hurts whole lot Pain Location: R ribs Pain Descriptors / Indicators: Guarding;Tender;Grimacing Pain Intervention(s): Monitored during session;Repositioned;Limited activity within patient's tolerance    Home Living                      Prior Function            PT Goals (current goals can now be found in the care plan section) Progress towards PT goals:  Progressing toward goals    Frequency    Min 2X/week      PT Plan      Co-evaluation              AM-PAC PT "6 Clicks" Mobility   Outcome Measure  Help needed turning from your back to your side while in a flat bed without using bedrails?: A Little Help needed moving from lying on your back to sitting on the side of a flat bed without using bedrails?: A Little Help needed moving to and from a bed to a chair (including a wheelchair)?: A Little Help needed standing up from a chair using your arms (e.g., wheelchair or bedside chair)?: A Little Help needed to walk in hospital room?: A Little Help needed climbing 3-5 steps with a railing? : A Little 6 Click Score: 18    End of Session Equipment Utilized During Treatment: Oxygen;Gait belt Activity Tolerance: Patient limited by pain Patient left: in chair;with call bell/phone within reach;with family/visitor present Nurse Communication:  (O2) PT Visit Diagnosis: Muscle weakness (generalized) (M62.81);Unsteadiness on feet (R26.81);Difficulty in walking, not elsewhere classified (R26.2);Pain Pain - Right/Left: Right Pain - part of body:  (rib)     Time: 8366-2947 PT Time Calculation (min) (ACUTE ONLY): 28 min  Charges:  $Gait Training: 23-37 mins                    Chesley Noon, PTA 02/10/21, 2:07 PM

## 2021-02-10 NOTE — Progress Notes (Signed)
PROGRESS NOTE    Belinda Garcia  [id number] DOB: 1951-04-29 DOA: 02/07/2021 PCP: Maryland Pink, MD   Chief Complaint  Patient presents with  . Fall  . 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 In the ED,routine labs are stable except for hypokalemia hyponatremia,leukocytosis COVID-19 negative. CXR-no acute findings CT scan of the chest showed trace anterior right basilar pneumothorax less than 1%,minimally displaced right posterior lateral seventh rib fracture.  CT head with acute left convexity subdural hematoma measuring up to 5 mm thickness no midline shift right posterior scalp hematoma laceration.Neurosurgery was consulted CT scan was repeated 6 hours later with no interval progression of the left subdural hematoma measuring 5 mm. Patient continued of ongoing right-sided chest pain and surgery was consulted and patient was admitted for pain management.  Subjective: Seen this morning with complaint of chest pain  No other new complaints No dizziness lightheadedness or headache.  Assessment & Plan:  Traumatic subdural hematoma-stable per 6 hr repeat CT head Right scalp hematoma s/p staples. Seen by neurosurgery Dr. Cari Caraway and he is planning for 1 month follow-up with repeat CT head.  No further monitoring advised patient regarding Neurological deficit or complaint . Cont keppra x 1 wk- Rx already sent by EDP.Right scalp staple in place will need follow-up in the ED to remove a staple. I have discontnued patient's Excedrin, aspirin, Mobic and advised not to take any NSAIDs/blood thinners for now.   Trace anterior right basilar pneumothorax less than 1% volume loss Minimally  displaced right posterior lateral seventh rib fracture Seen by surgery no acute intervention needed for small pneumothorax.  Continue pain control for her chest wall pain is still having lots of pain, continue oxycodone as needed, IV morphine, will add lidocaine patch already has 1 on the right anterior chest can use 1 at the site of right posterior chest wall. Encourage incentive spirometry.   COPD/chronic hypoxic respiratory failure on 2 L of cannula, continue same continue bronchodilators, IS.  Leukocytosis likely reactive has improved .  Patient is afebrile.  Chronic back pain managed with oral oxycodone at home.  She is followed by neurosurgery as outpatient. Dyslipidemia on statin Anxiety/depression continue home Wellbutrin and Valium GERD continue PPI Mild hypokalemia/hyponatremia- stable T2DM with peripheral neuropathy:  She will continue her home regimen of glipizide metformin and Neurontin. Essential hypertension: blood pressure soft today.  I will stop amlodipine and losartan.  Continue  HCTZ, metoprolol and her Imdur.   Morbid obesity with BMI 31 will benefit weight loss.  Fall at home with physical deconditioning dizziness/chronic right knee pain/chronic low back pain: PT OT advised mobility with supervision but she lives with her demented husband that she takes care of him, she has a son living with her but he goes to work at 6 AM comes back around 5 PM.  Continue to work on pain management PT OT , she may need a skilled nursing facility continue to monitor her progress   Diet Order            Diet - low sodium heart healthy           Diet Carb Modified Fluid consistency: Thin; Room service appropriate? Yes  Diet effective now  Nutrition Problem: Increased nutrient needs Etiology: catabolic illness (COPD) Signs/Symptoms: estimated needs   Patient's Body mass index is 31.62 kg/m.  DVT prophylaxis: Place and maintain sequential compression device Start:  02/08/21 0612 Code Status:   Code Status: Full Code  Family Communication: plan of care discussed with patient at bedside.  Status is: Inpatient Remains inpatient appropriate because:Inpatient level of care appropriate due to severity of illness  Dispo: The patient is from: Home              Anticipated d/c is to: SNF vs HHC              Patient currently is medically stable to d/c.   Difficult to place patient No  Unresulted Labs (From admission, onward)          Start     Ordered   02/08/21 0453  CBC WITH DIFFERENTIAL  Once,   STAT        02/08/21 0456          Medications reviewed:  Scheduled Meds: . amLODipine  5 mg Oral BID  . atorvastatin  40 mg Oral QHS  . buPROPion  150 mg Oral q morning  . cholecalciferol  1,000 Units Oral Daily  . doxepin  10 mg Oral QHS  . DULoxetine  60 mg Oral Daily  . feeding supplement  237 mL Oral BID BM  . hydrochlorothiazide  25 mg Oral Daily  . insulin aspart  0-9 Units Subcutaneous TID AC & HS  . isosorbide mononitrate  60 mg Oral Daily  . levETIRAcetam  500 mg Oral BID  . levothyroxine  125 mcg Oral Q0600  . lidocaine  1 patch Transdermal Q24H  . lidocaine  1 patch Transdermal Q24H  . losartan  100 mg Oral Daily  . metoprolol succinate  25 mg Oral Daily  . montelukast  10 mg Oral QHS  . pantoprazole  40 mg Oral Daily  . potassium chloride  10 mEq Oral BID  . pregabalin  75 mg Oral BID   Continuous Infusions:   Consultants:see note  Procedures:see note  Antimicrobials: Anti-infectives (From admission, onward)   None     Culture/Microbiology    Component Value Date/Time   SDES URINE, RANDOM 04/02/2017 1931   SPECREQUEST NONE 04/02/2017 1931   CULT MULTIPLE SPECIES PRESENT, SUGGEST RECOLLECTION (A) 04/02/2017 1931   REPTSTATUS 04/04/2017 FINAL 04/02/2017 1931    Other culture-see note  Objective: Vitals: Today's Vitals   02/10/21 0807 02/10/21 1045 02/10/21 1130 02/10/21 1146  BP:    (!) 120/49  Pulse:    75   Resp:    (!) 22  Temp:    (!) 97.5 F (36.4 C)  TempSrc:    Oral  SpO2:    97%  Weight:      Height:      PainSc: Asleep 8  6      Intake/Output Summary (Last 24 hours) at 02/10/2021 1213 Last data filed at 02/10/2021 0900 Gross per 24 hour  Intake --  Output 1550 ml  Net -1550 ml   Filed Weights   02/07/21 1805  Weight: 86.2 kg   Weight change:   Intake/Output from previous day: 05/07 0701 - 05/08 0700 In: 240 [P.O.:240] Out: 750 [Urine:750] Intake/Output this shift: Total I/O In: -  Out: 800 [Urine:800] Filed Weights   02/07/21 1805  Weight: 86.2 kg   Examination: general exam: AAOx 3, obese, older than stated age, weak appearing. HEENT:Oral mucosa moist, Ear/Nose  WNL grossly, dentition normal. Respiratory system: bilaterally diminished at base, bruise on the right chest wall posteriorly and also has tenderness there, no use of accessory muscle Cardiovascular system: S1 & S2 +, No JVD,. Gastrointestinal system: Abdomen soft,obese NT,ND, BS+ Nervous System:Alert, awake, moving extremities and grossly nonfocal Extremities: no edema, distal peripheral pulses palpable.  Skin: No rashes,no icterus. MSK: Normal muscle bulk,tone, power  Data Reviewed: I have personally reviewed following labs and imaging studies CBC: Recent Labs  Lab 02/07/21 1945 02/08/21 0532  WBC 16.9* 10.9*  HGB 13.3 13.5  HCT 38.9 39.8  MCV 97.0 99.3  PLT 317 132   Basic Metabolic Panel: Recent Labs  Lab 02/07/21 1945 02/08/21 0532  NA 129* 134*  K 3.3* 4.2  CL 93* 96*  CO2 27 29  GLUCOSE 112* 130*  BUN 11 11  CREATININE 0.60 0.65  CALCIUM 9.0 8.8*   GFR: Estimated Creatinine Clearance: 73 mL/min (by C-G formula based on SCr of 0.65 mg/dL). Liver Function Tests: Recent Labs  Lab 02/07/21 1945  AST 23  ALT 23  ALKPHOS 63  BILITOT 0.9  PROT 6.7  ALBUMIN 3.9   No results for input(s): LIPASE, AMYLASE in the last 168 hours. No results for input(s): AMMONIA in the last 168  hours. Coagulation Profile: Recent Labs  Lab 02/07/21 1945  INR 1.0   Cardiac Enzymes: No results for input(s): CKTOTAL, CKMB, CKMBINDEX, TROPONINI in the last 168 hours. BNP (last 3 results) No results for input(s): PROBNP in the last 8760 hours. HbA1C: Recent Labs    02/08/21 0532  HGBA1C 6.8*   CBG: Recent Labs  Lab 02/09/21 1208 02/09/21 1557 02/09/21 2157 02/10/21 0736 02/10/21 1147  GLUCAP 205* 228* 175* 152* 213*   Lipid Profile: No results for input(s): CHOL, HDL, LDLCALC, TRIG, CHOLHDL, LDLDIRECT in the last 72 hours. Thyroid Function Tests: No results for input(s): TSH, T4TOTAL, FREET4, T3FREE, THYROIDAB in the last 72 hours. Anemia Panel: No results for input(s): VITAMINB12, FOLATE, FERRITIN, TIBC, IRON, RETICCTPCT in the last 72 hours. Sepsis Labs: No results for input(s): PROCALCITON, LATICACIDVEN in the last 168 hours.  Recent Results (from the past 240 hour(s))  Resp Panel by RT-PCR (Flu A&B, Covid) Nasopharyngeal Swab     Status: None   Collection Time: 02/07/21  7:45 PM   Specimen: Nasopharyngeal Swab; Nasopharyngeal(NP) swabs in vial transport medium  Result Value Ref Range Status   SARS Coronavirus 2 by RT PCR NEGATIVE NEGATIVE Final    Comment: (NOTE) SARS-CoV-2 target nucleic acids are NOT DETECTED.  The SARS-CoV-2 RNA is generally detectable in upper respiratory specimens during the acute phase of infection. The lowest concentration of SARS-CoV-2 viral copies this assay can detect is 138 copies/mL. A negative result does not preclude SARS-Cov-2 infection and should not be used as the sole basis for treatment or other patient management decisions. A negative result may occur with  improper specimen collection/handling, submission of specimen other than nasopharyngeal swab, presence of viral mutation(s) within the areas targeted by this assay, and inadequate number of viral copies(<138 copies/mL). A negative result must be combined  with clinical observations, patient history, and epidemiological information. The expected result is Negative.  Fact Sheet for Patients:  EntrepreneurPulse.com.au  Fact Sheet for Healthcare Providers:  IncredibleEmployment.be  This test is no t yet approved or cleared by the [place] FDA and  has been authorized for detection and/or diagnosis of SARS-CoV-2 by FDA under an Emergency Use Authorization (EUA). This EUA will remain  in  effect (meaning this test can be used) for the duration of the COVID-19 declaration under Section 564(b)(1) of the Act, 21 U.S.C.section 360bbb-3(b)(1), unless the authorization is terminated  or revoked sooner.       Influenza A by PCR NEGATIVE NEGATIVE Final   Influenza B by PCR NEGATIVE NEGATIVE Final    Comment: (NOTE) The Xpert Xpress SARS-CoV-2/FLU/RSV plus assay is intended as an aid in the diagnosis of influenza from Nasopharyngeal swab specimens and should not be used as a sole basis for treatment. Nasal washings and aspirates are unacceptable for Xpert Xpress SARS-CoV-2/FLU/RSV testing.  Fact Sheet for Patients: EntrepreneurPulse.com.au  Fact Sheet for Healthcare Providers: IncredibleEmployment.be  This test is not yet approved or cleared by the [place] FDA and has been authorized for detection and/or diagnosis of SARS-CoV-2 by FDA under an Emergency Use Authorization (EUA). This EUA will remain in effect (meaning this test can be used) for the duration of the COVID-19 declaration under Section 564(b)(1) of the Act, 21 U.S.C. section 360bbb-3(b)(1), unless the authorization is terminated or revoked.  Performed at [facility], [address], [place], [place] [address]      Radiology Studies: No results found.   LOS: 2 days   Antonieta Pert, MD [facility]  02/10/2021, 12:13 PM

## 2021-02-10 NOTE — TOC Progression Note (Signed)
Transition of Care [facility]) - Progression Note    Patient Details  Name: Belinda Garcia MRN: [id number] Date of Birth: Apr 04, 1951  Transition of Care [facility]) CM/SW [place], RN Phone Number: 02/10/2021, 12:43 PM  Clinical Narrative: Damaris Schooner with patient, Evorn Gong at bedside, discussed Home Health PT/OT service per recommendations of PT/OT. Patient and Son both agreed to services.[facility] will provide [facility] services. Patient voices no other TOC needs at this time.          Expected Discharge Plan and Services                                                 Social Determinants of Health (SDOH) Interventions    Readmission Risk Interventions No flowsheet data found.

## 2021-02-11 DIAGNOSIS — S2231XA Fracture of one rib, right side, initial encounter for closed fracture: Secondary | ICD-10-CM | POA: Diagnosis not present

## 2021-02-11 LAB — CBC
HCT: 43.1 % (ref 36.0–46.0)
Hemoglobin: 14 g/dL (ref 12.0–15.0)
MCH: 32.9 pg (ref 26.0–34.0)
MCHC: 32.5 g/dL (ref 30.0–36.0)
MCV: 101.2 fL — ABNORMAL HIGH (ref 80.0–100.0)
Platelets: 326 10*3/uL (ref 150–400)
RBC: 4.26 MIL/uL (ref 3.87–5.11)
RDW: 13.1 % (ref 11.5–15.5)
WBC: 12.5 10*3/uL — ABNORMAL HIGH (ref 4.0–10.5)
nRBC: 0 % (ref 0.0–0.2)

## 2021-02-11 LAB — BASIC METABOLIC PANEL
Anion gap: 8 (ref 5–15)
BUN: 17 mg/dL (ref 8–23)
CO2: 30 mmol/L (ref 22–32)
Calcium: 9.3 mg/dL (ref 8.9–10.3)
Chloride: 96 mmol/L — ABNORMAL LOW (ref 98–111)
Creatinine, Ser: 0.58 mg/dL (ref 0.44–1.00)
GFR, Estimated: >60 mL/min (ref >60)
Glucose, Bld: 176 mg/dL — ABNORMAL HIGH (ref 70–99)
Potassium: 4.2 mmol/L (ref 3.5–5.1)
Sodium: 134 mmol/L — ABNORMAL LOW (ref 135–145)

## 2021-02-11 LAB — GLUCOSE, CAPILLARY
Glucose-Capillary: 133 mg/dL — ABNORMAL HIGH (ref 70–99)
Glucose-Capillary: 166 mg/dL — ABNORMAL HIGH (ref 70–99)
Glucose-Capillary: 178 mg/dL — ABNORMAL HIGH (ref 70–99)
Glucose-Capillary: 174 mg/dL — ABNORMAL HIGH (ref 70–99)

## 2021-02-11 NOTE — Progress Notes (Signed)
Occupational Therapy Treatment Patient Details Name: Belinda Garcia MRN: [id number] DOB: 04-Feb-1951 Today's Date: 02/11/2021    History of present illness Pt is a 70 y/o F admitted with c/c of fall & hit her head with short LOC. Pt found to have minimally displaced R posterior lateral 7th rib fx, pneumothorax of less than 1%, & acute L convexity SDH measuring up to 5 mm thickness with no midline shift. PMH: HTN, dyslipidemia, COPD on home O2, GERD, fibromyalgia, DM2, anxiety, OA, hypothyroidism, hx of PE   OT comments  Ms. Stefanik is making good progress toward her functional goals, but remains limited by pain, limited endurance, and dizziness that impacts her safety and independence in ADLs.  Pt pleasant and motivated to participate in today's session focused on self care tasks and functional ambulation to support strengthening and safety in meaningful occupations.  OT provided education and demonstration re: use of adaptive equipment for lower body dressing (reacher and sock aid).  Pt receptive to education, stating it could be helpful moving forward given acute rib pain but also chronic lower back pain.  Pt declined to practice with adaptive equipment, but denied any further questions.  OT provided min assist for sit to stand transfer with RW and contact guard assist for ambulation with RW to nurse's station and back.  Pt required extended time with slow gait and requiring several rest breaks, but demonstrated good technique and no loss of balance.  Pt complains of 5/10 dizziness with all standing tasks, but BP remained within normal limits with all activity.  Pt's SpO2 remained in 90s on 2L [place] oxygen.  RR elevated to 30-40 with activity, but pt able to recover quickly with min cues from OT for rest breaks.  Pt will continue to benefit from skilled OT services in acute setting to address functional strengthening, balance, safety, and independence in ADLs.  Pt would likely benefit from short term rehab after  discharge given limited support/supervision at home and significant deficits compared to PLOF.  OT educated pt on discharge recommendations and pt verbalized understanding, though expressed concerns about needing to go home.  Should pt go home (rather than SNF), recommend HHOT and 3in1 bedside commode to optimize home safety.    Follow Up Recommendations  Home health OT;Supervision - Intermittent;SNF    Equipment Recommendations  3 in 1 bedside commode    Recommendations for Other Services      Precautions / Restrictions Precautions Precautions: Fall Precaution Comments: monitor blood pressure 2/2 complaints of dizziness Restrictions Weight Bearing Restrictions: No       Mobility Bed Mobility Overal bed mobility: Needs Assistance             General bed mobility comments: received in recliner    Transfers Overall transfer level: Needs assistance Equipment used: Rolling walker (2 wheeled) Transfers: Sit to/from Stand Sit to Stand: Min assist Stand pivot transfers: Min guard       General transfer comment: Extra time, min verbal cues for encouragement    Balance Overall balance assessment: Needs assistance Sitting-balance support: Feet supported Sitting balance-Leahy Scale: Good     Standing balance support: Bilateral upper extremity supported Standing balance-Leahy Scale: Fair                             ADL either performed or assessed with clinical judgement   ADL Overall ADL's : Needs assistance/impaired Eating/Feeding: Set up;Sitting   Grooming: Set up;Sitting  Upper Body Bathing: Set up;Sitting   Lower Body Bathing: Moderate assistance;Sitting/lateral leans   Upper Body Dressing : Set up;Sitting   Lower Body Dressing: Maximal assistance;Sit to/from stand Lower Body Dressing Details (indicate cue type and reason): demonstrated use of AE for lower body dressing, so anticipate need for less assist with AE Toilet Transfer: Minimal  assistance;Regular Toilet;RW             General ADL Comments: Pt able to complete seated UB ADLs with setup assist, CGA/Min A for ADL transfers and CGA for functional mobility with RW.  Anticipate less assist needed for LB dressing with use of AE as demonstrated in today's session.     Vision Patient Visual Report: No change from baseline     Perception     Praxis      Cognition Arousal/Alertness: Awake/alert Behavior During Therapy: WFL for tasks assessed/performed Overall Cognitive Status: Within Functional Limits for tasks assessed                                 General Comments: Pt pleasant and motivated        Exercises Other Exercises Other Exercises: provided education re: self care, energy conservation, functional transfers and mobility, adaptive equipment for lower body dressing   Shoulder Instructions       General Comments Pt received on room air, SpO2 90-89% so placed on 2L/min via nasal cannula & SpO2 >90% throughout remainder of session.  BP slightly lowered with sit > supine but within normal range.    Pertinent Vitals/ Pain       Pain Assessment: 0-10 Pain Score: 7  Pain Location: R ribs- denied pain medicine prior to session but requested after activity Pain Descriptors / Indicators: Guarding;Tender;Grimacing Pain Intervention(s): Limited activity within patient's tolerance;Monitored during session;Patient requesting pain meds-RN notified;Relaxation  Home Living                                          Prior Functioning/Environment              Frequency  Min 1X/week        Progress Toward Goals  OT Goals(current goals can now be found in the care plan section)  Progress towards OT goals: Progressing toward goals  Acute Rehab OT Goals Patient Stated Goal: to get stronger and be able to go home OT Goal Formulation: With patient Time For Goal Achievement: 02/23/21 Potential to Achieve Goals: Good   Plan Discharge plan needs to be updated;Frequency remains appropriate    Co-evaluation                 AM-PAC OT "6 Clicks" Daily Activity     Outcome Measure   Help from another person eating meals?: None Help from another person taking care of personal grooming?: None Help from another person toileting, which includes using toliet, bedpan, or urinal?: A Little Help from another person bathing (including washing, rinsing, drying)?: A Lot Help from another person to put on and taking off regular upper body clothing?: A Little Help from another person to put on and taking off regular lower body clothing?: A Lot 6 Click Score: 18    End of Session Equipment Utilized During Treatment: Gait belt;Rolling walker;Oxygen  OT Visit Diagnosis: Unsteadiness on feet (R26.81);Muscle weakness (generalized) (M62.81);Pain Pain - Right/Left:  Right   Activity Tolerance Patient tolerated treatment well   Patient Left in chair;with call bell/phone within reach   Nurse Communication Patient requests pain meds        Time: 6237-6283 OT Time Calculation (min): 65 min  Charges: OT General Charges $OT Visit: 1 Visit OT Treatments $Self Care/Home Management : 23-37 mins $Therapeutic Activity: 23-37 mins  Myrtie Hawk Lakeyn Dokken, OTR/L 02/11/21, 2:53 PM

## 2021-02-11 NOTE — Plan of Care (Signed)
End of Shift Summary:   Alert and oriented x4. VSS. Remained on 2L via [place] (home dose) overnight. Pain managed with prn medications. Blood glucose monitored. Urine output adequate via purewick. (-) BM. Remained free from injury or falls. Bed low and in locked position, bed alarm on, call bell within reach and encouraged to use.   Problem: Education: Goal: Knowledge of General Education information will improve Description: Including pain rating scale, medication(s)/side effects and non-pharmacologic comfort measures Outcome: Progressing   Problem: Health Behavior/Discharge Planning: Goal: Ability to manage health-related needs will improve Outcome: Progressing   Problem: Pain Managment: Goal: General experience of comfort will improve Outcome: Progressing

## 2021-02-11 NOTE — Progress Notes (Signed)
PROGRESS NOTE    Belinda Garcia  [id number] DOB: 1950/11/12 DOA: 02/07/2021 PCP: [place] Pink, MD   Chief Complaint  Patient presents with  . Fall  . 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 In the ED,routine labs are stable except for hypokalemia hyponatremia,leukocytosis COVID-19 negative. CXR-no acute findings CT scan of the chest showed trace anterior right basilar pneumothorax less than 1%,minimally displaced right posterior lateral seventh rib fracture.  CT head with acute left convexity subdural hematoma measuring up to 5 mm thickness no midline shift right posterior scalp hematoma laceration.Neurosurgery was consulted CT scan was repeated 6 hours later with no interval progression of the left subdural hematoma measuring 5 mm. Patient continued of ongoing right-sided chest pain and surgery was consulted and patient was admitted for pain management. Pain pain has been controlled on lidocaine patch oral Oxy, Tylenol.  Being followed by PT OT did not do very well and skilled nursing facility has been suggested 5/9.  Subjective:  Seen this morning on 2 L nasal cannula.  Having her meal.  Reports her pain is controlled.  No dizziness left-sided headache focal weakness numbness tingling.  Has pain at the staple site on rt scalp. No fever or chills.  Assessment & Plan:  Traumatic subdural hematoma Right scalp hematoma s/p staples. Seen by neurosurgery Dr. Cari Caraway, CT head stable x2.He is planning for 1 month follow-up with repeat CT head and okay for discharge. No new Neurological deficit or complaint . Cont keppra x 1 wk- Rx already sent by EDP.Right scalp staple in place will need  follow-up in the ED to remove a staple. I have discontnued patient's Excedrin, aspirin, Mobic and advised not to take any NSAIDs/blood thinners for now, no heparin/lovenox.   Trace anterior right basilar pneumothorax less than 1% volume loss Minimally displaced right posterior lateral seventh rib fracture Seen by surgery no acute intervention needed for small pneumothorax.  Continue pain control for her chest wall pain -encourage incentive spirometry.  Continue on [place] p.o., IV morphine, Tylenol and lidocaine patch.  Pain is well controlled.    COPD/chronic hypoxic respiratory failure on 2 L of cannula, continue same continue bronchodilators, IS.  Leukocytosis likely reactive, high on admission and down trended now a in12k. Afebrile.  Encouraged use of incentive spirometry explained the risk of pneumonia. Recent Labs  Lab 02/07/21 1945 02/08/21 0532 02/11/21 0815  WBC 16.9* 10.9* 12.5*   Chronic back pain managed with oral oxycodone at home.  She is followed by neurosurgery as outpatient. Dyslipidemia continue statin Anxiety/depression continue home Wellbutrin and Valium.  Mood is stable GERD continue PPI Mild hypokalemia/hyponatremia- stable T2DM with peripheral neuropathy:  Blood sugar well controlled on sliding scale insulin, resume glipizide metformin on d.c cont Neurontin. Recent Labs  Lab 02/10/21 0736 02/10/21 1147 02/10/21 1543 02/10/21 2129 02/11/21 0800  GLUCAP 152* 213* 161* 192* 133*   Essential hypertension:  Blood pressure has been soft so discontinued amlodipine and losartan.  Okay today so we will keep on her home HCTZ metoprolol and Imdur.    Morbid obesity with BMI 31 will benefit weight loss.  Fall at home with physical deconditioning dizziness/chronic right knee pain/chronic low back pain: Negative for orthostatic with physical therapy today.  Less dizzy.  Has had chronic dizziness  issue continue her home meclizine.  PT advised SNF today. She lives with her  demented husband that she takes care of him, she has a son living with her but he goes to work at 6 AM comes back around 5 PM. Will benefit with a skilled nursing facility placement, TOC looking for bed.   Diet Order            Diet - low sodium heart healthy           Diet Carb Modified Fluid consistency: Thin; Room service appropriate? Yes  Diet effective now                 Nutrition Problem: Increased nutrient needs Etiology: catabolic illness (COPD) Signs/Symptoms: estimated needs   Patient's Body mass index is 31.62 kg/m.  DVT prophylaxis: Place and maintain sequential compression device Start: 02/10/21 1248 Place and maintain sequential compression device Start: 02/08/21 0612 Code Status:   Code Status: Full Code  Family Communication: plan of care discussed with patient at bedside.  Status is: Inpatient Remains inpatient appropriate because:Inpatient level of care appropriate due to severity of illness  Dispo: The patient is from: Home              Anticipated d/c is to: SNF              Patient currently is medically stable for discharge   Difficult to place patient No  Unresulted Labs (From admission, onward)         None      Medications reviewed:  Scheduled Meds: . atorvastatin  40 mg Oral QHS  . buPROPion  150 mg Oral q morning  . cholecalciferol  1,000 Units Oral Daily  . doxepin  10 mg Oral QHS  . DULoxetine  60 mg Oral Daily  . feeding supplement  237 mL Oral BID BM  . hydrochlorothiazide  25 mg Oral Daily  . insulin aspart  0-9 Units Subcutaneous TID AC & HS  . isosorbide mononitrate  60 mg Oral Daily  . levETIRAcetam  500 mg Oral BID  . levothyroxine  125 mcg Oral Q0600  . lidocaine  1 patch Transdermal Q24H  . lidocaine  1 patch Transdermal Q24H  . metoprolol succinate  25 mg Oral Daily  . montelukast  10 mg Oral QHS  . pantoprazole  40 mg Oral Daily  . potassium chloride  10 mEq Oral BID  . pregabalin  75 mg Oral BID  . senna-docusate  1  tablet Oral Q0600   Continuous Infusions:   Consultants:see note  Procedures:see note  Antimicrobials: Anti-infectives (From admission, onward)   None     Culture/Microbiology    Component Value Date/Time   SDES URINE, RANDOM 04/02/2017 1931   SPECREQUEST NONE 04/02/2017 1931   CULT MULTIPLE SPECIES PRESENT, SUGGEST RECOLLECTION (A) 04/02/2017 1931   REPTSTATUS 04/04/2017 FINAL 04/02/2017 1931    Other culture-see note  Objective: Vitals: Today's Vitals   02/10/21 2209 02/11/21 0330 02/11/21 0802 02/11/21 1000  BP:  139/65 (!) 150/57   Pulse:  80 84   Resp:  11 15   Temp:  97.8 F (36.6 C) 98.2 F (36.8 C)   TempSrc:  Oral Oral   SpO2:  96% 90%   Weight:      Height:      PainSc: Asleep 0-No pain 0-No pain 2     Intake/Output Summary (Last 24 hours) at 02/11/2021 1110 Last data filed at 02/11/2021 1051 Gross per 24  hour  Intake 237 ml  Output 0 ml  Net 237 ml   Filed Weights   02/07/21 1805  Weight: 86.2 kg   Weight change:   Intake/Output from previous day: 05/08 0701 - 05/09 0700 In: 237 [P.O.:237] Out: 800 [Urine:800] Intake/Output this shift: No intake/output data recorded. Filed Weights   02/07/21 1805  Weight: 86.2 kg   Examination: General exam: AAOx 3, o nNC,  older than stated age, weak appearing. HEENT:Oral mucosa moist, Ear/Nose WNL grossly, dentition normal. Respiratory system: bilaterally diminished at the base, no, no use of accessory muscle Cardiovascular system: S1 & S2 +, No JVD,. Gastrointestinal system: Abdomen soft,NT,ND, BS+ Nervous System:Alert, awake, moving extremities and grossly nonfocal Extremities: No edema, distal peripheral pulses palpable.  Skin: No rashes,no icterus.  Bruise on the right lateral chest wall. MSK: Normal muscle bulk,tone, power  Data Reviewed: I have personally reviewed following labs and imaging studies CBC: Recent Labs  Lab 02/07/21 1945 02/08/21 0532 02/11/21 0815  WBC 16.9* 10.9* 12.5*  HGB  13.3 13.5 14.0  HCT 38.9 39.8 43.1  MCV 97.0 99.3 101.2*  PLT 317 310 093   Basic Metabolic Panel: Recent Labs  Lab 02/07/21 1945 02/08/21 0532 02/11/21 0815  NA 129* 134* 134*  K 3.3* 4.2 4.2  CL 93* 96* 96*  CO2 27 29 30   GLUCOSE 112* 130* 176*  BUN 11 11 17   CREATININE 0.60 0.65 0.58  CALCIUM 9.0 8.8* 9.3   GFR: Estimated Creatinine Clearance: 73 mL/min (by C-G formula based on SCr of 0.58 mg/dL). Liver Function Tests: Recent Labs  Lab 02/07/21 1945  AST 23  ALT 23  ALKPHOS 63  BILITOT 0.9  PROT 6.7  ALBUMIN 3.9   No results for input(s): LIPASE, AMYLASE in the last 168 hours. No results for input(s): AMMONIA in the last 168 hours. Coagulation Profile: Recent Labs  Lab 02/07/21 1945  INR 1.0   Cardiac Enzymes: No results for input(s): CKTOTAL, CKMB, CKMBINDEX, TROPONINI in the last 168 hours. BNP (last 3 results) No results for input(s): PROBNP in the last 8760 hours. HbA1C: No results for input(s): HGBA1C in the last 72 hours. CBG: Recent Labs  Lab 02/10/21 0736 02/10/21 1147 02/10/21 1543 02/10/21 2129 02/11/21 0800  GLUCAP 152* 213* 161* 192* 133*   Lipid Profile: No results for input(s): CHOL, HDL, LDLCALC, TRIG, CHOLHDL, LDLDIRECT in the last 72 hours. Thyroid Function Tests: No results for input(s): TSH, T4TOTAL, FREET4, T3FREE, THYROIDAB in the last 72 hours. Anemia Panel: No results for input(s): VITAMINB12, FOLATE, FERRITIN, TIBC, IRON, RETICCTPCT in the last 72 hours. Sepsis Labs: No results for input(s): PROCALCITON, LATICACIDVEN in the last 168 hours.  Recent Results (from the past 240 hour(s))  Resp Panel by RT-PCR (Flu A&B, Covid) Nasopharyngeal Swab     Status: None   Collection Time: 02/07/21  7:45 PM   Specimen: Nasopharyngeal Swab; Nasopharyngeal(NP) swabs in vial transport medium  Result Value Ref Range Status   SARS Coronavirus 2 by RT PCR NEGATIVE NEGATIVE Final    Comment: (NOTE) SARS-CoV-2 target nucleic acids are NOT  DETECTED.  The SARS-CoV-2 RNA is generally detectable in upper respiratory specimens during the acute phase of infection. The lowest concentration of SARS-CoV-2 viral copies this assay can detect is 138 copies/mL. A negative result does not preclude SARS-Cov-2 infection and should not be used as the sole basis for treatment or other patient management decisions. A negative result may occur with  improper specimen collection/handling, submission of specimen other  than nasopharyngeal swab, presence of viral mutation(s) within the areas targeted by this assay, and inadequate number of viral copies(<138 copies/mL). A negative result must be combined with clinical observations, patient history, and epidemiological information. The expected result is Negative.  Fact Sheet for Patients:  EntrepreneurPulse.com.au  Fact Sheet for Healthcare Providers:  IncredibleEmployment.be  This test is no t yet approved or cleared by the [place] FDA and  has been authorized for detection and/or diagnosis of SARS-CoV-2 by FDA under an Emergency Use Authorization (EUA). This EUA will remain  in effect (meaning this test can be used) for the duration of the COVID-19 declaration under Section 564(b)(1) of the Act, 21 U.S.C.section 360bbb-3(b)(1), unless the authorization is terminated  or revoked sooner.       Influenza A by PCR NEGATIVE NEGATIVE Final   Influenza B by PCR NEGATIVE NEGATIVE Final    Comment: (NOTE) The Xpert Xpress SARS-CoV-2/FLU/RSV plus assay is intended as an aid in the diagnosis of influenza from Nasopharyngeal swab specimens and should not be used as a sole basis for treatment. Nasal washings and aspirates are unacceptable for Xpert Xpress SARS-CoV-2/FLU/RSV testing.  Fact Sheet for Patients: EntrepreneurPulse.com.au  Fact Sheet for Healthcare Providers: IncredibleEmployment.be  This test is not yet  approved or cleared by the [place] FDA and has been authorized for detection and/or diagnosis of SARS-CoV-2 by FDA under an Emergency Use Authorization (EUA). This EUA will remain in effect (meaning this test can be used) for the duration of the COVID-19 declaration under Section 564(b)(1) of the Act, 21 U.S.C. section 360bbb-3(b)(1), unless the authorization is terminated or revoked.  Performed at [facility], [address], [place], [place] [address]      Radiology Studies: No results found.   LOS: 3 days   Antonieta Pert, MD [facility]  02/11/2021, 11:10 AM

## 2021-02-11 NOTE — Progress Notes (Signed)
Physical Therapy Treatment Patient Details Name: Belinda Garcia MRN: [id number] DOB: 05-20-1951 Today's Date: 02/11/2021    History of Present Illness Pt is a 70 y/o F admitted with c/c of fall & hit her head with short LOC. Pt found to have minimally displaced R posterior lateral 7th rib fx, pneumothorax of less than 1%, & acute L convexity SDH measuring up to 5 mm thickness with no midline shift. PMH: HTN, dyslipidemia, COPD on home O2, GERD, fibromyalgia, DM2, anxiety, OA, hypothyroidism, hx of PE    PT Comments    Pt just up to chair with nursing students upon arrival.  Orthostatic BP's taken and multiple times during gait.  BP's generally stable today but she continues to report 5/10 dizziness at times with gait and standing.  She is able to progress gait 30' and 73' with slow gait limited by pain and multiple cues for walker position and to step up into walker box.    While pt did better today, she remains quite limited with mobility and dizziness remains a factor.  Discussed with pt home DC plan and she will be most of the day without help.  Husband is home with dementia and cannot provide physical assist or needed cues necessary for same mobility and to monitor symptoms.  She does state she wishes to return home but is aware she will struggle.  Discussed SNF and she is open to bed search in case she does not improve enough to discharge safely home when medically necessary.  Will discuss with team.     Follow Up Recommendations  SNF     Equipment Recommendations  Rolling walker with 5" wheels (pt already has RW)    Recommendations for Other Services       Precautions / Restrictions Precautions Precautions: Fall Precaution Comments: dizziness    Mobility  Bed Mobility Overal bed mobility: Needs Assistance             General bed mobility comments: in recliner upon arrival but nursing assisted with transitions    Transfers Overall transfer level: Needs  assistance Equipment used: Rolling walker (2 wheeled) Transfers: Sit to/from Stand   Stand pivot transfers: Min assist       General transfer comment: extra time and cues for hand placements  Ambulation/Gait Ambulation/Gait assistance: Min assist Gait Distance (Feet): 50 Feet Assistive device: Rolling walker (2 wheeled) Gait Pattern/deviations: Decreased step length - left;Decreased step length - right;Decreased stride length;Trunk flexed Gait velocity: decreased   General Gait Details: 35' and 50' limited by dizziness and fatigue   Stairs             Wheelchair Mobility    Modified Rankin (Stroke Patients Only)       Balance Overall balance assessment: Needs assistance Sitting-balance support: Feet supported Sitting balance-Leahy Scale: Good     Standing balance support: Bilateral upper extremity supported Standing balance-Leahy Scale: Fair                              Cognition Arousal/Alertness: Awake/alert Behavior During Therapy: WFL for tasks assessed/performed Overall Cognitive Status: Within Functional Limits for tasks assessed                                        Exercises      General Comments  Pertinent Vitals/Pain Pain Assessment: Faces Faces Pain Scale: Hurts whole lot Pain Location: R ribs Pain Descriptors / Indicators: Guarding;Tender;Grimacing Pain Intervention(s): Monitored during session;Limited activity within patient's tolerance    Home Living                      Prior Function            PT Goals (current goals can now be found in the care plan section) Progress towards PT goals: Progressing toward goals    Frequency    Min 2X/week      PT Plan Discharge plan needs to be updated    Co-evaluation              AM-PAC PT "6 Clicks" Mobility   Outcome Measure  Help needed turning from your back to your side while in a flat bed without using bedrails?: A  Little Help needed moving from lying on your back to sitting on the side of a flat bed without using bedrails?: A Little Help needed moving to and from a bed to a chair (including a wheelchair)?: A Little Help needed standing up from a chair using your arms (e.g., wheelchair or bedside chair)?: A Little Help needed to walk in hospital room?: A Little Help needed climbing 3-5 steps with a railing? : A Lot 6 Click Score: 17    End of Session Equipment Utilized During Treatment: Oxygen;Gait belt Activity Tolerance: Patient limited by pain Patient left: in chair;with call bell/phone within reach Nurse Communication:  (O2) PT Visit Diagnosis: Muscle weakness (generalized) (M62.81);Unsteadiness on feet (R26.81);Difficulty in walking, not elsewhere classified (R26.2);Pain Pain - Right/Left: Right Pain - part of body:  (rib)     Time: 3329-5188 PT Time Calculation (min) (ACUTE ONLY): 39 min  Charges:  $Gait Training: 23-37 mins $Therapeutic Activity: 8-22 mins                    Chesley Noon, PTA 02/11/21, 10:52 AM

## 2021-02-11 NOTE — Care Management Important Message (Signed)
Important Message  Patient Details  Name: Belinda Garcia MRN: [id number] Date of Birth: [birth year]   Medicare Important Message Given:  Yes     Juliann Pulse A Louana Fontenot 02/11/2021, 11:54 AM

## 2021-02-12 ENCOUNTER — Inpatient Hospital Stay: Payer: No Typology Code available for payment source

## 2021-02-12 DIAGNOSIS — S2231XA Fracture of one rib, right side, initial encounter for closed fracture: Secondary | ICD-10-CM | POA: Diagnosis not present

## 2021-02-12 LAB — GLUCOSE, CAPILLARY
Glucose-Capillary: 160 mg/dL — ABNORMAL HIGH (ref 70–99)
Glucose-Capillary: 153 mg/dL — ABNORMAL HIGH (ref 70–99)
Glucose-Capillary: 190 mg/dL — ABNORMAL HIGH (ref 70–99)
Glucose-Capillary: 231 mg/dL — ABNORMAL HIGH (ref 70–99)

## 2021-02-12 NOTE — NC FL2 (Signed)
[place] LEVEL OF CARE SCREENING TOOL     IDENTIFICATION  Patient Name: Belinda Garcia Birthdate: 07/02/1951 Sex: female Admission Date (Current Location): 02/07/2021  [place] and [place] Number:  Engineering geologist and Address:  [facility] [place] [facility], [address], [place], [place] [address]      Provider Number: [id number]  Attending Physician Name and Address:  Antonieta Pert, MD  Relative Name and Phone Number:  Aisley, Whan)   [phone] [facility])    Current Level of Care: Hospital Recommended Level of Care: [place] Prior Approval Number:    Date Approved/Denied:   PASRR Number: [id number]  Discharge Plan: SNF    Current Diagnoses: Patient Active Problem List   Diagnosis Date Noted  . Rib fracture 02/08/2021  . Traumatic subdural hematoma ([place]) 02/08/2021  . Upper abdominal pain 06/17/2018  . Nausea without vomiting 06/17/2018    Orientation RESPIRATION BLADDER Height & Weight     Self,Time,Situation,Place  Normal,O2 Continent Weight: 86.2 kg Height:  5\' 5"  (165.1 cm)  BEHAVIORAL SYMPTOMS/MOOD NEUROLOGICAL BOWEL NUTRITION STATUS     (Traumatic subdural hematoma (HCC)) Continent Diet (Low sodium heart healthy)  AMBULATORY STATUS COMMUNICATION OF NEEDS Skin   Extensive Assist (Dizziness, drop O2 Sat) Verbally Bruising (R side echymosis with staples)                       Personal Care Assistance Level of Assistance  Bathing,Feeding,Dressing Bathing Assistance: Limited assistance Feeding assistance: Limited assistance (set up) Dressing Assistance: Limited assistance     Functional Limitations Info  Sight,Hearing,Speech Sight Info: Adequate Hearing Info: Adequate Speech Info: Adequate    SPECIAL CARE FACTORS FREQUENCY  PT (By licensed PT),OT (By licensed OT)     PT Frequency: min 5x weekly OT Frequency: Min 5x weekly            Contractures Contractures Info: Not present    Additional  Factors Info  Code Status,Allergies Code Status Info: FULL CODE Allergies Info: No Known Allergies           Current Medications (02/12/2021):  This is the current hospital active medication list Current Facility-Administered Medications  Medication Dose Route Frequency Provider Last Rate Last Admin  . acetaminophen (TYLENOL) tablet 650 mg  650 mg Oral Q6H PRN Mansy, Jan A, MD       Or  . acetaminophen (TYLENOL) suppository 650 mg  650 mg Rectal Q6H PRN Mansy, Jan A, MD      . alum & mag hydroxide-simeth (MAALOX/MYLANTA) 200-200-20 MG/5ML suspension 30 mL  30 mL Oral PRN Mansy, Jan A, MD      . atorvastatin (LIPITOR) tablet 40 mg  40 mg Oral QHS Mansy, Jan A, MD   40 mg at 02/11/21 2119  . bismuth subsalicylate (PEPTO BISMOL) chewable tablet 262 mg  262 mg Oral TID BM PRN Mansy, Jan A, MD      . buPROPion [facility] SR) 12 hr tablet 150 mg  150 mg Oral q morning Mansy, Jan A, MD   150 mg at 02/12/21 8469  . cholecalciferol (VITAMIN D3) tablet 1,000 Units  1,000 Units Oral Daily Mansy, Arvella Merles, MD   1,000 Units at 02/12/21 [phone]  . diazepam (VALIUM) tablet 10 mg  10 mg Oral QHS PRN Mansy, Jan A, MD   10 mg at 02/11/21 2119  . doxepin (SINEQUAN) capsule 10 mg  10 mg Oral QHS Mansy, Jan A, MD   10 mg at 02/11/21  2120  . DULoxetine (CYMBALTA) DR capsule 60 mg  60 mg Oral Daily Mansy, Jan A, MD   60 mg at 02/12/21 0829  . feeding supplement (ENSURE ENLIVE / ENSURE PLUS) liquid 237 mL  237 mL Oral BID BM Kc, Ramesh, MD   237 mL at 02/12/21 0836  . fluticasone (FLONASE) 50 MCG/ACT nasal spray 2 spray  2 spray Each Nare Daily PRN Mansy, Jan A, MD      . guaiFENesin-dextromethorphan (ROBITUSSIN DM) 100-10 MG/5ML syrup 10 mL  10 mL Oral Q4H PRN Kc, Ramesh, MD   10 mL at 02/11/21 2120  . hydrochlorothiazide (HYDRODIURIL) tablet 25 mg  25 mg Oral Daily Mansy, Jan A, MD   25 mg at 02/12/21 8295  . insulin aspart (novoLOG) injection 0-9 Units  0-9 Units Subcutaneous TID [facility] Mansy, Arvella Merles, MD   2 Units at  02/12/21 1242  . isosorbide mononitrate (IMDUR) 24 hr tablet 60 mg  60 mg Oral Daily Mansy, Jan A, MD   60 mg at 02/12/21 0829  . levETIRAcetam (KEPPRA) tablet 500 mg  500 mg Oral BID Antonieta Pert, MD   500 mg at 02/12/21 0829  . levothyroxine (SYNTHROID) tablet 125 mcg  125 mcg Oral Q0600 Mansy, Jan A, MD   125 mcg at 02/12/21 0557  . lidocaine (LIDODERM) 5 % 1 patch  1 patch Transdermal Q24H Antonieta Pert, MD   1 patch at 02/12/21 1243  . lidocaine (LIDODERM) 5 % 1 patch  1 patch Transdermal Q24H Antonieta Pert, MD   1 patch at 02/12/21 1243  . loperamide (IMODIUM) capsule 2 mg  2 mg Oral PRN Mansy, Jan A, MD      . meclizine (ANTIVERT) tablet 25 mg  25 mg Oral TID PRN Mansy, Jan A, MD   25 mg at 02/09/21 2133  . metoprolol succinate (TOPROL-XL) 24 hr tablet 25 mg  25 mg Oral Daily Mansy, Jan A, MD   25 mg at 02/12/21 0836  . montelukast (SINGULAIR) tablet 10 mg  10 mg Oral QHS Mansy, Jan A, MD   10 mg at 02/11/21 2120  . morphine 2 MG/ML injection 2 mg  2 mg Intravenous Q2H PRN Sharion Settler, NP   2 mg at 02/10/21 0737  . nitroGLYCERIN (NITROSTAT) SL tablet 0.4 mg  0.4 mg Sublingual Q5 min PRN Mansy, Jan A, MD      . oxyCODONE-acetaminophen (PERCOCET/ROXICET) 5-325 MG per tablet 1 tablet  1 tablet Oral Q4H PRN Mansy, Jan A, MD   1 tablet at 02/11/21 1130   And  . oxyCODONE (Oxy IR/ROXICODONE) immediate release tablet 5 mg  5 mg Oral Q6H PRN Mansy, Jan A, MD   5 mg at 02/12/21 0557  . pantoprazole (PROTONIX) EC tablet 40 mg  40 mg Oral Daily Mansy, Jan A, MD   40 mg at 02/12/21 6213  . potassium chloride (KLOR-CON) CR tablet 10 mEq  10 mEq Oral BID Mansy, Jan A, MD   10 mEq at 02/12/21 0865  . pregabalin (LYRICA) capsule 75 mg  75 mg Oral BID Mansy, Jan A, MD   75 mg at 02/12/21 7846  . senna-docusate (Senokot-S) tablet 1 tablet  1 tablet Oral [id number] Antonieta Pert, MD   1 tablet at 02/12/21 0557     Discharge Medications: Please see discharge summary for a list of discharge medications.  Relevant  Imaging Results:  Relevant Lab Results:   Additional Information [id number]  Pete Pelt, RN

## 2021-02-12 NOTE — Plan of Care (Signed)
End of Shift Summary:  Alert and oriented x4. VSS. Remained on 2L via [place], saturations >94%. Pain managed with prn medications. Denies n/v or dizziness. Urine output adequate. No BM this shift. Remained free from falls or injury. Bed low and in locked position with bed alarm on. Call bell within reach and able to use.    Problem: Education: Goal: Knowledge of General Education information will improve Description: Including pain rating scale, medication(s)/side effects and non-pharmacologic comfort measures Outcome: Progressing   Problem: Health Behavior/Discharge Planning: Goal: Ability to manage health-related needs will improve Outcome: Progressing   Problem: Clinical Measurements: Goal: Respiratory complications will improve Outcome: Progressing   Problem: Activity: Goal: Risk for activity intolerance will decrease Outcome: Progressing   Problem: Pain Managment: Goal: General experience of comfort will improve Outcome: Progressing   Problem: Safety: Goal: Ability to remain free from injury will improve Outcome: Progressing

## 2021-02-12 NOTE — TOC Progression Note (Signed)
Transition of Care [facility]) - Progression Note    Patient Details  Name: Belinda Garcia MRN: [id number] Date of Birth: [birth year]  Transition of Care [facility]) CM/SW [place], RN Phone Number: 02/12/2021, 2:47 PM  Clinical Narrative: TOC in to see patient, recommendations changed to SNF.  Patient states that she cares for husband at home, as he has dementia and at this time, she does not have assistance.  Son lives at home, but works during the day.  Patient did, however, consent to SNF workup at this time.  Patient prefers [facility], TOC informed patient that we can try but there is no guarantee for placement, patient verbalized understanding.  TOC contact information given, TOC to follow through discharge.       Expected Discharge Plan and Services                                                 Social Determinants of Health (SDOH) Interventions    Readmission Risk Interventions No flowsheet data found.

## 2021-02-12 NOTE — Progress Notes (Signed)
Physical Therapy Treatment Patient Details Name: Belinda Garcia MRN: [id number] DOB: [birth year] Today's Date: 02/12/2021    History of Present Illness Pt is a 70 y/o F admitted with c/c of fall & hit her head with short LOC. Pt found to have minimally displaced R posterior lateral 7th rib fx, pneumothorax of less than 1%, & acute L convexity SDH measuring up to 5 mm thickness with no midline shift. PMH: HTN, dyslipidemia, COPD on home O2, GERD, fibromyalgia, DM2, anxiety, OA, hypothyroidism, hx of PE    PT Comments    Pt was sitting in recliner without O2 donned upon arriving. Sao2 on rm air 84%. Reapplied 3 L o2 with prolonged recovery to achieve >90%." I forgot to put it back on." CT scan this morning and MD arrived during session to discuss results. Cleared pt for participation and encouraged DC to SNF. Resting BP in sitting 108/57. After standing to RW for two minutes BP 115/47. Pt has dizziness at rest and states it did not get worst throughout session however continues to impact her safety with mobility. Author high suggest DC to SNF to address deficits prior to returning home. She was repositioned in recliner post session with call bell in reach and chair alarm in place. Encouraged pt leaving O2 on all the time.      Follow Up Recommendations  SNF     Equipment Recommendations  Other (comment) (defer to next level of care)       Precautions / Restrictions Precautions Precautions: Fall Precaution Comments: dizziness Restrictions Weight Bearing Restrictions: No    Mobility  Bed Mobility    General bed mobility comments: in recliner pre/post    Transfers Overall transfer level: Needs assistance Equipment used: Rolling walker (2 wheeled) Transfers: Sit to/from Stand Sit to Stand: Min guard         General transfer comment: CGA for safety. pt endorses dizziness pre and post transfers. BP in sitting 108/57, after 2 min standing 115/47  Ambulation/Gait Ambulation/Gait  assistance: Min assist Gait Distance (Feet): 50 Feet Assistive device: Rolling walker (2 wheeled) Gait Pattern/deviations: Trunk flexed;Step-to pattern Gait velocity: decreased   General Gait Details: pt has slow steady gait kinematics. poor posture but pt states she has had LB issues for years. Min assist with navigating RW and during turns to prevent fall.       Balance Overall balance assessment: Needs assistance Sitting-balance support: Feet supported Sitting balance-Leahy Scale: Good     Standing balance support: Bilateral upper extremity supported Standing balance-Leahy Scale: Fair     Cognition Arousal/Alertness: Awake/alert Behavior During Therapy: WFL for tasks assessed/performed Overall Cognitive Status: Within Functional Limits for tasks assessed  General Comments: Pt pleasant and motivated. Wants to go home to care for spouse bt is agreeable to SNF prior to returning home.             Pertinent Vitals/Pain Pain Assessment: 0-10 Pain Score: 4  Faces Pain Scale: Hurts a little bit Pain Location: R ribs- denied pain medicine prior to session but requested after activity Pain Descriptors / Indicators: Guarding;Tender;Grimacing Pain Intervention(s): Limited activity within patient's tolerance;Monitored during session;Repositioned;Other (comment) (Rib pain with cough)           PT Goals (current goals can now be found in the care plan section) Acute Rehab PT Goals Patient Stated Goal: to get stronger and be able to go home Progress towards PT goals: Progressing toward goals    Frequency    Min 2X/week  PT Plan Current plan remains appropriate       AM-PAC PT "6 Clicks" Mobility   Outcome Measure  Help needed turning from your back to your side while in a flat bed without using bedrails?: A Little Help needed moving from lying on your back to sitting on the side of a flat bed without using bedrails?: A Little Help needed moving to and from a bed  to a chair (including a wheelchair)?: A Little Help needed standing up from a chair using your arms (e.g., wheelchair or bedside chair)?: A Little Help needed to walk in hospital room?: A Little Help needed climbing 3-5 steps with a railing? : A Little 6 Click Score: 18    End of Session Equipment Utilized During Treatment: Oxygen;Gait belt Activity Tolerance: Patient limited by fatigue;Other (comment) (dizziness) Patient left: in chair;with call bell/phone within reach Nurse Communication: Mobility status PT Visit Diagnosis: Muscle weakness (generalized) (M62.81);Unsteadiness on feet (R26.81);Difficulty in walking, not elsewhere classified (R26.2);Pain Pain - Right/Left: Right     Time: 8469-6295 PT Time Calculation (min) (ACUTE ONLY): 28 min  Charges:  $Gait Training: 8-22 mins $Therapeutic Activity: 8-22 mins                     Julaine Fusi PTA 02/12/21, 12:47 PM

## 2021-02-12 NOTE — Progress Notes (Signed)
Patient upset because nurse tech in room cleaning up and NT threw away some old opened food items into the trash.  Patient assured that she can have another sandwich tray if she wants another.  Pt states that her son brought her that food and she did not want it thrown away. Patient insisted that she be allowed to sit on the side of the bed, educated that she is unsteady and needs assistance if she gets up.  Patient proceded to begin putting her clothes on to leave and states that she is signing herself out.  MD notified.

## 2021-02-12 NOTE — Progress Notes (Signed)
Patient very upset and wanting to sign out AMA. 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 She expresses feelings of frustration, sadness and worry regarding being away from him. Patient's feelings validated. Pt encouraged to make sure she is well enough and strong enough before returning home, as she won't be any help to her husband if she doesn't take care of herself first. Pt verbalizes understanding. Spent a great deal of time sitting with patient and listening to her talk about her life and husband. Pt eventually became calm in her actions and responses, agreeable to at least staying until tomorrow to continue to work with PT/OT.  Patient's son also spoken with when he called into the room during encounter. Son to visit shortly and will encourage mom to stay as well.  Pt did refuse bed alarm to be activated. Floor mats in place. Dinner tray set up for patient.

## 2021-02-12 NOTE — Progress Notes (Signed)
Occupational Therapy Treatment Patient Details Name: KADYNCE BONDS MRN: [id number] DOB: 17-Oct-1950 Today's Date: 02/12/2021    History of present illness Pt is a 70 y/o F admitted with c/c of fall & hit her head with short LOC. Pt found to have minimally displaced R posterior lateral 7th rib fx, pneumothorax of less than 1%, & acute L convexity SDH measuring up to 5 mm thickness with no midline shift. PMH: HTN, dyslipidemia, COPD on home O2, GERD, fibromyalgia, DM2, anxiety, OA, hypothyroidism, hx of PE   OT comments  Pt seen for OT tx this date. Pt eager to participate and wash up. Pt set up with items for sponge bath seated in recliner. Pt able to perform UB bathing and partial LB bathing while seated, stood with CGA and UE support on RW to wash up, requiring assist only for peri washing and for BLE distal of knees. Pt instructed in ECS to incorporate into task to minimize over exertion. SpO2 around 90% on room air briefly to change gowns, reapplied, up to 95%. Pt required assist to reapply nasal cannula after demonstrating difficulty with sequencing the task. Continue to recommend SNF for short term rehab.    Follow Up Recommendations  SNF    Equipment Recommendations  3 in 1 bedside commode    Recommendations for Other Services      Precautions / Restrictions Precautions Precautions: Fall Precaution Comments: dizziness, monitor O2 Restrictions Weight Bearing Restrictions: No       Mobility Bed Mobility               General bed mobility comments: in recliner pre/post    Transfers Overall transfer level: Needs assistance Equipment used: Rolling walker (2 wheeled) Transfers: Sit to/from Stand Sit to Stand: Min guard         General transfer comment: increased time/effort, cues for hand placement    Balance Overall balance assessment: Needs assistance Sitting-balance support: Feet supported Sitting balance-Leahy Scale: Good     Standing balance support:  Bilateral upper extremity supported;Single extremity supported;During functional activity Standing balance-Leahy Scale: Fair Standing balance comment: reliance on RW                           ADL either performed or assessed with clinical judgement   ADL Overall ADL's : Needs assistance/impaired     Grooming: Set up;Sitting   Upper Body Bathing: Set up;Sitting   Lower Body Bathing: Minimal assistance;Moderate assistance;Sit to/from stand Lower Body Bathing Details (indicate cue type and reason): Able to wash proximal BLE                             Vision Patient Visual Report: No change from baseline     Perception     Praxis      Cognition Arousal/Alertness: Awake/alert Behavior During Therapy: WFL for tasks assessed/performed Overall Cognitive Status: Within Functional Limits for tasks assessed                                 General Comments: Pt pleasant and motivated. Wants to go home to care for spouse bt is agreeable to SNF prior to returning home.        Exercises Other Exercises Other Exercises: Pt instructed in incorporating ECS into seated sponge bath activity, Max A for peri washing and for BLE distal of  knees   Shoulder Instructions       General Comments      Pertinent Vitals/ Pain       Pain Assessment: 0-10 Pain Score: 7  Faces Pain Scale: Hurts a little bit Pain Location: sternal/ribs pain Pain Descriptors / Indicators: Guarding;Tender;Grimacing Pain Intervention(s): Limited activity within patient's tolerance;Monitored during session;Repositioned;Patient requesting pain meds-RN notified  Home Living                                          Prior Functioning/Environment              Frequency  Min 1X/week        Progress Toward Goals  OT Goals(current goals can now be found in the care plan section)  Progress towards OT goals: Progressing toward goals  Acute Rehab OT  Goals Patient Stated Goal: to get stronger and be able to go home OT Goal Formulation: With patient Time For Goal Achievement: 02/23/21 Potential to Achieve Goals: Good  Plan Discharge plan remains appropriate;Frequency remains appropriate    Co-evaluation                 AM-PAC OT "6 Clicks" Daily Activity     Outcome Measure   Help from another person eating meals?: None Help from another person taking care of personal grooming?: None Help from another person toileting, which includes using toliet, bedpan, or urinal?: A Little Help from another person bathing (including washing, rinsing, drying)?: A Little Help from another person to put on and taking off regular upper body clothing?: A Little Help from another person to put on and taking off regular lower body clothing?: A Lot 6 Click Score: 19    End of Session Equipment Utilized During Treatment: Rolling walker;Oxygen  OT Visit Diagnosis: Unsteadiness on feet (R26.81);Muscle weakness (generalized) (M62.81);Pain   Activity Tolerance Patient tolerated treatment well   Patient Left in chair;with call bell/phone within reach;with chair alarm set   Nurse Communication Patient requests pain meds        Time: 1341-1405 OT Time Calculation (min): 24 min  Charges: OT General Charges $OT Visit: 1 Visit OT Treatments $Self Care/Home Management : 23-37 mins  Hanley Hays, MPH, MS, OTR/L ascom [phone] 02/12/21, 2:15 PM

## 2021-02-13 DIAGNOSIS — S0990XA Unspecified injury of head, initial encounter: Secondary | ICD-10-CM | POA: Diagnosis not present

## 2021-02-13 LAB — GLUCOSE, CAPILLARY
Glucose-Capillary: 167 mg/dL — ABNORMAL HIGH (ref 70–99)
Glucose-Capillary: 274 mg/dL — ABNORMAL HIGH (ref 70–99)
Glucose-Capillary: 267 mg/dL — ABNORMAL HIGH (ref 70–99)
Glucose-Capillary: 259 mg/dL — ABNORMAL HIGH (ref 70–99)

## 2021-02-13 NOTE — Progress Notes (Signed)
   02/13/21 1241  Clinical Encounter Type  Visited With Patient  Visit Type Initial  Referral From Nurse  Consult/Referral To Chaplain  Spiritual Encounters  Spiritual Needs Prayer;Emotional  Chaplain Donnelle Olmeda responded to an OR for room 126A, Pt [name]. [name] stated, she fell down and cracked her head, and broke one of her ribs. She stated she is doing a lot better today but just having a hard time breathing and accepting all that has happened to her. I provided reflective listening, emotional and spiritual support and prayer was given and received at the end of the visit.

## 2021-02-13 NOTE — Hospital Course (Signed)
83 white female community dwelling COPD home oxygen 2 L Fibromyalgia (sees Dr. [name] for longstanding lower back pain) anxiety Hypothyroid HTN HLD reflux Prior PE off anticoagulation since 10 years -Fall was accidental-knee locked up on her before she fell-hit right side of head with questionable LOC-immediate headache without dizziness blurred vision Labs show hypokalemia hyponatremia WBC 16  Imaging = trace anterior right basilar pneumothorax-acute ninth rib anterolateral fracture suspected CT head = left convexity subdural hematoma 5 mm thick no midline shift CT scan head 6 hours subsequently = no interval progression left subdural hematoma  Initial Rx Keppra 500 twice daily, Lidoderm patch   WBC 16-->12.5 MCV 101 BUNs/creatinine 17/0.5  Dizziness at rest [facility]ers [facility]

## 2021-02-13 NOTE — Progress Notes (Addendum)
PROGRESS NOTE   Belinda Garcia  NTI:[id number] DOB: 01-11-1951 DOA: 02/07/2021 PCP: [place] Pink, MD  Brief Narrative:  59 white female community dwelling COPD home oxygen 2 L Fibromyalgia (sees Dr. [name] for longstanding lower back pain) anxiety Hypothyroid HTN HLD reflux Prior PE off anticoagulation since 10 years -Fall was accidental-knee locked up on her before she fell-hit right side of head with questionable LOC-immediate headache without dizziness blurred vision Labs show hypokalemia hyponatremia WBC 16  Imaging = trace anterior right basilar pneumothorax-acute ninth rib anterolateral fracture suspected CT head = left convexity subdural hematoma 5 mm thick no midline shift CT scan head 6 hours subsequently = no interval progression left subdural hematoma  Initial Rx Keppra 500 twice daily, Lidoderm patch  Hospital-Problem based course  5 mm subdural hematoma without midline shift Patient seen by Dr. Cari Caraway of neurosurgery Per them continue Keppra 500 twice daily 1 month follow-up for head CT Remove staples from scalp 5/12 Syncope in the setting of?  BPPV Patient relates has had "dizziness"-(characterized by room spinning not really responsive to meclizine) for the past 6 months I will ask physical therapy to prioritize her and screen for BPPV Hold meclizine at this time Right basilar pneumothorax Evaluated by surgery and no further needs per them Continue lidocaine patch Tylenol oxycodone COPD chronic respiratory failure on 2 L Patient educated on incentive spirometry and use Stable at this time continue with Robitussin 10 mL every 4 as needed Singulair 10 daily at bedtime Flonase Chronic back pain followed by Dr. [name] Continue meds as above Hypokalemia Continue K. Dur 10 twice daily Prior pulmonary embolism No further anticoagulation at this time Depression Continue Wellbutrin 150, Valium 10 at bedtime as needed anxiety, doxepin 10 at bedtime, Cymbalta 60 DM  TY 2 On metaglip twice daily at home-hold at this time Sliding scale for now   DVT prophylaxis: Lovenox Code Status: Full Family Communication: None Disposition:  Status is: Observation  The patient remains OBS appropriate and will d/c before 2 midnights.  Dispo: The patient is from: Home              Anticipated d/c is to: SNF              Patient currently is not medically stable to d/c.   Difficult to place patient No       Consultants:   Neurosurgery Dr. Cari Caraway on admission  General surgery  Procedures: Multiple  Antimicrobials: None   Subjective: Awake alert no distress tells me occasionally dizzy at times for the past 6 months Also tells me she has had a growth over her left eye (pterygium)?  Which may contribute to her dizziness/lack of visual acuity? She feels overall better She is willing to go to rehab  Objective: Vitals:   02/13/21 0420 02/13/21 0742 02/13/21 1137 02/13/21 1533  BP: 125/64 (!) 120/53 (!) 125/45 (!) 131/47  Pulse: 75 72 69 78  Resp: 20 19 20 18   Temp: 98.4 F (36.9 C) 98.1 F (36.7 C) 98.5 F (36.9 C) 99 F (37.2 C)  TempSrc: Oral Oral Oral   SpO2: 93% 95% 97% 96%  Weight:      Height:        Intake/Output Summary (Last 24 hours) at 02/13/2021 1557 Last data filed at 02/13/2021 1350 Gross per 24 hour  Intake 574 ml  Output 950 ml  Net -376 ml   Filed Weights   02/07/21 1805  Weight: 86.2 kg    Examination:  Pterygium left eye Neck soft supple no lymphadenopathy thyroid normal CTA B no added sound no rales rhonchi S1-S2 no murmur telemetry reviewed sinus rhythm Abdomen soft no rebound no guarding Neurologically intact moving all 4 limbs power 5/5 sensory grossly intact reflexes deferred  Data Reviewed: personally reviewed   None today  Radiology Studies: CT HEAD WO CONTRAST  Result Date: 02/12/2021 CLINICAL DATA:  Recent subdural hematoma EXAM: CT HEAD WITHOUT CONTRAST TECHNIQUE: Contiguous axial images  were obtained from the base of the skull through the vertex without intravenous contrast. COMPARISON:  Feb 08, 2021 FINDINGS: Brain: There is again noted a subdural hematoma with increased attenuation fluid in the left superior temporal region with a maximum thickness of 5 mm near the sylvian fissure, unchanged. No progression or new subdural hematoma is appreciated. This subdural hematoma causes modest localized mass effect, stable, without midline shift. No intra-axial hemorrhage noted. No mass. There is mild age related volume loss. There is mild decreased attenuation in the periventricular white matter. No acute infarct is evident. Vascular: No hyperdense vessel. Calcification in each carotid siphon region is stable. Skull: Bony calvarium appears intact.  Stable scalp staples. Sinuses/Orbits: Air-fluid level left maxillary antrum. Mucosal thickening in left sphenoid sinus and in multiple ethmoid air cells. Small air-fluid levels in each inferior frontal sinus. Orbits appear symmetric bilaterally except for evidence of previous cataract removal on the left. Other: Mastoid air cells clear. Apparent skin nevus right frontal region. IMPRESSION: Stable left-sided temporal region subdural hematoma measuring 5 mm in maximal thickness. Modest mass effect locally. No appreciable new or progressing extra-axial fluid collection. No intra-axial hemorrhage. There is mild periventricular small vessel disease. No acute infarct evident. No midline shift. Foci of arterial vascular calcification noted. Multiple foci of paranasal sinus disease noted. Electronically Signed   By: Lowella Grip III M.D.   On: 02/12/2021 11:34     Scheduled Meds: . atorvastatin  40 mg Oral QHS  . buPROPion  150 mg Oral q morning  . cholecalciferol  1,000 Units Oral Daily  . doxepin  10 mg Oral QHS  . DULoxetine  60 mg Oral Daily  . feeding supplement  237 mL Oral BID BM  . insulin aspart  0-9 Units Subcutaneous TID AC & HS  . isosorbide  mononitrate  60 mg Oral Daily  . levETIRAcetam  500 mg Oral BID  . levothyroxine  125 mcg Oral Q0600  . lidocaine  1 patch Transdermal Q24H  . lidocaine  1 patch Transdermal Q24H  . metoprolol succinate  25 mg Oral Daily  . montelukast  10 mg Oral QHS  . pantoprazole  40 mg Oral Daily  . potassium chloride  10 mEq Oral BID  . pregabalin  75 mg Oral BID  . senna-docusate  1 tablet Oral Q0600   Continuous Infusions:   LOS: 5 days   Time spent: Daisy, MD [facility] To contact the attending provider between 7A-7P or the covering provider during after hours 7P-7A, please log into the web site [email or web address] and access using universal [place] password for that web site. If you do not have the password, please call the hospital operator.  02/13/2021, 3:57 PM

## 2021-02-13 NOTE — Progress Notes (Signed)
Got orthostatic vitals:   Lying BP  120/58  Pulse 74, sitting BP 123/61 Pulse 77, Standing at 0 minutes BP  130/55  Pulse 77, standing at 3 minutes BP  112/66 , patient did report getting somewhat dizzy standing at 3 minutes.

## 2021-02-13 NOTE — Progress Notes (Signed)
Physical Therapy Treatment Patient Details Name: Belinda Garcia MRN: [id number] DOB: 15-Jan-1951 Today's Date: 02/13/2021    History of Present Illness Pt is a 70 y/o F admitted with c/c of fall & hit her head with short LOC. Pt found to have minimally displaced R posterior lateral 7th rib fx, pneumothorax of less than 1%, & acute L convexity SDH measuring up to 5 mm thickness with no midline shift. PMH: HTN, dyslipidemia, COPD on home O2, GERD, fibromyalgia, DM2, anxiety, OA, hypothyroidism, hx of PE    PT Comments    Pt willing to participate with PT and was able to do more than previous sessions with PT.  Additionally screened and evaluated for BPPV, she had (-) DixHallpike bilaterally and does not endorse symptoms truly congruent with BPPV.  Pt with much slower than baseline ambulation with walker, but was able to manage ~100 ft safely and maintained O2 >88% on room air the the effort.  Pt still with some concern about managing husband at home, but agrees that home with HHPT is likely better for her than rehab given her continued improvement.  (son present during later check back and in agreement as well).  Recommending f/u with ENT 2/2 ongoing dizziness/vestibular symptoms.     Follow Up Recommendations  Home health PT;Supervision - Intermittent     Equipment Recommendations  3in1 (PT)    Recommendations for Other Services       Precautions / Restrictions Precautions Precautions: Fall Precaution Comments: dizziness, monitor O2 Restrictions Weight Bearing Restrictions: No    Mobility  Bed Mobility Overal bed mobility: Needs Assistance Bed Mobility: Supine to Sit;Sit to Supine     Supine to sit: Min assist;Mod assist Sit to supine: Mod assist   General bed mobility comments: R pain with getting to/from supine    Transfers Overall transfer level: Needs assistance Equipment used: Rolling walker (2 wheeled) Transfers: Sit to/from Stand Sit to Stand: Supervision Stand  pivot transfers: Min guard       General transfer comment: Able to rise w/o assist, somewhat guarded and hesitant but not unsafe  Ambulation/Gait Ambulation/Gait assistance: Min assist Gait Distance (Feet): 100 Feet Assistive device: Rolling walker (2 wheeled)       General Gait Details: pt has slow steady gait kinematics. poor posture but pt states she has had LB issues for years. Pt able to maintain O2 89-93% t/o the effort on room air.  Some fatigue, reports much slower than baseline but not unsafe and w/o excessive faitgue   Stairs             Wheelchair Mobility    Modified Rankin (Stroke Patients Only)       Balance Overall balance assessment: Needs assistance Sitting-balance support: Feet supported Sitting balance-Leahy Scale: Good     Standing balance support: Bilateral upper extremity supported;Single extremity supported;During functional activity Standing balance-Leahy Scale: Fair Standing balance comment: forward leaning and reliant on walker, but no LOBs                            Cognition Arousal/Alertness: Awake/alert Behavior During Therapy: WFL for tasks assessed/performed Overall Cognitive Status: Within Functional Limits for tasks assessed                                        Exercises      General Comments General  comments (skin integrity, edema, etc.): Pt describes 6-8 months of intermittent dizziness but does not endorse symptoms congruent with BPPV.  Performed DixHallpike bilaterally with (-) response, mild and short lived dizziness (no vertigo) on getting back to sitting.  Recommending further vestibular follow up with ENT (likely as outpatient)      Pertinent Vitals/Pain Pain Assessment: Faces Faces Pain Scale: Hurts little more Pain Location: R rib pain    Home Living                      Prior Function            PT Goals (current goals can now be found in the care plan section)  Progress towards PT goals: Progressing toward goals    Frequency    Min 2X/week      PT Plan Discharge plan needs to be updated    Co-evaluation              AM-PAC PT "6 Clicks" Mobility   Outcome Measure  Help needed turning from your back to your side while in a flat bed without using bedrails?: A Little Help needed moving from lying on your back to sitting on the side of a flat bed without using bedrails?: A Little Help needed moving to and from a bed to a chair (including a wheelchair)?: A Little Help needed standing up from a chair using your arms (e.g., wheelchair or bedside chair)?: A Little Help needed to walk in hospital room?: A Little Help needed climbing 3-5 steps with a railing? : A Little 6 Click Score: 18    End of Session Equipment Utilized During Treatment: Oxygen;Gait belt (3L on arrival, O2 in the high 90s, mid 90s on room air at rest, stayed ~90% on room air during ambulation) Activity Tolerance: Patient limited by fatigue;Other (comment) Patient left: in chair;with call bell/phone within reach Nurse Communication: Mobility status PT Visit Diagnosis: Muscle weakness (generalized) (M62.81);Unsteadiness on feet (R26.81);Difficulty in walking, not elsewhere classified (R26.2);Pain Pain - Right/Left: Right Pain - part of body:  (ribs)     Time: 5053-9767 PT Time Calculation (min) (ACUTE ONLY): 73 min  Charges:  $Gait Training: 8-22 mins $Therapeutic Exercise: 8-22 mins $Therapeutic Activity: 8-22 mins $[place]alith Rep Proc: 8-22 mins                     Kreg Shropshire, DPT 02/13/2021, 5:33 PM

## 2021-02-13 NOTE — Care Management Obs Status (Signed)
[facility] NOTIFICATION   Patient Details  Name: Belinda Garcia MRN: [id number] Date of Birth: 09-26-1951   Medicare Observation Status Notification Given:  Yes      Pete Pelt, RN 02/13/2021, 12:53 PM

## 2021-02-13 NOTE — Plan of Care (Signed)
End of Shift Summary:   Alert and oriented x4. VSS. Remained on 2L via , saturations >94%. Pain managed with prn medications. Robitussin for cough. Denies n/v or dizziness. Remained free from falls or injury. Bed low and in locked position with bed alarm on. Call bell within reach and able to use.      Problem: Education: Goal: Knowledge of General Education information will improve Description: Including pain rating scale, medication(s)/side effects and non-pharmacologic comfort measures Outcome: Progressing   Problem: Health Behavior/Discharge Planning: Goal: Ability to manage health-related needs will improve Outcome: Progressing   Problem: Clinical Measurements: Goal: Respiratory complications will improve Outcome: Progressing   Problem: Activity: Goal: Risk for activity intolerance will decrease Outcome: Progressing   Problem: Pain Managment: Goal: General experience of comfort will improve Outcome: Progressing   Problem: Safety: Goal: Ability to remain free from injury will improve Outcome: Progressing

## 2021-02-14 DIAGNOSIS — S0990XA Unspecified injury of head, initial encounter: Secondary | ICD-10-CM | POA: Diagnosis not present

## 2021-02-14 LAB — COMPREHENSIVE METABOLIC PANEL
ALT: 21 U/L (ref 0–44)
AST: 16 U/L (ref 15–41)
Albumin: 3.2 g/dL — ABNORMAL LOW (ref 3.5–5.0)
Alkaline Phosphatase: 77 U/L (ref 38–126)
Anion gap: 10 (ref 5–15)
BUN: 30 mg/dL — ABNORMAL HIGH (ref 8–23)
CO2: 30 mmol/L (ref 22–32)
Calcium: 9.4 mg/dL (ref 8.9–10.3)
Chloride: 96 mmol/L — ABNORMAL LOW (ref 98–111)
Creatinine, Ser: 0.65 mg/dL (ref 0.44–1.00)
GFR, Estimated: >60 mL/min (ref >60)
Glucose, Bld: 201 mg/dL — ABNORMAL HIGH (ref 70–99)
Potassium: 4.1 mmol/L (ref 3.5–5.1)
Sodium: 136 mmol/L (ref 135–145)
Total Bilirubin: 0.8 mg/dL (ref 0.3–1.2)
Total Protein: 6.4 g/dL — ABNORMAL LOW (ref 6.5–8.1)

## 2021-02-14 LAB — GLUCOSE, CAPILLARY
Glucose-Capillary: 237 mg/dL — ABNORMAL HIGH (ref 70–99)
Glucose-Capillary: 305 mg/dL — ABNORMAL HIGH (ref 70–99)
Glucose-Capillary: 259 mg/dL — ABNORMAL HIGH (ref 70–99)

## 2021-02-14 LAB — SARS CORONAVIRUS 2 (TAT 6-24 HRS): SARS Coronavirus 2: NEGATIVE

## 2021-02-14 NOTE — TOC Transition Note (Signed)
Transition of Care [facility]) - CM/SW Discharge Note   Patient Details  Name: Belinda Garcia MRN: [id number] Date of Birth: Oct 03, 1951  [facility] CM/SW Contact:  Pete Pelt, RN Phone Number: 02/14/2021, 4:06 PM   Clinical Narrative:   Note:  [facility] unable to take patient due to geographical limitations.  [facility] set up with [facility] from [place], who confirmed acceptance.          Patient Goals and CMS Choice        Discharge Placement                       Discharge Plan and Services                                     Social Determinants of Health (SDOH) Interventions     Readmission Risk Interventions No flowsheet data found.

## 2021-02-14 NOTE — Progress Notes (Addendum)
Physical Therapy Treatment Patient Details Name: Belinda Garcia MRN: [id number] DOB: 08-14-1951 Today's Date: 02/14/2021    History of Present Illness Pt is a 70 y/o F admitted with c/c of fall & hit her head with short LOC. Pt found to have minimally displaced R posterior lateral 7th rib fx, pneumothorax of less than 1%, & acute L convexity SDH measuring up to 5 mm thickness with no midline shift. PMH: HTN, dyslipidemia, COPD on home O2, GERD, fibromyalgia, DM2, anxiety, OA, hypothyroidism, hx of PE    PT Comments    Pt was long sitting in bed upon arriving. She is A and O x 4. O2 [place] laying beside pt on bed. sao2 86% on rm air at rest. Took ~ 2 minutes on 2 L to recover to 91%. Pt required min-mod assist to exit L side of bed. Pt stood and ambulated with RW with CGA. Required increased O2 to 3 L during gait training. After ambulating ~ 70 ft pt c/o severe dizziness and requested to return to room. Overall pt is safe with mobility, transfers, and gait. Pt eager to get home. Lives with spouse and son. Will need home O2, B in 1, and HH services to progress pt to PLOF.   Follow Up Recommendations  Home health PT;Supervision - Intermittent     Equipment Recommendations  3in1 (PT)       Precautions / Restrictions Precautions Precautions: Fall Precaution Comments: dizziness, monitor O2 Restrictions Weight Bearing Restrictions: No    Mobility  Bed Mobility Overal bed mobility: Needs Assistance Bed Mobility: Supine to Sit;Sit to Supine     Supine to sit: Min assist;Mod assist Sit to supine: Min assist   General bed mobility comments: Min assist to exit L side of bed    Transfers Overall transfer level: Needs assistance Equipment used: Rolling walker (2 wheeled) Transfers: Sit to/from Stand Sit to Stand: Supervision         General transfer comment: stood to RW without physical assistance  Ambulation/Gait Ambulation/Gait assistance: Min guard Gait Distance (Feet): 100  Feet Assistive device: Rolling walker (2 wheeled)   Gait velocity: decreased   General Gait Details: Pt ambulated 100 ft with Rw with 3 L o2 to maintain >90% sao2.       Balance Overall balance assessment: Needs assistance Sitting-balance support: Feet supported Sitting balance-Leahy Scale: Good     Standing balance support: Bilateral upper extremity supported;Single extremity supported;During functional activity Standing balance-Leahy Scale: Fair Standing balance comment: forward leaning and reliant on walker, but no LOBs      Cognition Arousal/Alertness: Awake/alert Behavior During Therapy: WFL for tasks assessed/performed Overall Cognitive Status: Within Functional Limits for tasks assessed      General Comments: Pt pleasant and motivated. Wants to go home to care for spouse.             Pertinent Vitals/Pain Pain Assessment: 0-10 Faces Pain Scale: Hurts little more Pain Location: R rib pain Pain Descriptors / Indicators: Guarding;Tender;Grimacing Pain Intervention(s): Limited activity within patient's tolerance;Monitored during session;Premedicated before session;Repositioned           PT Goals (current goals can now be found in the care plan section) Acute Rehab PT Goals Patient Stated Goal: go home and take care of my husband Progress towards PT goals: Progressing toward goals    Frequency    Min 2X/week      PT Plan Current plan remains appropriate       AM-PAC PT "6 Clicks" Mobility  Outcome Measure  Help needed turning from your back to your side while in a flat bed without using bedrails?: A Little Help needed moving from lying on your back to sitting on the side of a flat bed without using bedrails?: A Little Help needed moving to and from a bed to a chair (including a wheelchair)?: A Little Help needed standing up from a chair using your arms (e.g., wheelchair or bedside chair)?: A Little Help needed to walk in hospital room?: A  Little Help needed climbing 3-5 steps with a railing? : A Little 6 Click Score: 18    End of Session Equipment Utilized During Treatment: Oxygen;Gait belt Activity Tolerance: Patient tolerated treatment well;Patient limited by fatigue Patient left: in chair;with call bell/phone within reach Nurse Communication: Mobility status PT Visit Diagnosis: Muscle weakness (generalized) (M62.81);Unsteadiness on feet (R26.81);Difficulty in walking, not elsewhere classified (R26.2);Pain Pain - Right/Left: Right     Time: 1000-1024 PT Time Calculation (min) (ACUTE ONLY): 24 min  Charges:  $Gait Training: 8-22 mins $Therapeutic Activity: 8-22 mins                     Julaine Fusi PTA 02/14/21, 12:10 PM

## 2021-02-14 NOTE — TOC Transition Note (Signed)
Transition of Care [facility]) - CM/SW Discharge Note   Patient Details  Name: Belinda Garcia MRN: [id number] Date of Birth: 16-Aug-1951  Transition of Care [facility]) CM/SW Contact:  Pete Pelt, RN Phone Number: 02/14/2021, 12:00 PM   Clinical Narrative:   TOC in to see patient.  Discussed discharge plan with Home Health, patient amenable.  [place] from [place] accepted the patient.  Home oxygen ordered with [employer].  3n1 ordered with [employer].  Will be delivered to room.  Patient son will be here at 4pm to transport her home.  Patient states she has no further TOC concerns at this time.          Patient Goals and CMS Choice        Discharge Placement                       Discharge Plan and Services                                     Social Determinants of Health (SDOH) Interventions     Readmission Risk Interventions No flowsheet data found.

## 2021-02-14 NOTE — Discharge Summary (Signed)
Physician Discharge Summary  Belinda Garcia [id number] DOB: 06-23-1951 DOA: 02/07/2021  PCP: Maryland Pink, MD  Admit date: 02/07/2021 Discharge date: 02/14/2021  Time spent: 40 minutes  Recommendations for Outpatient Follow-up:  1. Needs labs in about 1 week-CBC, Chem-12 2. Outpatient follow-up with Dr. [name] of neurosurgery 3. Suture/staple removal from head on 5/19 4. Patient to follow-up with Dr. Kary Kos with regards to her concerns and questions about Workmen's Compensation  Discharge Diagnoses:  MAIN problem for hospitalization   Small subdural hemorrhage  Please see below for itemized issues addressed in HOpsital- refer to other progress notes for clarity if needed  Discharge Condition: Improved  Diet recommendation: Diabetic heart healthy  Filed Weights   02/07/21 1805  Weight: 86.2 kg    History of present illness:  89 white female community dwelling COPD home oxygen 2 L Fibromyalgia (sees Dr. [name] for longstanding lower back pain) anxiety Hypothyroid HTN HLD reflux Prior PE off anticoagulation since 10 years -Fall was accidental-knee locked up on her before she fell-hit right side of head with questionable LOC-immediate headache without dizziness blurred vision Labs show hypokalemia hyponatremia WBC 16  Imaging = trace anterior right basilar pneumothorax-acute ninth rib anterolateral fracture suspected CT head = left convexity subdural hematoma 5 mm thick no midline shift CT scan head 6 hours subsequently = no interval progression left subdural hematoma  Initial Rx Keppra 500 twice daily, Lidoderm patch  Hospital Course:  5 mm subdural hematoma without midline shift Patient seen by Dr. Cari Caraway of neurosurgery Per them continue Keppra 500 twice daily for 1 week and then stop 1 month follow-up for head CT Remove staples from scalp probably on 5/18 at the latest-I have counseled her to follow-up with her primary care physician Syncope in the setting of?   BPPV Patient relates has had "dizziness"-(characterized by room spinning not really responsive to meclizine) for the past 6 months Physical therapy saw the patient screened her for BPPV and there was no findings of the same This may be more central in origin with her dizziness as she says she has had this for 6 months She also has peripheral neuropathy from her diabetes mellitus Lastly confounder could be orthostasis and I have counseled her clearly to sit up on the side of the bed takes several minutes to get up and to use the bedside commode liberally Right basilar pneumothorax Evaluated by surgery and no further needs per them Continue lidocaine patch Tylenol oxycodone Outpatient follow-up as required COPD chronic respiratory failure on 2 L Patient educated on incentive spirometry and use Stable at this time continue with Robitussin 10 mL every 4 as needed Singulair 10 daily at bedtime Flonase Continue oxygen on discharge Chronic back pain followed by Dr. [name] Continue meds as above She asks me about Workmen's Compensation tells me that she has chronic locking of her back and knee which may have prompted her fall I have explained to her clearly that her lawyer can guide her better than I can and I do not do that type of medicine I will ask that transitions of care personnel follow-up with her to confirm this before she discharges Hypokalemia Continue K. 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 twice daily at home resumed on discharge Sliding scale in the hospital and CBGs were well controlled here  Procedures: Multiple CT  scans of the head Consultations:  Neurosurgery  General surgery  Discharge Exam: Vitals:   02/14/21 0543 02/14/21 0733  BP: (!) 131/54 (!) 141/56  Pulse:  76  Resp:  17  Temp:   (!) 97.4 F (36.3 C)  SpO2:  90%    Subj on day of d/c   Awake coherent no distress feels fair Eating and drinking Mobilized yesterday as per therapy's note and is cleared for discharge home with home health She has to take care of her ailing husband in addition She has no fever or chills She asked me about Workmen's Comp. 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  Will need to come back to the ED for staple removal.  Please call call MD or return to ER for similar or worsening recurring problem that brought you to hospital or if any uncontrolled headache drowsiness/sleepiness, double vision swallowing difficulty focal weakness numbness tingling fever,nausea/vomiting,abdominal pain, uncontrolled pain, chest pain,  shortness of breath or any other alarming symptoms.  Please follow-up your doctor as instructed in a week time and call the office for appointment.  Please avoid alcohol, smoking, or any other illicit substance and maintain healthy habits including taking your regular medications as prescribed.  You were cared for by a hospitalist during your hospital stay. If you have any questions about your discharge medications or the care you received while you were in the hospital after you are discharged, you can call the unit and ask to speak with the hospitalist on call if the hospitalist that took care of you is not available.  Once you  are discharged, your primary care physician will handle any further medical issues. Please note that NO REFILLS for any discharge medications will be authorized once you are discharged, as it is imperative that you return to your primary care physician (or establish a relationship with a primary care physician if you do not have one) for your aftercare needs so that they can reassess your need for medications and monitor your lab values   Discharge instructions   Complete by: As directed    Please follow-up with your primary care physician with regards to your workman comp questions and talk to your lawyer with regards to the same-it is quite unlikely that this will be an active case but your lawyer may be able to guide you better than I can I would recommend that you get your sutures and staples out of your head on the right side in about 5 to 6 days at your primary care physician's office You were prescribed a medication called Keppra to prevent you from having seizures-you can discontinue this after a week  I would recommend you do not drive until you see your primary care physician and demonstrate stability  We will ask physical therapy come out to see you-we will also order equipment for you so that you  have stability at home   Discharge patient   Complete by: As directed    Discharge disposition: 01-Home or Self Care   Discharge patient date: 02/14/2021   Increase activity slowly   Complete by: As directed    Increase activity slowly   Complete by: As directed    Increase activity slowly   Complete by: As directed      Allergies as of 02/14/2021   No Known Allergies     Medication List    STOP taking these medications   meloxicam 15 MG tablet Commonly known as: MOBIC     TAKE these medications   acetaminophen 325 MG tablet Commonly known as: Tylenol Take 2 tablets (650 mg total) by mouth every 4 (four) hours as needed.   ALPRAZolam 0.5 MG tablet Commonly known as: XANAX Take  0.5 mg by mouth at bedtime as needed.   amLODipine 10 MG tablet Commonly known as: NORVASC Take 0.5 tablets (5 mg total) by mouth 2 (two) times daily.   atorvastatin 40 MG tablet Commonly known as: LIPITOR Take 40 mg by mouth at bedtime.   buPROPion 150 MG 12 hr tablet Commonly known as: WELLBUTRIN SR Take 150 mg by mouth every morning.   cholecalciferol 1000 units tablet Commonly known as: VITAMIN D Take 1 tablet (1,000 Units total) by mouth daily.   diazepam 10 MG tablet Commonly known as: VALIUM Take 10 mg by mouth at bedtime as needed for anxiety.   diclofenac sodium 1 % Gel Commonly known as: VOLTAREN Apply 2 g topically 4 (four) times daily.   docusate sodium 100 MG capsule Commonly known as: Colace Take 1 capsule (100 mg total) by mouth 2 (two) times daily for 14 days. Hold for loose stool, multiple Bms.   doxepin 10 MG capsule Commonly known as: SINEQUAN Take 10 mg by mouth at bedtime.   DULoxetine 30 MG capsule Commonly known as: CYMBALTA Take 30 mg by mouth daily.   DULoxetine 60 MG capsule Commonly known as: CYMBALTA Take 60 mg by mouth daily.   esomeprazole 40 MG capsule Commonly known as: NEXIUM Take 40 mg by mouth 2 (two) times daily.   fluticasone 50 MCG/ACT nasal spray Commonly known as: FLONASE Place 2 sprays into both nostrils daily.   glipiZIDE-metformin 2.5-500 MG tablet Commonly known as: METAGLIP Take 1 tablet by mouth 2 (two) times daily before a meal.   hydrochlorothiazide 25 MG tablet Commonly known as: HYDRODIURIL Take 1 tablet by mouth daily.   isosorbide mononitrate 60 MG 24 hr tablet Commonly known as: IMDUR Take 60 mg by mouth daily.   levETIRAcetam 500 MG tablet Commonly known as: Keppra Take 1 tablet (500 mg total) by mouth 2 (two) times daily.   levothyroxine 125 MCG tablet Commonly known as: SYNTHROID Take 125 mcg by mouth daily before breakfast.   lidocaine 5 % Commonly known as: LIDODERM Place 1 patch onto the  skin daily. Remove & Discard patch within 12 hours or as directed by MD   losartan 100 MG tablet Commonly known as: COZAAR Take 1 tablet by mouth daily.   metoprolol succinate 25 MG 24 hr tablet Commonly known as: TOPROL-XL Take 25 mg by mouth daily.   montelukast 10 MG tablet Commonly known as: SINGULAIR Take 10 mg by mouth at bedtime.   nitroGLYCERIN 0.4 MG SL tablet Commonly known as: NITROSTAT Place 0.4 mg under the tongue every 5 (five) minutes as needed for chest pain.   oxyCODONE-acetaminophen 10-325 MG tablet Commonly  known as: PERCOCET Take 1 tablet by mouth every 4 (four) hours as needed for pain.   pregabalin 75 MG capsule Commonly known as: LYRICA Take 75 mg by mouth 2 (two) times daily.            Durable Medical Equipment  (From admission, onward)         Start     Ordered   02/10/21 1201  For home use only DME 3 n 1  Once        02/10/21 1200   Unscheduled  DME 3-in-1  Once        02/14/21 [id number]         No Known Allergies  Follow-up Information    Schedule an appointment as soon as possible for a visit  with Meade Maw, MD.   Specialty: Neurosurgery Contact information: [place] [address] [phone]        Mary[place] Pink, MD Follow up in 1 week(s).   Specialty: Family Medicine Contact information: [address] [place] [address] [phone]                The results of significant diagnostics from this hospitalization (including imaging, microbiology, ancillary and laboratory) are listed below for reference.    Significant Diagnostic Studies: DG Chest 2 View  Result Date: 02/08/2021 CLINICAL DATA:  Pain following fall. EXAM: CHEST - 2 VIEW COMPARISON:  Chest radiograph and rib images; chest CT Feb 07, 2021 FINDINGS: There is no appreciable pneumothorax by radiography. There is atelectatic change in the left mid lung. No edema or consolidation. Heart size and pulmonary  vascularity are normal. No adenopathy. There is aortic atherosclerosis. The apparent right ninth rib fracture seen on rib series not convincingly seen on chest radiographic examination. IMPRESSION: No evident pneumothorax. Atelectatic change left mid lung. No consolidation. Stable cardiac silhouette. Aortic Atherosclerosis (ICD10-I70.0). Electronically Signed   By: Lowella Grip III M.D.   On: 02/08/2021 07:46   DG Ribs Unilateral W/Chest Right  Result Date: 02/07/2021 CLINICAL DATA:  Fall with right chest pain EXAM: RIGHT RIBS AND CHEST - 3+ VIEW COMPARISON:  01/25/2018 FINDINGS: Single-view chest demonstrates no focal opacity or pleural effusion. Normal cardiomediastinal silhouette with aortic atherosclerosis. No pneumothorax. Right rib series demonstrates probable acute right ninth anterolateral rib fracture. IMPRESSION: 1. Negative for pneumothorax 2. Suspected acute right ninth anterolateral rib fracture Electronically Signed   By: Donavan Foil M.D.   On: 02/07/2021 19:05   CT HEAD WO CONTRAST  Result Date: 02/12/2021 CLINICAL DATA:  Recent subdural hematoma EXAM: CT HEAD WITHOUT CONTRAST TECHNIQUE: Contiguous axial images were obtained from the base of the skull through the vertex without intravenous contrast. COMPARISON:  Feb 08, 2021 FINDINGS: Brain: There is again noted a subdural hematoma with increased attenuation fluid in the left superior temporal region with a maximum thickness of 5 mm near the sylvian fissure, unchanged. No progression or new subdural hematoma is appreciated. This subdural hematoma causes modest localized mass effect, stable, without midline shift. No intra-axial hemorrhage noted. No mass. There is mild age related volume loss. There is mild decreased attenuation in the periventricular white matter. No acute infarct is evident. Vascular: No hyperdense vessel. Calcification in each carotid siphon region is stable. Skull: Bony calvarium appears intact.  Stable scalp staples.  Sinuses/Orbits: Air-fluid level left maxillary antrum. Mucosal thickening in left sphenoid sinus and in multiple ethmoid air cells. Small air-fluid levels in each inferior frontal sinus. Orbits  appear symmetric bilaterally except for evidence of previous cataract removal on the left. Other: Mastoid air cells clear. Apparent skin nevus right frontal region. IMPRESSION: Stable left-sided temporal region subdural hematoma measuring 5 mm in maximal thickness. Modest mass effect locally. No appreciable new or progressing extra-axial fluid collection. No intra-axial hemorrhage. There is mild periventricular small vessel disease. No acute infarct evident. No midline shift. Foci of arterial vascular calcification noted. Multiple foci of paranasal sinus disease noted. Electronically Signed   By: Lowella Grip III M.D.   On: 02/12/2021 11:34   CT Head Wo Contrast  Result Date: 02/08/2021 CLINICAL DATA:  Subdural hematoma, 6 hour follow-up EXAM: CT HEAD WITHOUT CONTRAST TECHNIQUE: Contiguous axial images were obtained from the base of the skull through the vertex without intravenous contrast. COMPARISON:  Yesterday FINDINGS: Brain: Known subdural hematoma along the left cerebral convexity measuring up to 5 mm in thickness at the mid sylvian fissure. No evidence of progression or new hemorrhage. No incidental infarct, swelling, or hydrocephalus. Vascular: No hyperdense vessel or unexpected calcification. Skull: Scalp staples right and posteriorly.  No calvarial fracture. Sinuses/Orbits: No acute finding. IMPRESSION: No interval progression of the left subdural hematoma which measures up to 5 mm in thickness. Electronically Signed   By: Monte Fantasia M.D.   On: 02/08/2021 04:13   CT Head Wo Contrast  Result Date: 02/07/2021 CLINICAL DATA:  [place] [address] and hit head with laceration to right side EXAM: CT HEAD WITHOUT CONTRAST CT CERVICAL SPINE WITHOUT CONTRAST TECHNIQUE: Multidetector CT imaging of the head and cervical spine  was performed following the standard protocol without intravenous contrast. Multiplanar CT image reconstructions of the cervical spine were also generated. COMPARISON:  CT 03/24/2016 FINDINGS: CT HEAD FINDINGS Brain: No acute territorial infarction or intracranial mass is visualized. Acute thin left convexity subdural hematoma, thickest overlying the temporoparietal region, hematoma measures up to 5 mm thickness. No measurable midline shift. The ventricles are nonenlarged. Vascular: No hyperdense vessels.  Carotid vascular calcification. Skull: No depressed skull fracture. Sinuses/Orbits: Fluid level left maxillary sinus Other: Small right posterior parietal scalp laceration and hematoma CT CERVICAL SPINE FINDINGS Alignment: Mild reversal of cervical lordosis. No subluxation. Facet alignment is within normal limits. Skull base and vertebrae: No acute fracture. No primary bone lesion or focal pathologic process. Soft tissues and spinal canal: No prevertebral fluid or swelling. No visible canal hematoma. Disc levels: Moderate disc space narrowing and degenerative change C4-C5, C5-C6 and C6-C7. Facet degenerative changes at multiple levels with foraminal stenosis, greatest at C5-C6. Upper chest: Negative.  Right upper rib anomalies. Other: None IMPRESSION: 1. Acute left convexity subdural hematoma measuring up to 5 mm thickness. No midline shift. Small right posterior scalp hematoma and laceration without underlying skull fracture. 2. Mild reversal of cervical lordosis. No acute osseous abnormality. Multilevel degenerative changes Critical Value/emergent results were called by telephone at the time of interpretation on 02/07/2021 at 7:04 pm to provider [facility] , who verbally acknowledged these results. Electronically Signed   By: Donavan Foil M.D.   On: 02/07/2021 19:04   CT Chest Wo Contrast  Result Date: 02/07/2021 CLINICAL DATA:  Fall with right-sided chest pain, rib fracture on chest x-ray EXAM: CT  CHEST WITHOUT CONTRAST TECHNIQUE: Multidetector CT imaging of the chest was performed following the standard protocol without IV contrast. COMPARISON:  02/07/2021, 06/22/2020 FINDINGS: Examination was performed without intravenous contrast, limiting evaluation of the mediastinal and vascular structures in the setting of trauma. Cardiovascular: Unenhanced imaging of the heart and great  vessels demonstrates no pericardial effusion. Normal caliber of the thoracic aorta. Extensive atherosclerosis of the aorta and coronary vasculature. Mediastinum/Nodes: No enlarged mediastinal or axillary lymph nodes. Thyroid gland, trachea, and esophagus demonstrate no significant findings. Lungs/Pleura: There is a trace anterior right pneumothorax at the anterior costophrenic angle, volume estimated far less than 1%. Hypoventilatory changes and scarring are seen at the lung bases. No airspace disease or effusion. Mild upper lobe emphysema. The central airways are patent. Upper Abdomen: No acute abnormality. Musculoskeletal: There is a minimally displaced right posterolateral seventh rib fracture. No other acute bony abnormalities. Reconstructed images demonstrate no additional findings. IMPRESSION: 1. Trace anterior right basilar pneumothorax, volume estimated far less than 1%. 2. Minimally displaced right posterolateral seventh rib fracture. 3. Aortic Atherosclerosis (ICD10-I70.0) and Emphysema (ICD10-J43.9). Critical Value/emergent results were called by telephone at the time of interpretation on 02/07/2021 at 8:30 pm to provider [facility] , who verbally acknowledged these results. Electronically Signed   By: Sharlet SalinaMichael  Brown M.D.   On: 02/07/2021 20:36   CT CERVICAL SPINE WO CONTRAST  Result Date: 02/07/2021 CLINICAL DATA:  Larey SeatFell and hit head with laceration to right side EXAM: CT HEAD WITHOUT CONTRAST CT CERVICAL SPINE WITHOUT CONTRAST TECHNIQUE: Multidetector CT imaging of the head and cervical spine was performed following  the standard protocol without intravenous contrast. Multiplanar CT image reconstructions of the cervical spine were also generated. COMPARISON:  CT 03/24/2016 FINDINGS: CT HEAD FINDINGS Brain: No acute territorial infarction or intracranial mass is visualized. Acute thin left convexity subdural hematoma, thickest overlying the temporoparietal region, hematoma measures up to 5 mm thickness. No measurable midline shift. The ventricles are nonenlarged. Vascular: No hyperdense vessels.  Carotid vascular calcification. Skull: No depressed skull fracture. Sinuses/Orbits: Fluid level left maxillary sinus Other: Small right posterior parietal scalp laceration and hematoma CT CERVICAL SPINE FINDINGS Alignment: Mild reversal of cervical lordosis. No subluxation. Facet alignment is within normal limits. Skull base and vertebrae: No acute fracture. No primary bone lesion or focal pathologic process. Soft tissues and spinal canal: No prevertebral fluid or swelling. No visible canal hematoma. Disc levels: Moderate disc space narrowing and degenerative change C4-C5, C5-C6 and C6-C7. Facet degenerative changes at multiple levels with foraminal stenosis, greatest at C5-C6. Upper chest: Negative.  Right upper rib anomalies. Other: None IMPRESSION: 1. Acute left convexity subdural hematoma measuring up to 5 mm thickness. No midline shift. Small right posterior scalp hematoma and laceration without underlying skull fracture. 2. Mild reversal of cervical lordosis. No acute osseous abnormality. Multilevel degenerative changes Critical Value/emergent results were called by telephone at the time of interpretation on 02/07/2021 at 7:04 pm to provider [facility] , who verbally acknowledged these results. Electronically Signed   By: Jasmine PangKim  Fujinaga M.D.   On: 02/07/2021 19:04    Microbiology: Recent Results (from the past 240 hour(s))  Resp Panel by RT-PCR (Flu A&B, Covid) Nasopharyngeal Swab     Status: None   Collection Time:  02/07/21  7:45 PM   Specimen: Nasopharyngeal Swab; Nasopharyngeal(NP) swabs in vial transport medium  Result Value Ref Range Status   SARS Coronavirus 2 by RT PCR NEGATIVE NEGATIVE Final    Comment: (NOTE) SARS-CoV-2 target nucleic acids are NOT DETECTED.  The SARS-CoV-2 RNA is generally detectable in upper respiratory specimens during the acute phase of infection. The lowest concentration of SARS-CoV-2 viral copies this assay can detect is 138 copies/mL. A negative result does not preclude SARS-Cov-2 infection and should not be used as the sole basis for treatment  or other patient management decisions. A negative result may occur with  improper specimen collection/handling, submission of specimen other than nasopharyngeal swab, presence of viral mutation(s) within the areas targeted by this assay, and inadequate number of viral copies(<138 copies/mL). A negative result must be combined with clinical observations, patient history, and epidemiological information. The expected result is Negative.  Fact Sheet for Patients:  EntrepreneurPulse.com.au  Fact Sheet for Healthcare Providers:  IncredibleEmployment.be  This test is no t yet approved or cleared by the [place] FDA and  has been authorized for detection and/or diagnosis of SARS-CoV-2 by FDA under an Emergency Use Authorization (EUA). This EUA will remain  in effect (meaning this test can be used) for the duration of the COVID-19 declaration under Section 564(b)(1) of the Act, 21 U.S.C.section 360bbb-3(b)(1), unless the authorization is terminated  or revoked sooner.       Influenza A by PCR NEGATIVE NEGATIVE Final   Influenza B by PCR NEGATIVE NEGATIVE Final    Comment: (NOTE) The Xpert Xpress SARS-CoV-2/FLU/RSV plus assay is intended as an aid in the diagnosis of influenza from Nasopharyngeal swab specimens and should not be used as a sole basis for treatment. Nasal washings  and aspirates are unacceptable for Xpert Xpress SARS-CoV-2/FLU/RSV testing.  Fact Sheet for Patients: EntrepreneurPulse.com.au  Fact Sheet for Healthcare Providers: IncredibleEmployment.be  This test is not yet approved or cleared by the [place] FDA and has been authorized for detection and/or diagnosis of SARS-CoV-2 by FDA under an Emergency Use Authorization (EUA). This EUA will remain in effect (meaning this test can be used) for the duration of the COVID-19 declaration under Section 564(b)(1) of the Act, 21 U.S.C. section 360bbb-3(b)(1), unless the authorization is terminated or revoked.  Performed at [facility], [place], [place] [address]   SARS CORONAVIRUS 2 (TAT 6-24 HRS) Nasopharyngeal Nasopharyngeal Swab     Status: None   Collection Time: 02/13/21 12:00 PM   Specimen: Nasopharyngeal Swab  Result Value Ref Range Status   SARS Coronavirus 2 NEGATIVE NEGATIVE Final    Comment: (NOTE) SARS-CoV-2 target nucleic acids are NOT DETECTED.  The SARS-CoV-2 RNA is generally detectable in upper and lower respiratory specimens during the acute phase of infection. Negative results do not preclude SARS-CoV-2 infection, do not rule out co-infections with other pathogens, and should not be used as the sole basis for treatment or other patient management decisions. Negative results must be combined with clinical observations, patient history, and epidemiological information. The expected result is Negative.  Fact Sheet for Patients: SugarRoll.be  Fact Sheet for Healthcare Providers: https://www.woods-mathews.com/  This test is not yet approved or cleared by the [place] FDA and  has been authorized for detection and/or diagnosis of SARS-CoV-2 by FDA under an Emergency Use Authorization (EUA). This EUA will remain  in effect (meaning this test can be used) for the  duration of the COVID-19 declaration under Se ction 564(b)(1) of the Act, 21 U.S.C. section 360bbb-3(b)(1), unless the authorization is terminated or revoked sooner.  Performed at [facility], [place] [address], [place], [place] [address]      Labs: Basic Metabolic Panel: Recent Labs  Lab 02/07/21 1945 02/08/21 0532 02/11/21 0815 02/14/21 0423  NA 129* 134* 134* 136  K 3.3* 4.2 4.2 4.1  CL 93* 96* 96* 96*  CO2 27 29 30 30   GLUCOSE 112* 130* 176* 201*  BUN 11 11 17  30*  CREATININE 0.60 0.65 0.58 0.65  CALCIUM 9.0 8.8*  9.3 9.4   Liver Function Tests: Recent Labs  Lab 02/07/21 1945 02/14/21 0423  AST 23 16  ALT 23 21  ALKPHOS 63 77  BILITOT 0.9 0.8  PROT 6.7 6.4*  ALBUMIN 3.9 3.2*   No results for input(s): LIPASE, AMYLASE in the last 168 hours. No results for input(s): AMMONIA in the last 168 hours. CBC: Recent Labs  Lab 02/07/21 1945 02/08/21 0532 02/11/21 0815  WBC 16.9* 10.9* 12.5*  HGB 13.3 13.5 14.0  HCT 38.9 39.8 43.1  MCV 97.0 99.3 101.2*  PLT 317 310 326   Cardiac Enzymes: No results for input(s): CKTOTAL, CKMB, CKMBINDEX, TROPONINI in the last 168 hours. BNP: BNP (last 3 results) No results for input(s): BNP in the last 8760 hours.  ProBNP (last 3 results) No results for input(s): PROBNP in the last 8760 hours.  CBG: Recent Labs  Lab 02/13/21 0741 02/13/21 1144 02/13/21 1619 02/13/21 2040 02/14/21 0734  GLUCAP 167* 274* 267* 259* 237*       Signed:  Nita Sells MD   [facility] 02/14/2021, 9:39 AM

## 2021-02-14 NOTE — TOC Progression Note (Signed)
Transition of Care [facility]) - Progression Note    Patient Details  Name: Belinda Garcia MRN: [id number] Date of Birth: 06/08/1951  Transition of Care [facility] CM/SW [place], RN Phone Number: 02/14/2021, 10:44 AM  Clinical Narrative:   Late entry note from 02/13/2021: TOC in room to discuss SNF vs home health with patient.  Patient would not speak about discharge until I spoke with her attorney about an ongoing concern about billing Worker's Compensation.  TOC explained that I cannot speak with her attorney about an ongoing case.  Patient repeatedly requested that I contact her attorney.  TOC stated that we cannot consult in an ongoing concern about Workers Compensation.   Patient would not speak to me about discharge, stated she wanted to speak with a manager prior to discharge discussion.  Counsellor notified, state they will speak with patient.  TOC contact information given to patient and son TOC will follow through discharge.         Expected Discharge Plan and Services           Expected Discharge Date: 02/14/21                                     Social Determinants of Health (SDOH) Interventions    Readmission Risk Interventions No flowsheet data found.

## 2021-02-15 ENCOUNTER — Other Ambulatory Visit: Payer: Self-pay | Admitting: Neurosurgery

## 2021-02-15 DIAGNOSIS — S065XAA Traumatic subdural hemorrhage with loss of consciousness status unknown, initial encounter: Secondary | ICD-10-CM

## 2021-03-12 ENCOUNTER — Ambulatory Visit
Admission: RE | Admit: 2021-03-12 | Discharge: 2021-03-12 | Disposition: A | Discharge status: Home / Self Care | Payer: No Typology Code available for payment source | Source: Ambulatory Visit | Attending: Neurosurgery | Admitting: Neurosurgery

## 2021-03-12 ENCOUNTER — Other Ambulatory Visit: Payer: Self-pay

## 2021-03-12 DIAGNOSIS — S065X9A Traumatic subdural hemorrhage with loss of consciousness of unspecified duration, initial encounter: Secondary | ICD-10-CM | POA: Insufficient documentation

## 2021-03-12 DIAGNOSIS — S065XAA Traumatic subdural hemorrhage with loss of consciousness status unknown, initial encounter: Secondary | ICD-10-CM

## 2021-03-14 DIAGNOSIS — G8929 Other chronic pain: Secondary | ICD-10-CM | POA: Diagnosis present

## 2021-06-17 ENCOUNTER — Other Ambulatory Visit: Payer: Self-pay | Admitting: Obstetrics & Gynecology

## 2021-06-17 DIAGNOSIS — Z1231 Encounter for screening mammogram for malignant neoplasm of breast: Secondary | ICD-10-CM

## 2021-06-18 ENCOUNTER — Emergency Department: Payer: No Typology Code available for payment source

## 2021-06-18 ENCOUNTER — Emergency Department
Admission: EM | Admit: 2021-06-18 | Discharge: 2021-06-18 | Disposition: A | Discharge status: Home / Self Care | Payer: No Typology Code available for payment source | Attending: Emergency Medicine | Admitting: Emergency Medicine

## 2021-06-18 DIAGNOSIS — S2231XA Fracture of one rib, right side, initial encounter for closed fracture: Secondary | ICD-10-CM

## 2021-06-18 DIAGNOSIS — Z7952 Long term (current) use of systemic steroids: Secondary | ICD-10-CM | POA: Diagnosis not present

## 2021-06-18 DIAGNOSIS — I10 Essential (primary) hypertension: Secondary | ICD-10-CM | POA: Insufficient documentation

## 2021-06-18 DIAGNOSIS — Z79899 Other long term (current) drug therapy: Secondary | ICD-10-CM | POA: Diagnosis not present

## 2021-06-18 DIAGNOSIS — J449 Chronic obstructive pulmonary disease, unspecified: Secondary | ICD-10-CM | POA: Diagnosis not present

## 2021-06-18 DIAGNOSIS — F17210 Nicotine dependence, cigarettes, uncomplicated: Secondary | ICD-10-CM | POA: Insufficient documentation

## 2021-06-18 DIAGNOSIS — W19XXXA Unspecified fall, initial encounter: Secondary | ICD-10-CM | POA: Insufficient documentation

## 2021-06-18 DIAGNOSIS — E119 Type 2 diabetes mellitus without complications: Secondary | ICD-10-CM | POA: Diagnosis not present

## 2021-06-18 DIAGNOSIS — N390 Urinary tract infection, site not specified: Secondary | ICD-10-CM | POA: Diagnosis not present

## 2021-06-18 DIAGNOSIS — Z7984 Long term (current) use of oral hypoglycemic drugs: Secondary | ICD-10-CM | POA: Diagnosis not present

## 2021-06-18 DIAGNOSIS — S299XXA Unspecified injury of thorax, initial encounter: Secondary | ICD-10-CM | POA: Diagnosis present

## 2021-06-18 DIAGNOSIS — M545 Low back pain, unspecified: Secondary | ICD-10-CM | POA: Diagnosis not present

## 2021-06-18 DIAGNOSIS — J45909 Unspecified asthma, uncomplicated: Secondary | ICD-10-CM | POA: Insufficient documentation

## 2021-06-18 DIAGNOSIS — I6201 Nontraumatic acute subdural hemorrhage: Secondary | ICD-10-CM | POA: Diagnosis not present

## 2021-06-18 LAB — CBC WITH DIFFERENTIAL/PLATELET
Abs Immature Granulocytes: 0.06 10*3/uL (ref 0.00–0.07)
Basophils Absolute: 0.1 10*3/uL (ref 0.0–0.1)
Basophils Relative: 1 %
Eosinophils Absolute: 0.3 10*3/uL (ref 0.0–0.5)
Eosinophils Relative: 2 %
HCT: 43 % (ref 36.0–46.0)
Hemoglobin: 14.8 g/dL (ref 12.0–15.0)
Immature Granulocytes: 0 %
Lymphocytes Relative: 23 %
Lymphs Abs: 3.6 10*3/uL (ref 0.7–4.0)
MCH: 33.3 pg (ref 26.0–34.0)
MCHC: 34.4 g/dL (ref 30.0–36.0)
MCV: 96.8 fL (ref 80.0–100.0)
Monocytes Absolute: 1.4 10*3/uL — ABNORMAL HIGH (ref 0.1–1.0)
Monocytes Relative: 9 %
Neutro Abs: 10.4 10*3/uL — ABNORMAL HIGH (ref 1.7–7.7)
Neutrophils Relative %: 65 %
Platelets: 353 10*3/uL (ref 150–400)
RBC: 4.44 MIL/uL (ref 3.87–5.11)
RDW: 12.6 % (ref 11.5–15.5)
WBC: 15.8 10*3/uL — ABNORMAL HIGH (ref 4.0–10.5)
nRBC: 0 % (ref 0.0–0.2)

## 2021-06-18 LAB — BASIC METABOLIC PANEL
Anion gap: 10 (ref 5–15)
BUN: 17 mg/dL (ref 8–23)
CO2: 29 mmol/L (ref 22–32)
Calcium: 9.1 mg/dL (ref 8.9–10.3)
Chloride: 93 mmol/L — ABNORMAL LOW (ref 98–111)
Creatinine, Ser: 0.91 mg/dL (ref 0.44–1.00)
GFR, Estimated: >60 mL/min (ref >60)
Glucose, Bld: 87 mg/dL (ref 70–99)
Potassium: 3.8 mmol/L (ref 3.5–5.1)
Sodium: 132 mmol/L — ABNORMAL LOW (ref 135–145)

## 2021-06-18 LAB — URINALYSIS, COMPLETE (UACMP) WITH MICROSCOPIC
Bilirubin Urine: NEGATIVE
Glucose, UA: NEGATIVE mg/dL
Hgb urine dipstick: NEGATIVE
Ketones, ur: NEGATIVE mg/dL
Leukocytes,Ua: NEGATIVE
Nitrite: NEGATIVE
Protein, ur: NEGATIVE mg/dL
Specific Gravity, Urine: 1.004 — ABNORMAL LOW (ref 1.005–1.030)
pH: 6 (ref 5.0–8.0)

## 2021-06-18 MED ORDER — MORPHINE SULFATE (PF) 4 MG/ML IV SOLN
4.0000 mg | Freq: Once | INTRAVENOUS | Status: AC
  Administered 2021-06-18: 4 mg via INTRAVENOUS
  Filled 2021-06-18: qty 1

## 2021-06-18 MED ORDER — LIDOCAINE 5 % EX PTCH
1.0000 | MEDICATED_PATCH | CUTANEOUS | Status: DC
  Administered 2021-06-18: 1 via TRANSDERMAL
  Filled 2021-06-18: qty 1

## 2021-06-18 MED ORDER — HALOPERIDOL LACTATE 5 MG/ML IJ SOLN
5.0000 mg | Freq: Once | INTRAMUSCULAR | Status: AC
  Administered 2021-06-18: 5 mg via INTRAVENOUS
  Filled 2021-06-18: qty 1

## 2021-06-18 MED ORDER — LIDOCAINE 5 % EX PTCH: 1.0000 | MEDICATED_PATCH | Freq: Two times a day (BID) | CUTANEOUS | 0 refills | Status: AC

## 2021-06-18 NOTE — ED Notes (Signed)
Spoke with pt's son Ysidro Evert who stated his brother is on the way to pick pt up
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
## 2021-06-18 NOTE — Discharge Instructions (Signed)
Please seek medical attention for any high fevers, chest pain, shortness of breath, change in behavior, persistent vomiting, bloody stool or any other new or concerning symptoms.  

## 2021-06-18 NOTE — ED Notes (Signed)
Patient transported to CT 

## 2021-06-18 NOTE — ED Notes (Signed)
Pure wick placed.

## 2021-06-18 NOTE — ED Triage Notes (Signed)
Pt has chronic back pain and states she has been having multiple falls , today fell at 1000 , pt has 9/10 back pain

## 2021-08-02 ENCOUNTER — Ambulatory Visit
Admission: RE | Admit: 2021-08-02 | Discharge: 2021-08-02 | Disposition: A | Discharge status: Home / Self Care | Payer: Medicare PPO | Source: Ambulatory Visit | Attending: Obstetrics & Gynecology | Admitting: Obstetrics & Gynecology

## 2021-08-02 ENCOUNTER — Other Ambulatory Visit: Payer: Self-pay

## 2021-08-02 DIAGNOSIS — Z1231 Encounter for screening mammogram for malignant neoplasm of breast: Secondary | ICD-10-CM | POA: Diagnosis present

## 2021-08-05 ENCOUNTER — Encounter: Payer: Self-pay | Admitting: Obstetrics & Gynecology

## 2021-10-06 DIAGNOSIS — Z87820 Personal history of traumatic brain injury: Secondary | ICD-10-CM

## 2021-10-06 HISTORY — DX: Personal history of traumatic brain injury: Z87.820

## 2021-11-19 ENCOUNTER — Other Ambulatory Visit: Payer: Self-pay

## 2021-11-19 DIAGNOSIS — F17210 Nicotine dependence, cigarettes, uncomplicated: Secondary | ICD-10-CM

## 2021-11-19 DIAGNOSIS — Z87891 Personal history of nicotine dependence: Secondary | ICD-10-CM

## 2021-12-03 ENCOUNTER — Other Ambulatory Visit: Payer: Self-pay

## 2021-12-03 ENCOUNTER — Ambulatory Visit
Admission: RE | Admit: 2021-12-03 | Discharge: 2021-12-03 | Disposition: A | Discharge status: Home / Self Care | Payer: Medicare PPO | Source: Ambulatory Visit | Attending: Acute Care | Admitting: Acute Care

## 2021-12-03 DIAGNOSIS — F17210 Nicotine dependence, cigarettes, uncomplicated: Secondary | ICD-10-CM

## 2021-12-03 DIAGNOSIS — Z87891 Personal history of nicotine dependence: Secondary | ICD-10-CM | POA: Diagnosis present

## 2021-12-05 ENCOUNTER — Other Ambulatory Visit: Payer: Self-pay | Admitting: Acute Care

## 2021-12-05 DIAGNOSIS — F17210 Nicotine dependence, cigarettes, uncomplicated: Secondary | ICD-10-CM

## 2021-12-05 DIAGNOSIS — Z87891 Personal history of nicotine dependence: Secondary | ICD-10-CM

## 2022-05-09 IMAGING — CT CT CHEST LUNG CANCER SCREENING LOW DOSE W/O CM
2 of 5 series · 15 of 40 positions shown, 18 images · non-contrast
Comparison: CT angio chest 01/25/2018.

CLINICAL DATA: Lung cancer screening. Thirty-five pack-year
history. Current asymptomatic smoker.

EXAM:
CT CHEST WITHOUT CONTRAST LOW-DOSE FOR LUNG CANCER SCREENING
TECHNIQUE: Multidetector CT imaging of the chest was performed following the
standard protocol without IV contrast.

[Series 3: lung 1.00 · axial · 0.78mm/px · z∈[-1193,-865]mm · 12 of 364 slices shown, 15 images]
[im 18/364  mediastinal]
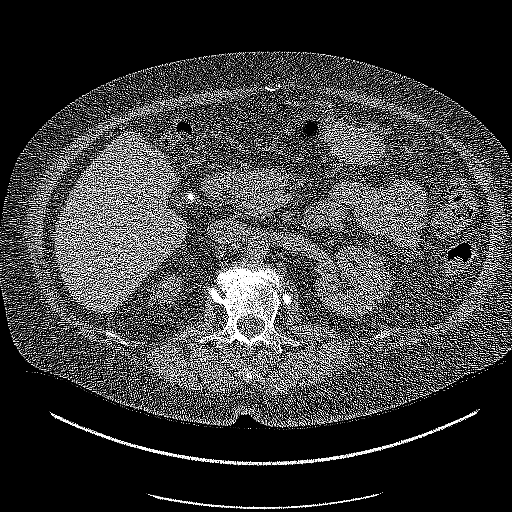
[im 18/364  lung]
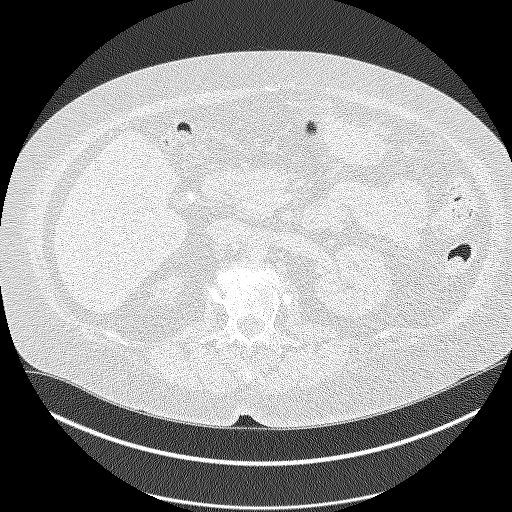
[im 52/364  lung]
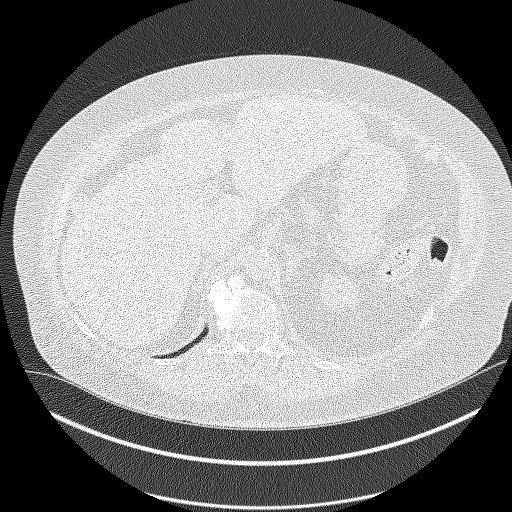
[im 87/364  lung]
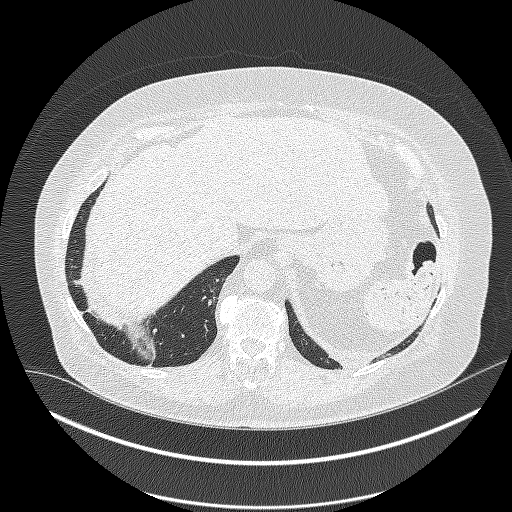
[im 104/364  lung]
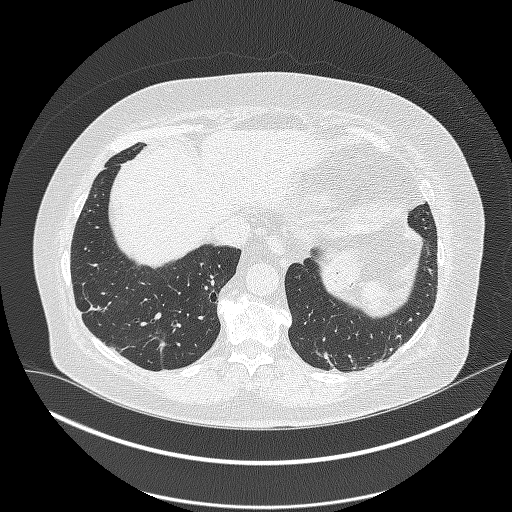
[im 139/364  mediastinal]
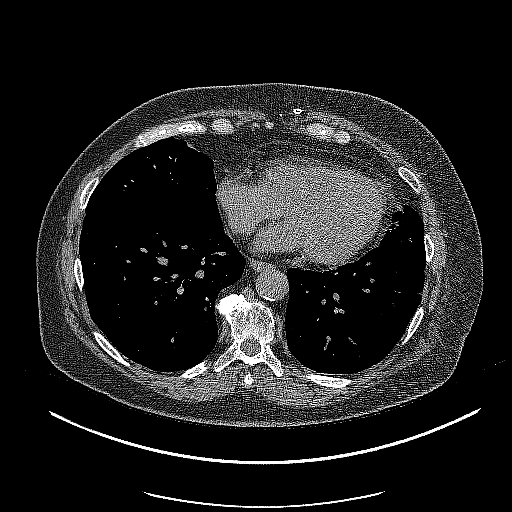
[im 139/364  lung]
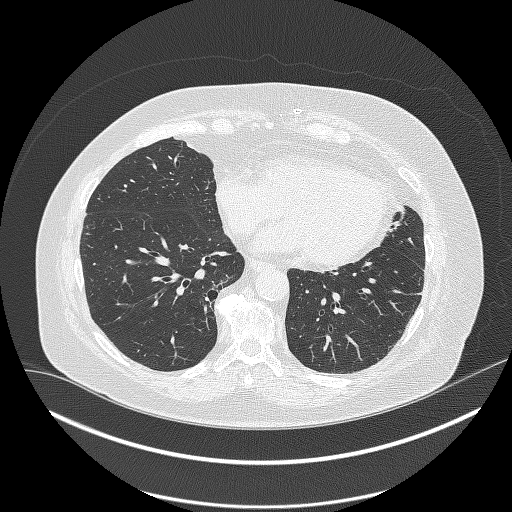
[im 173/364  lung]
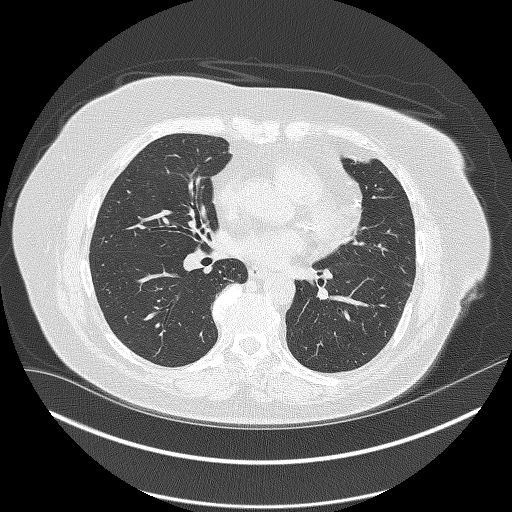
[im 191/364  lung]
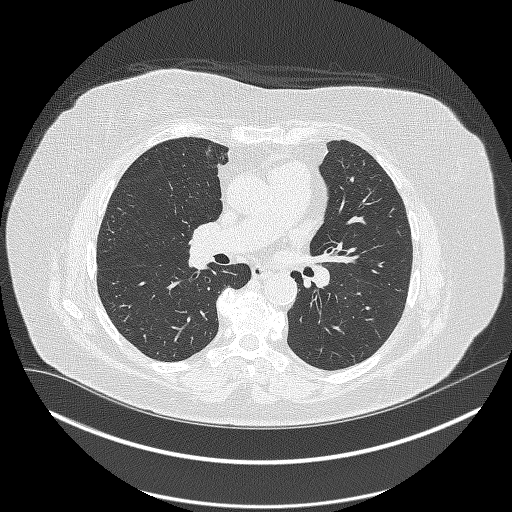
[im 225/364  lung]
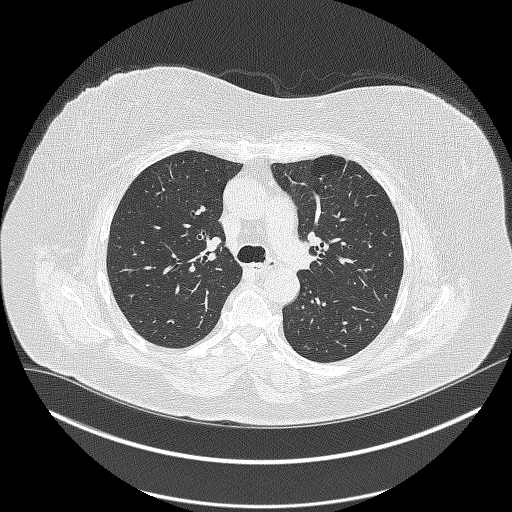
[im 260/364  mediastinal]
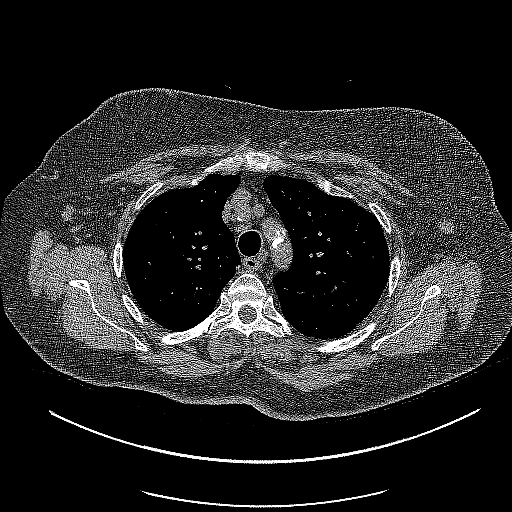
[im 260/364  lung]
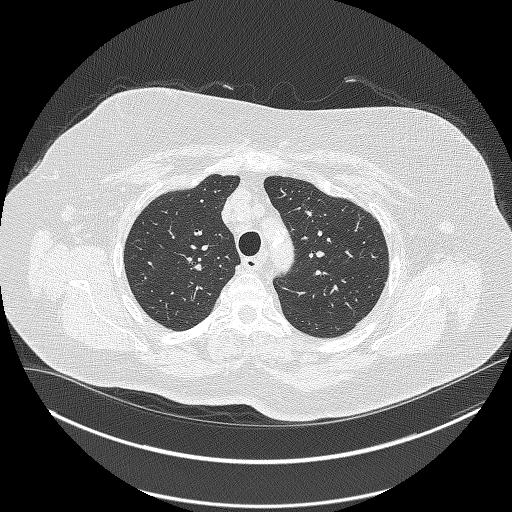
[im 277/364  lung]
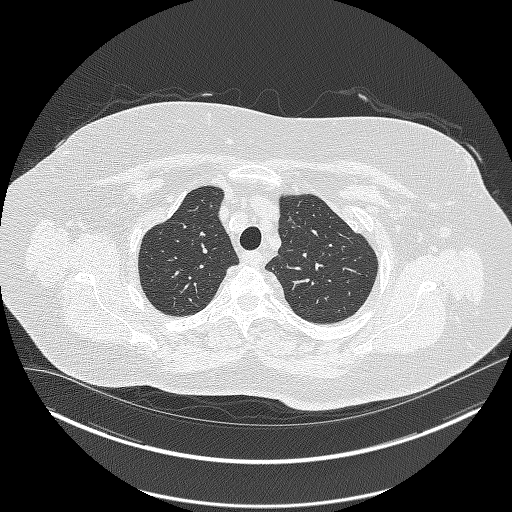
[im 312/364  lung]
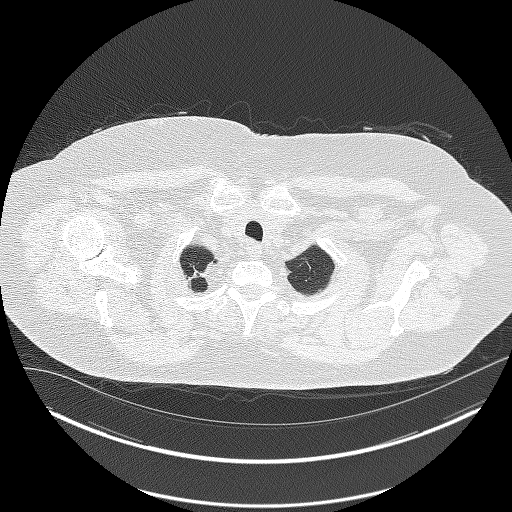
[im 346/364  lung]
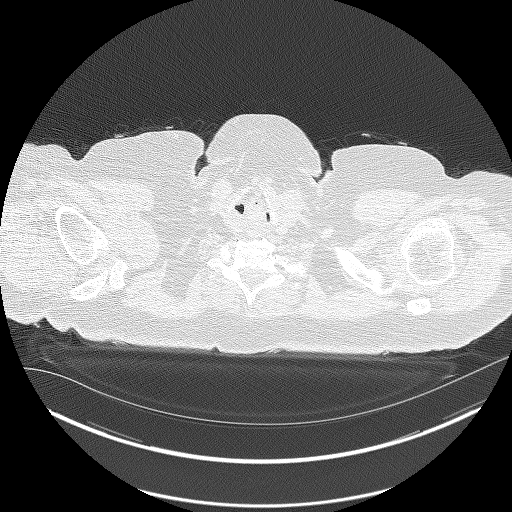

[Series 4: coronals lung 1.00 cor · coronal · 0.71mm/px · 3 of 358 slices shown]
[im 72/358  lung]
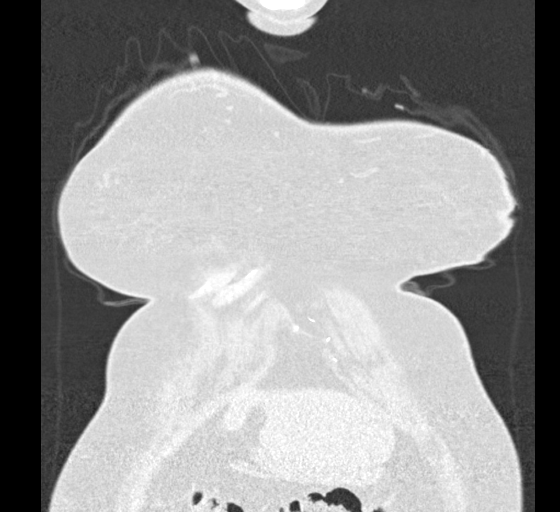
[im 143/358  lung]
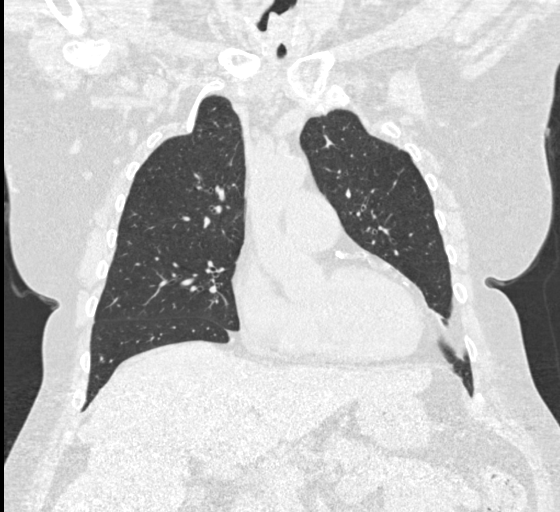
[im 215/358  lung]
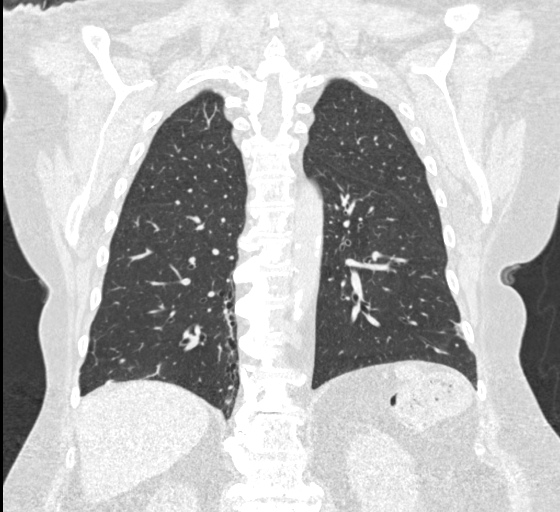

[15 of 40 positions shown; findings below may reference images not displayed]

FINDINGS: Cardiovascular: Heart size is mildly enlarged. Aortic
atherosclerosis and coronary artery calcifications. Main pulmonary
artery measures 3.6 cm consistent with PA hypertension.

Mediastinum/Nodes: Normal appearance of the thyroid gland. The
trachea appears patent and is midline. Normal appearance of the
esophagus.

Lungs/Pleura: Pleuroparenchymal scarring noted in the right lung
apex. Scattered postinflammatory parenchymal bands are noted within
bilateral lower lung zones. Mild centrilobular emphysema. No
suspicious lung nodules.

Upper Abdomen: Normal appearance of the adrenal glands. Previous
cholecystectomy. Calcification within the cystic duct remnant
measures 7 mm, image 70/2. No biliary ductal dilatation. The spleen
appears surgically absent. Scattered soft tissue nodules within the
peritoneal cavity of the upper abdomen are again noted and are
favored to represent regenerative splenic tissue.

Musculoskeletal: Thoracolumbar degenerative disc disease identified.
No acute or suspicious osseous findings.
IMPRESSION: 1. Lung-RADS 1, negative. Continue annual screening with low-dose
chest CT without contrast in 12 months.
2. Coronary artery calcifications.
3. Enlarged main pulmonary artery compatible with PA hypertension.

Aortic Atherosclerosis (I08P6-SOR.R) and Emphysema (I08P6-QUX.2).

## 2022-06-18 ENCOUNTER — Other Ambulatory Visit: Payer: Self-pay | Admitting: Family Medicine

## 2022-06-18 DIAGNOSIS — Z1231 Encounter for screening mammogram for malignant neoplasm of breast: Secondary | ICD-10-CM

## 2022-08-04 ENCOUNTER — Ambulatory Visit
Admission: RE | Admit: 2022-08-04 | Discharge: 2022-08-04 | Disposition: A | Discharge status: Home / Self Care | Payer: Medicare PPO | Source: Ambulatory Visit | Attending: Family Medicine | Admitting: Family Medicine

## 2022-08-04 DIAGNOSIS — Z1231 Encounter for screening mammogram for malignant neoplasm of breast: Secondary | ICD-10-CM | POA: Insufficient documentation

## 2022-08-10 ENCOUNTER — Emergency Department (HOSPITAL_COMMUNITY)
Admission: EM | Admit: 2022-08-10 | Discharge: 2022-08-11 | Disposition: A | Discharge status: Home / Self Care | Payer: Medicare PPO | Attending: Emergency Medicine | Admitting: Emergency Medicine

## 2022-08-10 ENCOUNTER — Emergency Department (HOSPITAL_COMMUNITY): Payer: Medicare PPO

## 2022-08-10 ENCOUNTER — Other Ambulatory Visit: Payer: Self-pay

## 2022-08-10 ENCOUNTER — Encounter (HOSPITAL_COMMUNITY): Payer: Self-pay | Admitting: Emergency Medicine

## 2022-08-10 DIAGNOSIS — E039 Hypothyroidism, unspecified: Secondary | ICD-10-CM | POA: Diagnosis not present

## 2022-08-10 DIAGNOSIS — Z7984 Long term (current) use of oral hypoglycemic drugs: Secondary | ICD-10-CM | POA: Diagnosis not present

## 2022-08-10 DIAGNOSIS — S7002XA Contusion of left hip, initial encounter: Secondary | ICD-10-CM

## 2022-08-10 DIAGNOSIS — E119 Type 2 diabetes mellitus without complications: Secondary | ICD-10-CM | POA: Insufficient documentation

## 2022-08-10 DIAGNOSIS — I10 Essential (primary) hypertension: Secondary | ICD-10-CM | POA: Diagnosis not present

## 2022-08-10 DIAGNOSIS — Z79899 Other long term (current) drug therapy: Secondary | ICD-10-CM | POA: Insufficient documentation

## 2022-08-10 DIAGNOSIS — I6782 Cerebral ischemia: Secondary | ICD-10-CM | POA: Diagnosis not present

## 2022-08-10 DIAGNOSIS — M25552 Pain in left hip: Secondary | ICD-10-CM | POA: Diagnosis present

## 2022-08-10 DIAGNOSIS — W19XXXA Unspecified fall, initial encounter: Secondary | ICD-10-CM | POA: Diagnosis not present

## 2022-08-10 DIAGNOSIS — R296 Repeated falls: Secondary | ICD-10-CM

## 2022-08-10 NOTE — ED Triage Notes (Signed)
Pt bib [facility] after they were alerted multiple times today by pt's electronic medical device that pt had fallen. Pt refused earlier transport but "agreed to come get checked out" after last fall. Per EMS, pt lives alone and uses a cane to assist with ambulation. Ems states pt was ambulatory for them but c/o L hip pain.

## 2022-08-11 ENCOUNTER — Emergency Department (HOSPITAL_COMMUNITY): Payer: Medicare PPO

## 2022-08-11 LAB — URINALYSIS, ROUTINE W REFLEX MICROSCOPIC
Bacteria, UA: NONE SEEN
Bilirubin Urine: NEGATIVE
Glucose, UA: NEGATIVE mg/dL
Ketones, ur: NEGATIVE mg/dL
Leukocytes,Ua: NEGATIVE
Nitrite: NEGATIVE
Protein, ur: NEGATIVE mg/dL
Specific Gravity, Urine: 1.008 (ref 1.005–1.030)
pH: 6 (ref 5.0–8.0)

## 2022-08-11 LAB — CBC WITH DIFFERENTIAL/PLATELET
Abs Immature Granulocytes: 0.03 10*3/uL (ref 0.00–0.07)
Basophils Absolute: 0.1 10*3/uL (ref 0.0–0.1)
Basophils Relative: 1 %
Eosinophils Absolute: 0.9 10*3/uL — ABNORMAL HIGH (ref 0.0–0.5)
Eosinophils Relative: 8 %
HCT: 42.3 % (ref 36.0–46.0)
Hemoglobin: 14 g/dL (ref 12.0–15.0)
Immature Granulocytes: 0 %
Lymphocytes Relative: 42 %
Lymphs Abs: 4.4 10*3/uL — ABNORMAL HIGH (ref 0.7–4.0)
MCH: 32.1 pg (ref 26.0–34.0)
MCHC: 33.1 g/dL (ref 30.0–36.0)
MCV: 97 fL (ref 80.0–100.0)
Monocytes Absolute: 1.1 10*3/uL — ABNORMAL HIGH (ref 0.1–1.0)
Monocytes Relative: 11 %
Neutro Abs: 4.1 10*3/uL (ref 1.7–7.7)
Neutrophils Relative %: 38 %
Platelets: 347 10*3/uL (ref 150–400)
RBC: 4.36 MIL/uL (ref 3.87–5.11)
RDW: 13 % (ref 11.5–15.5)
WBC: 10.6 10*3/uL — ABNORMAL HIGH (ref 4.0–10.5)
nRBC: 0 % (ref 0.0–0.2)

## 2022-08-11 LAB — BASIC METABOLIC PANEL
Anion gap: 9 (ref 5–15)
BUN: 18 mg/dL (ref 8–23)
CO2: 29 mmol/L (ref 22–32)
Calcium: 9.1 mg/dL (ref 8.9–10.3)
Chloride: 95 mmol/L — ABNORMAL LOW (ref 98–111)
Creatinine, Ser: 1.02 mg/dL — ABNORMAL HIGH (ref 0.44–1.00)
GFR, Estimated: 60 mL/min — ABNORMAL LOW (ref >60)
Glucose, Bld: 142 mg/dL — ABNORMAL HIGH (ref 70–99)
Potassium: 3.6 mmol/L (ref 3.5–5.1)
Sodium: 133 mmol/L — ABNORMAL LOW (ref 135–145)

## 2022-08-11 NOTE — ED Notes (Signed)
Pt transported to CT. [facility] Missey Hasley,RN 

## 2022-08-11 NOTE — ED Notes (Signed)
Patient called family friend for a ride at this time. ETA around 8:00-8:30

## 2022-08-11 NOTE — Discharge Instructions (Signed)
Continue medications as previously prescribed.  Follow-up with your primary doctor. 

## 2022-08-11 NOTE — ED Provider Notes (Signed)
[facility] EMERGENCY DEPARTMENT Provider Note   CSN: [id number] Arrival date & time: 08/10/22  2156     History  Chief Complaint  Patient presents with   Belinda Garcia is a 71 y.o. female.  Patient is a 71 year old female with past medical history of traumatic subdural hematoma, hypertension, hyperlipidemia, type 2 diabetes, hypothyroidism.  Patient presenting today with complaints of left hip pain.  She reports falling 5 times today.  She tells me that she has had issues with her equilibrium in the past, but has been more unsteady throughout the day.  She has had EMS come to the house on several occasions after being summoned by her medic alert button.  She tells me she is having pain in the left hip, but denies having struck her head or other injury.  She denies any recent fevers or chills.  She was diagnosed with a UTI within the past month and treated with antibiotics.  Her symptoms for this have resolved.  The history is provided by the patient.       Home Medications Prior to Admission medications   Medication Sig Start Date End Date Taking? Authorizing Provider  ALPRAZolam Duanne Moron) 0.5 MG tablet Take 0.5 mg by mouth at bedtime as needed. 12/02/19   [provider]  amLODipine (NORVASC) 10 MG tablet Take 0.5 tablets (5 mg total) by mouth 2 (two) times daily. 02/09/21   Antonieta Pert, MD  atorvastatin (LIPITOR) 40 MG tablet Take 40 mg by mouth at bedtime.    [provider]  buPROPion (WELLBUTRIN SR) 150 MG 12 hr tablet Take 150 mg by mouth every morning. 01/07/21   [provider]  cholecalciferol (VITAMIN D) 1000 units tablet Take 1 tablet (1,000 Units total) by mouth daily. 02/09/21   Antonieta Pert, MD  diazepam (VALIUM) 10 MG tablet Take 10 mg by mouth at bedtime as needed for anxiety.    [provider]  diclofenac sodium (VOLTAREN) 1 % GEL Apply 2 g topically 4 (four) times daily.    [provider]  doxepin (SINEQUAN) 10 MG  capsule Take 10 mg by mouth at bedtime. 01/07/21   [provider]  DULoxetine (CYMBALTA) 30 MG capsule Take 30 mg by mouth daily. Patient not taking: No sig reported    [provider]  DULoxetine (CYMBALTA) 60 MG capsule Take 60 mg by mouth daily. 01/30/21   [provider]  esomeprazole (NEXIUM) 40 MG capsule Take 40 mg by mouth 2 (two) times daily.    [provider]  fluticasone (FLONASE) 50 MCG/ACT nasal spray Place 2 sprays into both nostrils daily. 01/06/21   [provider]  glipiZIDE-metformin (METAGLIP) 2.5-500 MG tablet Take 1 tablet by mouth 2 (two) times daily before a meal.    [provider]  hydrochlorothiazide (HYDRODIURIL) 25 MG tablet Take 1 tablet by mouth daily. 01/06/21   [provider]  isosorbide mononitrate (IMDUR) 60 MG 24 hr tablet Take 60 mg by mouth daily. 12/05/20   [provider]  levETIRAcetam (KEPPRA) 500 MG tablet Take 1 tablet (500 mg total) by mouth 2 (two) times daily. 02/07/21   Harvest Dark, MD  levothyroxine (SYNTHROID, LEVOTHROID) 125 MCG tablet Take 125 mcg by mouth daily before breakfast.    [provider]  lidocaine (LIDODERM) 5 % Place 1 patch onto the skin daily. Remove & Discard patch within 12 hours or as directed by MD 02/10/21   Antonieta Pert, MD  losartan (  COZAAR) 100 MG tablet Take 1 tablet by mouth daily. 11/19/20   [provider]  metoprolol succinate (TOPROL-XL) 25 MG 24 hr tablet Take 25 mg by mouth daily.    [provider]  montelukast (SINGULAIR) 10 MG tablet Take 10 mg by mouth at bedtime.    [provider]  nitroGLYCERIN (NITROSTAT) 0.4 MG SL tablet Place 0.4 mg under the tongue every 5 (five) minutes as needed for chest pain.    [provider]  oxyCODONE-acetaminophen (PERCOCET) 10-325 MG tablet Take 1 tablet by mouth every 4 (four) hours as needed for pain.    [provider]  pregabalin (LYRICA) 75 MG capsule Take 75  mg by mouth 2 (two) times daily.    [provider]      Allergies    Patient has no known allergies.    Review of Systems   Review of Systems  All other systems reviewed and are negative.   Physical Exam Updated Vital Signs BP (!) 145/68   Pulse 66   Temp 98.1 F (36.7 C) (Oral)   Resp 18   Ht '5\' 6"'$  (1.676 m)   Wt 89 kg   SpO2 96%   BMI 31.67 kg/m  Physical Exam Vitals and nursing note reviewed.  Constitutional:      General: She is not in acute distress.    Appearance: She is well-developed. She is not diaphoretic.  HENT:     Head: Normocephalic and atraumatic.  Cardiovascular:     Rate and Rhythm: Normal rate and regular rhythm.     Heart sounds: No murmur heard.    No friction rub. No gallop.  Pulmonary:     Effort: Pulmonary effort is normal. No respiratory distress.     Breath sounds: Normal breath sounds. No wheezing.  Abdominal:     General: Bowel sounds are normal. There is no distension.     Palpations: Abdomen is soft.     Tenderness: There is no abdominal tenderness.  Musculoskeletal:        General: Normal range of motion.     Cervical back: Normal range of motion and neck supple.     Comments: There is tenderness to palpation over the lateral aspect of the left hip.  She describes pain when she attempts to raise it off of the bed.  DP pulses are palpable bilaterally and motor and sensation are intact throughout both lower extremities.  Skin:    General: Skin is warm and dry.  Neurological:     General: No focal deficit present.     Mental Status: She is alert and oriented to person, place, and time.     ED Results / Procedures / Treatments   Labs (all labs ordered are listed, but only abnormal results are displayed) Labs Reviewed - No data to display  EKG None  Radiology DG Hip Unilat With Pelvis 2-3 Views Left  Result Date: 08/10/2022 CLINICAL DATA:  Status post fall. EXAM: DG HIP (WITH OR WITHOUT PELVIS) 2-3V LEFT COMPARISON:   None Available. FINDINGS: There is no evidence of an acute hip fracture or dislocation. Mild degenerative changes are seen involving the bilateral hips in the form of joint space narrowing and acetabular sclerosis. Marked severity degenerative changes are seen within the visualized portion of the lower lumbar spine. IMPRESSION: Degenerative changes involving the bilateral hips. Electronically Signed   By: Virgina Norfolk M.D.   On: 08/10/2022 22:39    Procedures Procedures    Medications  Ordered in ED Medications - No data to display  ED Course/ Medical Decision Making/ A&P  Patient is a 71 year old female presenting with complaints of fall.  She reports having equilibrium issues and has fallen multiple times today.  She is describing pain in her left hip, but denies other injuries.  She denies other symptoms.  Patient arrives here with stable vital signs.  She is afebrile and oxygen saturations are normal.  Physical examination reveals tenderness to the lateral aspect of the left hip, but no other significant findings.  She appears neurologically intact and heart and lung examination is normal.  Work-up initiated including CBC, metabolic panel, and CT scan of the head to further evaluate for causes of instability.  These were all unremarkable.  Urinalysis also obtained as she had a recent UTI, but urine does not reflect infection.  She also had plain x-rays of her left hip which were negative.  This was followed up with a CT scan which also showed no evidence for fracture.  At this point, I see no indication for emergent hospitalization and feel as though patient can safely be discharged.  She feels as though she is okay to go home and will have family members assist her.  Final Clinical Impression(s) / ED Diagnoses Final diagnoses:  None    Rx / DC Orders ED Discharge Orders     None         Veryl Speak, MD 08/11/22 [phone]

## 2022-12-03 ENCOUNTER — Ambulatory Visit
Admission: RE | Admit: 2022-12-03 | Discharge: 2022-12-03 | Disposition: A | Discharge status: Home / Self Care | Payer: Medicare PPO | Source: Ambulatory Visit | Attending: Acute Care | Admitting: Acute Care

## 2022-12-03 DIAGNOSIS — I700 Atherosclerosis of aorta: Secondary | ICD-10-CM | POA: Diagnosis not present

## 2022-12-03 DIAGNOSIS — J439 Emphysema, unspecified: Secondary | ICD-10-CM | POA: Diagnosis not present

## 2022-12-03 DIAGNOSIS — Z87891 Personal history of nicotine dependence: Secondary | ICD-10-CM | POA: Insufficient documentation

## 2022-12-03 DIAGNOSIS — Z122 Encounter for screening for malignant neoplasm of respiratory organs: Secondary | ICD-10-CM | POA: Insufficient documentation

## 2022-12-03 DIAGNOSIS — F17210 Nicotine dependence, cigarettes, uncomplicated: Secondary | ICD-10-CM

## 2022-12-05 ENCOUNTER — Other Ambulatory Visit: Payer: Self-pay | Admitting: Acute Care

## 2022-12-05 DIAGNOSIS — Z87891 Personal history of nicotine dependence: Secondary | ICD-10-CM

## 2022-12-05 DIAGNOSIS — Z122 Encounter for screening for malignant neoplasm of respiratory organs: Secondary | ICD-10-CM

## 2022-12-30 IMAGING — CT CT HEAD W/O CM
3 series · 15 of 47 positions shown, 18 images · non-contrast
Comparison: February 08, 2021

CLINICAL DATA: Recent subdural hematoma

EXAM:
CT HEAD WITHOUT CONTRAST
TECHNIQUE: Contiguous axial images were obtained from the base of the skull
through the vertex without intravenous contrast.

[Series 2: head wo · axial · 0.43mm/px · z∈[+1002,+1127]mm · 9 of 31 slices shown, 12 images]
[im 3/31  brain]
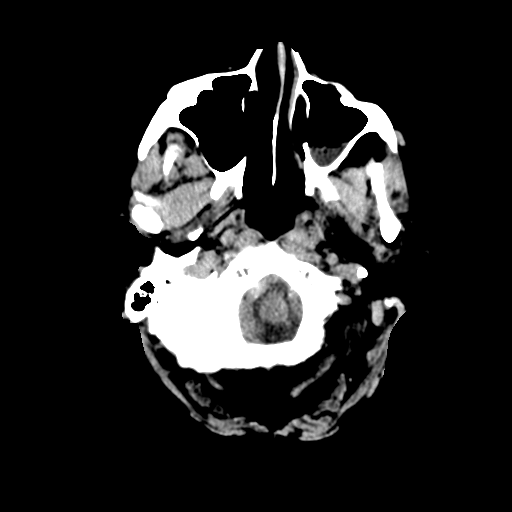
[im 3/31  bone]
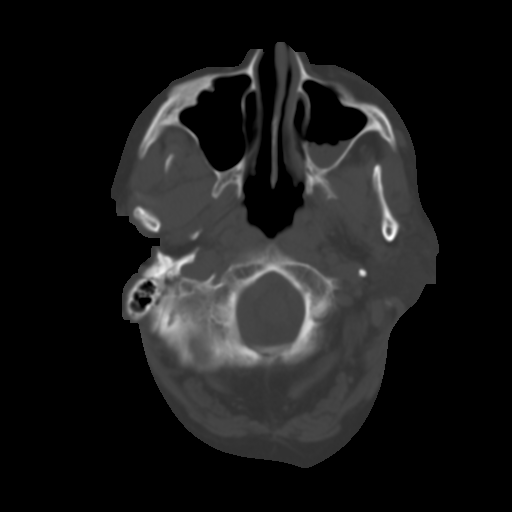
[im 6/31  brain]
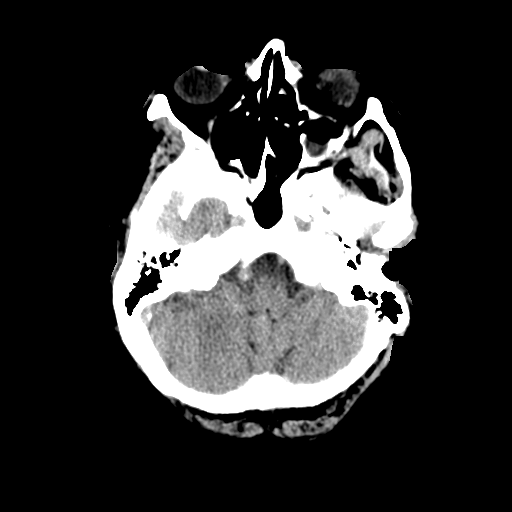
[im 9/31  brain]
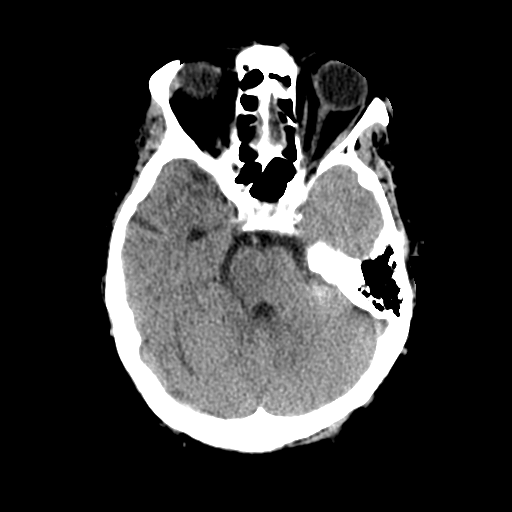
[im 12/31  brain]
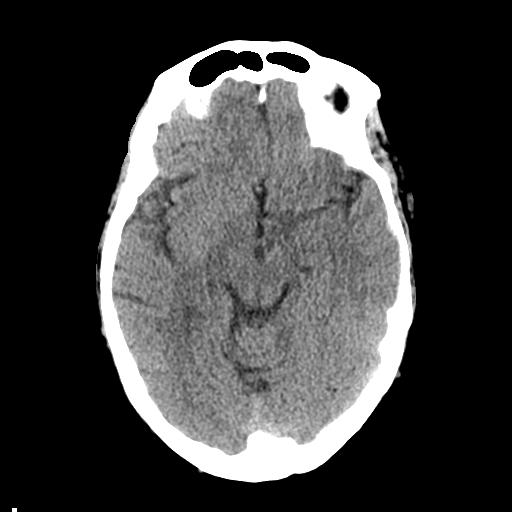
[im 16/31  brain]
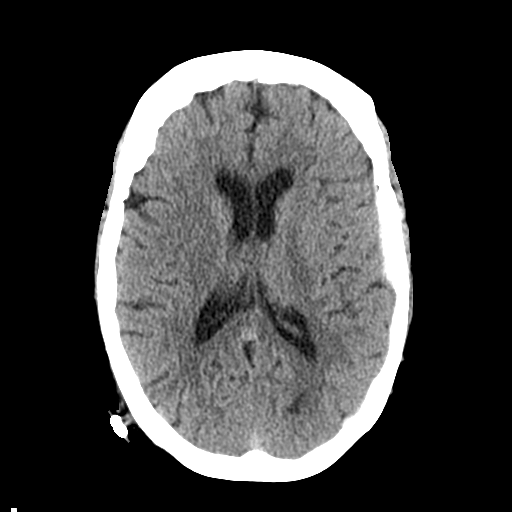
[im 16/31  bone]
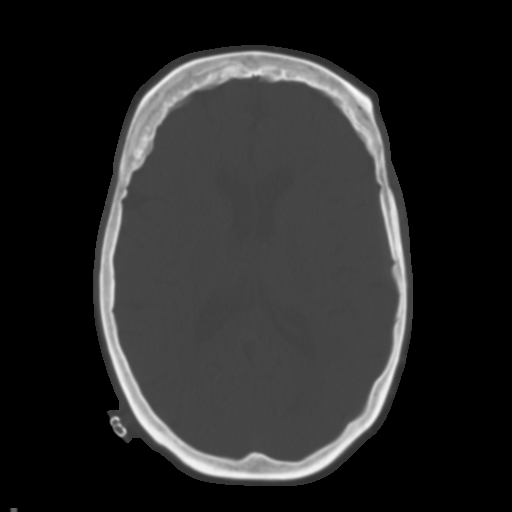
[im 19/31  brain]
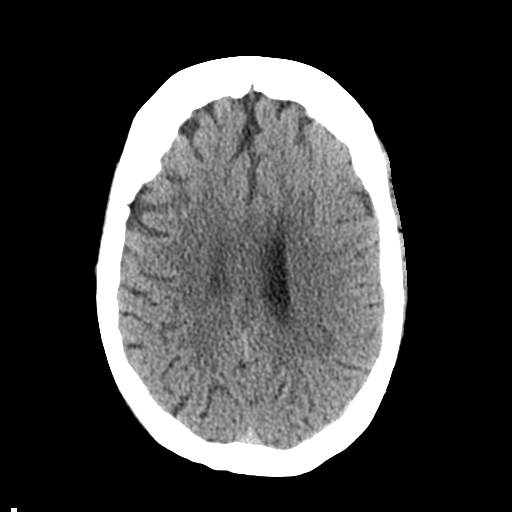
[im 22/31  brain]
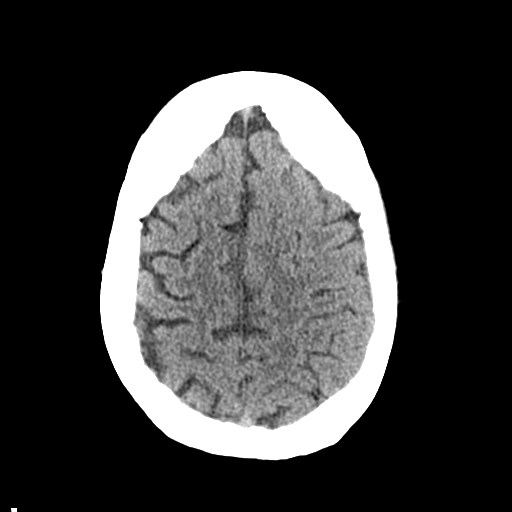
[im 25/31  brain]
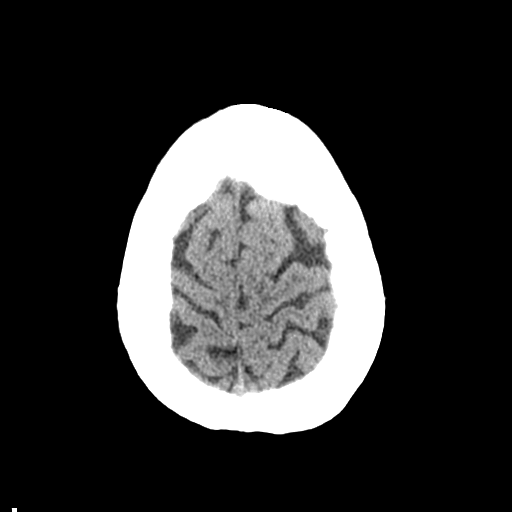
[im 28/31  brain]
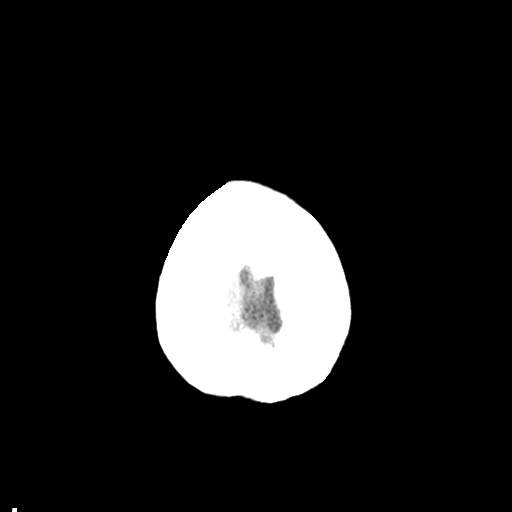
[im 28/31  bone]
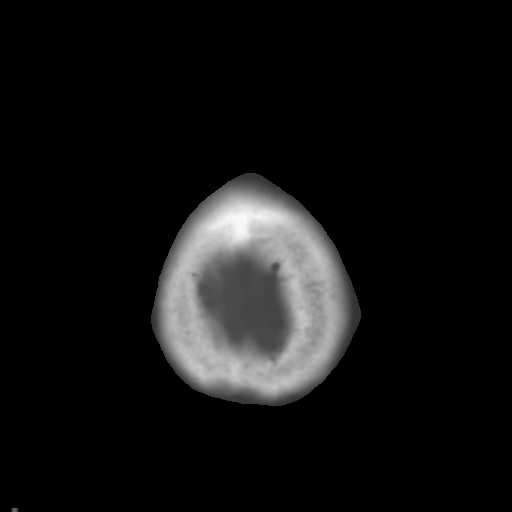

[Series 4: coronal soft tissue · coronal · 0.32mm/px · 3 of 68 slices shown]
[im 23/68  brain]
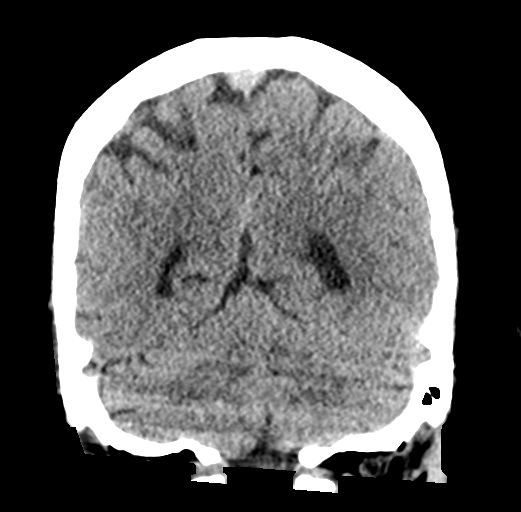
[im 30/68  brain]
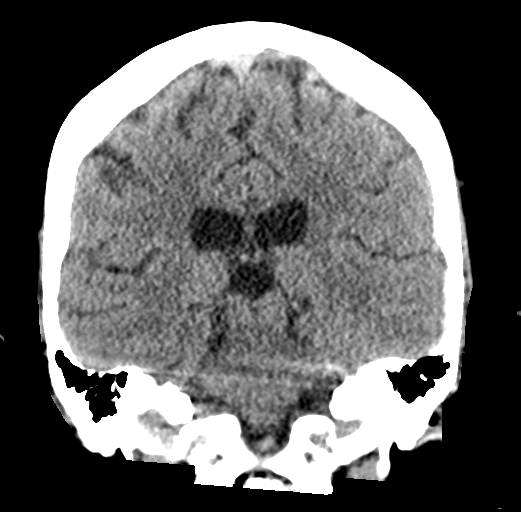
[im 38/68  brain]
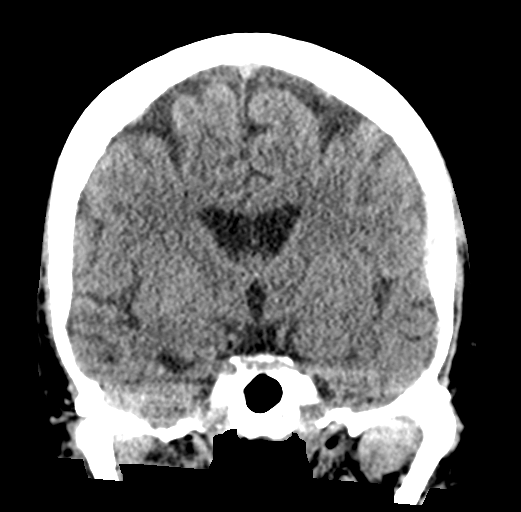

[Series 5: sagittal soft tissue · sagittal · 0.33mm/px · 3 of 49 slices shown]
[im 17/49  brain]
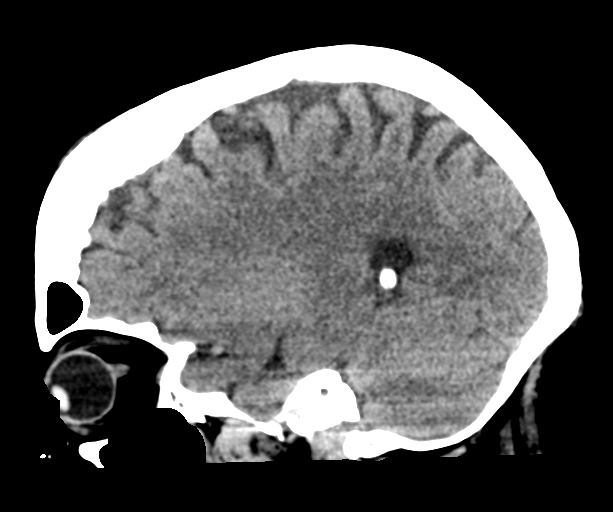
[im 25/49  brain]
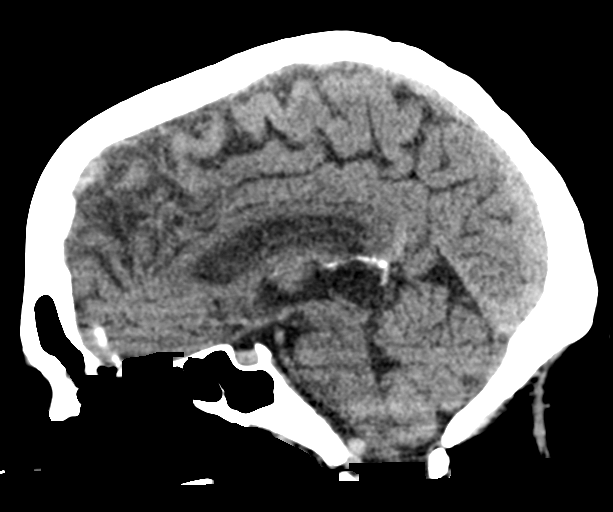
[im 33/49  brain]
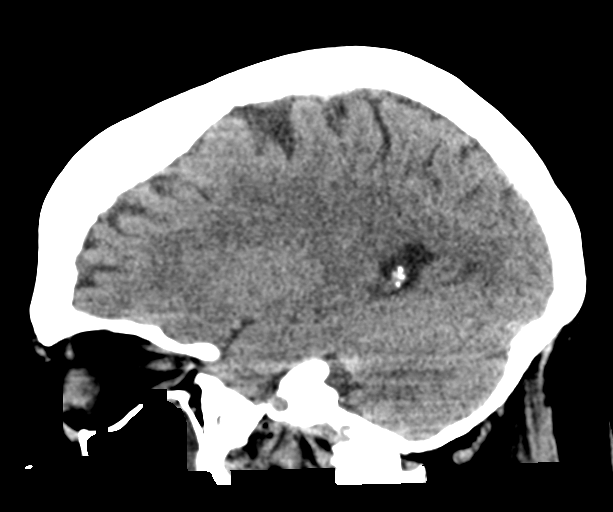

[15 of 47 positions shown; findings below may reference images not displayed]

FINDINGS: Brain: There is again noted a subdural hematoma with increased
attenuation fluid in the left superior temporal region with a
maximum thickness of 5 mm near the sylvian fissure, unchanged. No
progression or new subdural hematoma is appreciated. This subdural
hematoma causes modest localized mass effect, stable, without
midline shift. No intra-axial hemorrhage noted. No mass. There is
mild age related volume loss. There is mild decreased attenuation in
the periventricular white matter. No acute infarct is evident.

Vascular: No hyperdense vessel. Calcification in each carotid siphon
region is stable.

Skull: Bony calvarium appears intact.  Stable scalp staples.

Sinuses/Orbits: Air-fluid level left maxillary antrum. Mucosal
thickening in left sphenoid sinus and in multiple ethmoid air cells.
Small air-fluid levels in each inferior frontal sinus. Orbits appear
symmetric bilaterally except for evidence of previous cataract
removal on the left.

Other: Mastoid air cells clear. Apparent skin nevus right frontal
region.
IMPRESSION: Stable left-sided temporal region subdural hematoma measuring 5 mm
in maximal thickness. Modest mass effect locally. No appreciable new
or progressing extra-axial fluid collection. No intra-axial
hemorrhage. There is mild periventricular small vessel disease. No
acute infarct evident. No midline shift.

Foci of arterial vascular calcification noted. Multiple foci of
paranasal sinus disease noted.

## 2023-03-24 ENCOUNTER — Ambulatory Visit: Admit: 2023-03-24 | Payer: Medicare PPO | Admitting: Ophthalmology

## 2023-03-24 SURGERY — PHACOEMULSIFICATION, CATARACT, WITH IOL INSERTION
Anesthesia: Topical | Laterality: Right

## 2023-06-18 ENCOUNTER — Encounter: Payer: Self-pay | Admitting: Ophthalmology

## 2023-06-26 NOTE — Discharge Instructions (Signed)
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
## 2023-06-30 ENCOUNTER — Ambulatory Visit: Payer: Medicare PPO | Admitting: Anesthesiology

## 2023-06-30 ENCOUNTER — Encounter
Admission: RE | Disposition: A | Discharge status: Home / Self Care | Payer: Self-pay | Source: Home / Self Care | Attending: Ophthalmology

## 2023-06-30 ENCOUNTER — Ambulatory Visit
Admission: RE | Admit: 2023-06-30 | Discharge: 2023-06-30 | Disposition: A | Discharge status: Home / Self Care | Payer: Medicare PPO | Attending: Ophthalmology | Admitting: Ophthalmology

## 2023-06-30 ENCOUNTER — Encounter: Payer: Self-pay | Admitting: Ophthalmology

## 2023-06-30 ENCOUNTER — Other Ambulatory Visit: Payer: Self-pay

## 2023-06-30 DIAGNOSIS — Z7951 Long term (current) use of inhaled steroids: Secondary | ICD-10-CM | POA: Diagnosis not present

## 2023-06-30 DIAGNOSIS — K219 Gastro-esophageal reflux disease without esophagitis: Secondary | ICD-10-CM | POA: Insufficient documentation

## 2023-06-30 DIAGNOSIS — E1136 Type 2 diabetes mellitus with diabetic cataract: Secondary | ICD-10-CM | POA: Diagnosis not present

## 2023-06-30 DIAGNOSIS — Z86711 Personal history of pulmonary embolism: Secondary | ICD-10-CM | POA: Diagnosis not present

## 2023-06-30 DIAGNOSIS — J4489 Other specified chronic obstructive pulmonary disease: Secondary | ICD-10-CM | POA: Diagnosis not present

## 2023-06-30 DIAGNOSIS — M1990 Unspecified osteoarthritis, unspecified site: Secondary | ICD-10-CM | POA: Insufficient documentation

## 2023-06-30 DIAGNOSIS — E079 Disorder of thyroid, unspecified: Secondary | ICD-10-CM | POA: Diagnosis not present

## 2023-06-30 DIAGNOSIS — H2511 Age-related nuclear cataract, right eye: Secondary | ICD-10-CM | POA: Insufficient documentation

## 2023-06-30 DIAGNOSIS — M797 Fibromyalgia: Secondary | ICD-10-CM | POA: Diagnosis not present

## 2023-06-30 DIAGNOSIS — F17210 Nicotine dependence, cigarettes, uncomplicated: Secondary | ICD-10-CM | POA: Insufficient documentation

## 2023-06-30 DIAGNOSIS — I10 Essential (primary) hypertension: Secondary | ICD-10-CM | POA: Insufficient documentation

## 2023-06-30 DIAGNOSIS — Z7984 Long term (current) use of oral hypoglycemic drugs: Secondary | ICD-10-CM | POA: Diagnosis not present

## 2023-06-30 DIAGNOSIS — Z79890 Hormone replacement therapy (postmenopausal): Secondary | ICD-10-CM | POA: Insufficient documentation

## 2023-06-30 DIAGNOSIS — G4730 Sleep apnea, unspecified: Secondary | ICD-10-CM | POA: Insufficient documentation

## 2023-06-30 HISTORY — DX: Angina pectoris, unspecified: I20.9

## 2023-06-30 HISTORY — PX: CATARACT EXTRACTION W/PHACO: SHX586

## 2023-06-30 HISTORY — DX: Weakness: R53.1

## 2023-06-30 HISTORY — DX: Other chronic pain: G89.29

## 2023-06-30 HISTORY — DX: Gastro-esophageal reflux disease with esophagitis, without bleeding: K21.00

## 2023-06-30 HISTORY — DX: Dizziness and giddiness: R42

## 2023-06-30 HISTORY — DX: Low back pain, unspecified: M54.50

## 2023-06-30 HISTORY — DX: Encounter for other preprocedural examination: Z01.818

## 2023-06-30 HISTORY — DX: Major depressive disorder, recurrent, moderate: F33.1

## 2023-06-30 HISTORY — DX: Other specified personal risk factors, not elsewhere classified: Z91.89

## 2023-06-30 LAB — GLUCOSE, CAPILLARY: Glucose-Capillary: 164 mg/dL — ABNORMAL HIGH (ref 70–99)

## 2023-06-30 SURGERY — PHACOEMULSIFICATION, CATARACT, WITH IOL INSERTION
Anesthesia: Monitor Anesthesia Care | Site: Eye | Laterality: Right

## 2023-06-30 MED ORDER — SIGHTPATH DOSE#1 BSS IO SOLN
INTRAOCULAR | Status: DC | PRN
  Administered 2023-06-30: 2 mL

## 2023-06-30 MED ORDER — MIDAZOLAM HCL 2 MG/2ML IJ SOLN
INTRAMUSCULAR | Status: DC | PRN
  Administered 2023-06-30 (×2): 1 mg via INTRAVENOUS

## 2023-06-30 MED ORDER — MOXIFLOXACIN HCL 0.5 % OP SOLN
OPHTHALMIC | Status: DC | PRN
  Administered 2023-06-30: .2 mL via OPHTHALMIC

## 2023-06-30 MED ORDER — FENTANYL CITRATE (PF) 100 MCG/2ML IJ SOLN
INTRAMUSCULAR | Status: DC | PRN
  Administered 2023-06-30 (×2): 50 ug via INTRAVENOUS

## 2023-06-30 MED ORDER — TETRACAINE HCL 0.5 % OP SOLN
1.0000 [drp] | OPHTHALMIC | Status: DC | PRN
  Administered 2023-06-30 (×3): 1 [drp] via OPHTHALMIC

## 2023-06-30 MED ORDER — SIGHTPATH DOSE#1 BSS IO SOLN
INTRAOCULAR | Status: DC | PRN
  Administered 2023-06-30: 15 mL via INTRAOCULAR

## 2023-06-30 MED ORDER — TETRACAINE HCL 0.5 % OP SOLN
OPHTHALMIC | Status: AC
  Filled 2023-06-30: qty 4

## 2023-06-30 MED ORDER — BRIMONIDINE TARTRATE-TIMOLOL 0.2-0.5 % OP SOLN
OPHTHALMIC | Status: DC | PRN
  Administered 2023-06-30: 1 [drp] via OPHTHALMIC

## 2023-06-30 MED ORDER — FENTANYL CITRATE (PF) 100 MCG/2ML IJ SOLN
INTRAMUSCULAR | Status: AC
  Filled 2023-06-30: qty 2

## 2023-06-30 MED ORDER — MIDAZOLAM HCL 2 MG/2ML IJ SOLN
INTRAMUSCULAR | Status: AC
  Filled 2023-06-30: qty 2

## 2023-06-30 MED ORDER — ARMC OPHTHALMIC DILATING DROPS
1.0000 | OPHTHALMIC | Status: DC | PRN
  Administered 2023-06-30 (×3): 1 via OPHTHALMIC

## 2023-06-30 MED ORDER — LACTATED RINGERS IV SOLN: INTRAVENOUS | Status: DC

## 2023-06-30 MED ORDER — SIGHTPATH DOSE#1 NA CHONDROIT SULF-NA HYALURON 40-17 MG/ML IO SOLN
INTRAOCULAR | Status: DC | PRN
  Administered 2023-06-30: 1 mL via INTRAOCULAR

## 2023-06-30 MED ORDER — SIGHTPATH DOSE#1 BSS IO SOLN
INTRAOCULAR | Status: DC | PRN
  Administered 2023-06-30: 103 mL via OPHTHALMIC

## 2023-06-30 SURGICAL SUPPLY — 17 items
ANGLE REVERSE CUT SHRT 25GA (CUTTER) ×1
CANNULA ANT/CHMB 27G (MISCELLANEOUS) IMPLANT
CANNULA ANT/CHMB 27GA (MISCELLANEOUS)
CATARACT SUITE SIGHTPATH (MISCELLANEOUS) ×1
CYSTOTOME ANGL RVRS SHRT 25G (CUTTER) ×1 IMPLANT
FEE CATARACT SUITE SIGHTPATH (MISCELLANEOUS) ×1 IMPLANT
GLOVE BIOGEL PI IND STRL 8 (GLOVE) ×1 IMPLANT
GLOVE SURG LX STRL 8.0 MICRO (GLOVE) ×1 IMPLANT
LENS CLARION WAGON WHEEL 18.5 (Intraocular Lens) ×1 IMPLANT
LENS IOL CLRN WGN WHL 18.5 (Intraocular Lens) IMPLANT
NDL FILTER BLUNT 18X1 1/2 (NEEDLE) ×1 IMPLANT
NEEDLE FILTER BLUNT 18X1 1/2 (NEEDLE) ×1
PACK VIT ANT 23G (MISCELLANEOUS) IMPLANT
RING MALYGIN (MISCELLANEOUS) IMPLANT
SUT ETHILON 10-0 CS-B-6CS-B-6 (SUTURE)
SUTURE EHLN 10-0 CS-B-6CS-B-6 (SUTURE) IMPLANT
SYR 3ML LL SCALE MARK (SYRINGE) ×1 IMPLANT

## 2023-06-30 NOTE — Transfer of Care (Signed)
Immediate Anesthesia Transfer of Care Note  Patient: Belinda Garcia  Procedure(s) Performed: CATARACT EXTRACTION PHACO AND INTRAOCULAR LENS PLACEMENT (IOC) RIGHT DIABETIC  35.53 .2:56.2 (Right: Eye)  Patient Location: PACU  Anesthesia Type: MAC  Level of Consciousness: awake, alert  and patient cooperative  Airway and Oxygen Therapy: Patient Spontanous Breathing and Patient connected to supplemental oxygen  Post-op Assessment: Post-op Vital signs reviewed, Patient's Cardiovascular Status Stable, Respiratory Function Stable, Patent Airway and No signs of Nausea or vomiting  Post-op Vital Signs: Reviewed and stable  Complications: No notable events documented.

## 2023-06-30 NOTE — Op Note (Signed)
PREOPERATIVE DIAGNOSIS:  Nuclear sclerotic cataract of the right eye.   POSTOPERATIVE DIAGNOSIS:  Cataract   OPERATIVE PROCEDURE:ORPROCALL@   SURGEON:  Belinda Manila, MD.   ANESTHESIA:  Anesthesiologist: Marisue Humble, MD CRNA: Barbette Hair, CRNA  1.      Managed anesthesia care. 2.      0.51ml of Shugarcaine was instilled in the eye following the paracentesis.   COMPLICATIONS:  None.   TECHNIQUE:   Stop and chop   DESCRIPTION OF PROCEDURE:  The patient was examined and consented in the preoperative holding area where the aforementioned topical anesthesia was applied to the right eye and then brought back to the Operating Room where the right eye was prepped and draped in the usual sterile ophthalmic fashion and a lid speculum was placed. A paracentesis was created with the side port blade and the anterior chamber was filled with viscoelastic. A near clear corneal incision was performed with the steel keratome. A continuous curvilinear capsulorrhexis was performed with a cystotome followed by the capsulorrhexis forceps. Hydrodissection and hydrodelineation were carried out with BSS on a blunt cannula. The lens was removed in a stop and chop  technique and the remaining cortical material was removed with the irrigation-aspiration handpiece. The capsular bag was inflated with viscoelastic and the Technis [id number]  lens was placed in the capsular bag without complication. The remaining viscoelastic was removed from the eye with the irrigation-aspiration handpiece. The wounds were hydrated. The anterior chamber was flushed with BSS and the eye was inflated to physiologic pressure. 0.33ml of Vigamox was placed in the anterior chamber. The wounds were found to be water tight. The eye was dressed with Combigan. The patient was given protective glasses to wear throughout the day and a shield with which to sleep tonight. The patient was also given drops with which to begin a drop regimen today and will  follow-up with me in one day. Implant Name Type Inv. Item Serial No. Manufacturer Lot No. LRB No. Used Action  LENS CLARION WAGON WHEEL 18.5 - [id number] Intraocular Lens LENS CLARION WAGON WHEEL 18.5 [id number] [employer]  Right 1 Implanted   Procedure(s): CATARACT EXTRACTION PHACO AND INTRAOCULAR LENS PLACEMENT (IOC) RIGHT DIABETIC  35.53 .2:56.2 (Right)  Electronically signed: Galen Garcia 06/30/2023 10:40 AM
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
## 2023-06-30 NOTE — Anesthesia Postprocedure Evaluation (Signed)
Anesthesia Post Note  Patient: Belinda Garcia  Procedure(s) Performed: CATARACT EXTRACTION PHACO AND INTRAOCULAR LENS PLACEMENT (IOC) RIGHT DIABETIC  35.53 .2:56.2 (Right: Eye)  Patient location during evaluation: PACU Anesthesia Type: MAC Level of consciousness: awake and alert Pain management: pain level controlled Vital Signs Assessment: post-procedure vital signs reviewed and stable Respiratory status: spontaneous breathing, nonlabored ventilation, respiratory function stable and patient connected to nasal cannula oxygen Cardiovascular status: stable and blood pressure returned to baseline Postop Assessment: no apparent nausea or vomiting Anesthetic complications: no   No notable events documented.   Last Vitals:  Vitals:   06/30/23 1042 06/30/23 1045  BP:  (!) 150/85  Pulse: 81 82  Resp: 14 18  Temp:    SpO2: 97% 94%    Last Pain:  Vitals:   06/30/23 1045  TempSrc:   PainSc: 0-No pain                 Hesham Womac C Abhiram Criado

## 2023-07-01 ENCOUNTER — Encounter: Payer: Self-pay | Admitting: Ophthalmology

## 2023-08-03 ENCOUNTER — Other Ambulatory Visit: Payer: Self-pay | Admitting: Family Medicine

## 2023-08-03 DIAGNOSIS — Z1231 Encounter for screening mammogram for malignant neoplasm of breast: Secondary | ICD-10-CM

## 2023-08-11 ENCOUNTER — Encounter: Payer: Self-pay | Admitting: *Deleted

## 2023-08-19 ENCOUNTER — Ambulatory Visit
Admission: RE | Admit: 2023-08-19 | Discharge: 2023-08-19 | Disposition: A | Discharge status: Home / Self Care | Payer: Medicare PPO | Source: Ambulatory Visit | Attending: Family Medicine | Admitting: Family Medicine

## 2023-08-19 DIAGNOSIS — Z1231 Encounter for screening mammogram for malignant neoplasm of breast: Secondary | ICD-10-CM | POA: Diagnosis present

## 2023-08-24 ENCOUNTER — Encounter: Admission: RE | Payer: Self-pay | Source: Home / Self Care

## 2023-08-24 ENCOUNTER — Ambulatory Visit: Admission: RE | Admit: 2023-08-24 | Payer: Medicare PPO | Source: Home / Self Care

## 2023-08-24 HISTORY — DX: Fatty (change of) liver, not elsewhere classified: K76.0

## 2023-08-24 HISTORY — DX: Radiculopathy, cervical region: M54.12

## 2023-08-24 HISTORY — DX: Fracture of one rib, unspecified side, initial encounter for closed fracture: S22.39XA

## 2023-08-24 HISTORY — DX: Other instability, right knee: M25.361

## 2023-08-24 HISTORY — DX: Repeated falls: R29.6

## 2023-08-24 HISTORY — DX: Suicide attempt, initial encounter: T14.91XA

## 2023-08-24 HISTORY — DX: Traumatic subdural hemorrhage with loss of consciousness status unknown, initial encounter: S06.5XAA

## 2023-08-24 HISTORY — DX: Spondylolisthesis, site unspecified: M43.10

## 2023-08-24 SURGERY — COLONOSCOPY WITH PROPOFOL
Anesthesia: General

## 2023-11-09 ENCOUNTER — Encounter: Payer: Self-pay | Admitting: Emergency Medicine

## 2023-11-09 ENCOUNTER — Emergency Department: Payer: Medicare PPO

## 2023-11-09 ENCOUNTER — Other Ambulatory Visit: Payer: Self-pay

## 2023-11-09 ENCOUNTER — Emergency Department
Admission: EM | Admit: 2023-11-09 | Discharge: 2023-11-09 | Disposition: A | Discharge status: Home / Self Care | Payer: Medicare PPO | Attending: Emergency Medicine | Admitting: Emergency Medicine

## 2023-11-09 DIAGNOSIS — R079 Chest pain, unspecified: Secondary | ICD-10-CM | POA: Insufficient documentation

## 2023-11-09 DIAGNOSIS — I6782 Cerebral ischemia: Secondary | ICD-10-CM | POA: Diagnosis not present

## 2023-11-09 DIAGNOSIS — E785 Hyperlipidemia, unspecified: Secondary | ICD-10-CM | POA: Insufficient documentation

## 2023-11-09 DIAGNOSIS — I700 Atherosclerosis of aorta: Secondary | ICD-10-CM | POA: Insufficient documentation

## 2023-11-09 DIAGNOSIS — R059 Cough, unspecified: Secondary | ICD-10-CM | POA: Insufficient documentation

## 2023-11-09 DIAGNOSIS — R42 Dizziness and giddiness: Secondary | ICD-10-CM | POA: Diagnosis present

## 2023-11-09 DIAGNOSIS — J449 Chronic obstructive pulmonary disease, unspecified: Secondary | ICD-10-CM | POA: Diagnosis not present

## 2023-11-09 DIAGNOSIS — R202 Paresthesia of skin: Secondary | ICD-10-CM | POA: Diagnosis not present

## 2023-11-09 DIAGNOSIS — R0602 Shortness of breath: Secondary | ICD-10-CM | POA: Diagnosis not present

## 2023-11-09 DIAGNOSIS — I10 Essential (primary) hypertension: Secondary | ICD-10-CM | POA: Diagnosis not present

## 2023-11-09 DIAGNOSIS — Z86711 Personal history of pulmonary embolism: Secondary | ICD-10-CM | POA: Insufficient documentation

## 2023-11-09 DIAGNOSIS — K449 Diaphragmatic hernia without obstruction or gangrene: Secondary | ICD-10-CM | POA: Insufficient documentation

## 2023-11-09 DIAGNOSIS — E119 Type 2 diabetes mellitus without complications: Secondary | ICD-10-CM | POA: Diagnosis not present

## 2023-11-09 DIAGNOSIS — Z20822 Contact with and (suspected) exposure to covid-19: Secondary | ICD-10-CM | POA: Insufficient documentation

## 2023-11-09 DIAGNOSIS — R200 Anesthesia of skin: Secondary | ICD-10-CM | POA: Insufficient documentation

## 2023-11-09 LAB — BASIC METABOLIC PANEL
Anion gap: 13 (ref 5–15)
BUN: 17 mg/dL (ref 8–23)
CO2: 26 mmol/L (ref 22–32)
Calcium: 9.1 mg/dL (ref 8.9–10.3)
Chloride: 93 mmol/L — ABNORMAL LOW (ref 98–111)
Creatinine, Ser: 0.77 mg/dL (ref 0.44–1.00)
GFR, Estimated: >60 mL/min (ref >60)
Glucose, Bld: 125 mg/dL — ABNORMAL HIGH (ref 70–99)
Potassium: 3.9 mmol/L (ref 3.5–5.1)
Sodium: 132 mmol/L — ABNORMAL LOW (ref 135–145)

## 2023-11-09 LAB — CBC
HCT: 47.7 % — ABNORMAL HIGH (ref 36.0–46.0)
Hemoglobin: 15.9 g/dL — ABNORMAL HIGH (ref 12.0–15.0)
MCH: 32.9 pg (ref 26.0–34.0)
MCHC: 33.3 g/dL (ref 30.0–36.0)
MCV: 98.6 fL (ref 80.0–100.0)
Platelets: 377 10*3/uL (ref 150–400)
RBC: 4.84 MIL/uL (ref 3.87–5.11)
RDW: 12.6 % (ref 11.5–15.5)
WBC: 10.7 10*3/uL — ABNORMAL HIGH (ref 4.0–10.5)
nRBC: 0 % (ref 0.0–0.2)

## 2023-11-09 LAB — CBG MONITORING, ED: Glucose-Capillary: 96 mg/dL (ref 70–99)

## 2023-11-09 LAB — RESP PANEL BY RT-PCR (RSV, FLU A&B, COVID)  RVPGX2
Influenza A by PCR: NEGATIVE
Influenza B by PCR: NEGATIVE
Resp Syncytial Virus by PCR: NEGATIVE
SARS Coronavirus 2 by RT PCR: NEGATIVE

## 2023-11-09 LAB — TROPONIN I (HIGH SENSITIVITY): Troponin I (High Sensitivity): 6 ng/L (ref <18)

## 2023-11-09 NOTE — ED Notes (Signed)
See triage note  Presents with cough and some dizziness   States she started feeling bad couple of weeks ago  Afebrile on arrival

## 2023-11-09 NOTE — ED Triage Notes (Addendum)
C?o chest pain and dizziness x 2 weeks.  SEnt from [facility] for ED evaluation  States symptoms initially started as a productive cough, but now cough is non productive.  Also c/o tingling feeling of left side of body. All sympotms have been ongoing x 2 weeks.

## 2023-11-09 NOTE — ED Provider Triage Note (Signed)
Emergency Medicine Provider Triage Evaluation Note  MILAYAH KRELL , a 73 y.o. female  was evaluated in triage.  Pt complains of cough, congestion, cp, numbness left side of body, sent by kc.  Review of Systems  Positive:  Negative:   Physical Exam  BP (!) 162/91 (BP Location: Left Arm)   Pulse 73   Temp 97.8 F (36.6 C) (Oral)   Resp 16   Wt 87.1 kg   SpO2 91%   BMI 31.95 kg/m  Gen:   Awake, no distress   Resp:  Normal effort  MSK:   Moves extremities without difficulty  Other:    Medical Decision Making  Medically screening exam initiated at 1:09 PM.  Appropriate orders placed.  Chyrl Civatte was informed that the remainder of the evaluation will be completed by another provider, this initial triage assessment does not replace that evaluation, and the importance of remaining in the ED until their evaluation is complete.     Faythe Ghee, PA-C 11/09/23 1309

## 2023-11-09 NOTE — ED Provider Notes (Signed)
[facility] Provider Note    Event Date/Time   First MD Initiated Contact with Patient 11/09/23 1608     (approximate)   History   Chief Complaint Dizziness   HPI  Belinda Garcia is a 73 y.o. female with past medical history of hypertension, hyperlipidemia, diabetes, PE, and COPD who presents to the ED complaining of dizziness.  Patient reports that she has had about 3 weeks of sharp discomfort in her chest when she coughs along with mild difficulty breathing.  Cough has been productive of brownish sputum, but she denies any fevers.  She has not had any pain or swelling in her legs, does state that chest pain and shortness of breath have been improving over the past few days.  Today, she is primarily concerned about dizziness as well as numbness and tingling in her left arm and left leg.  She states this has been going on for about 1 week but denies any weakness in the arm or leg.  She has not noticed any vision changes, speech changes, or facial droop.     Physical Exam   Triage Vital Signs: ED Triage Vitals  Encounter Vitals Group     BP 11/09/23 1305 (!) 162/91     Systolic BP Percentile --      Diastolic BP Percentile --      Pulse Rate 11/09/23 1305 73     Resp 11/09/23 1305 16     Temp 11/09/23 1305 97.8 F (36.6 C)     Temp Source 11/09/23 1305 Oral     SpO2 11/09/23 1305 91 %     Weight 11/09/23 1302 192 lb 0.3 oz (87.1 kg)     Height 11/09/23 1616 5\' 5"  (1.651 m)     Head Circumference --      Peak Flow --      Pain Score 11/09/23 1301 8     Pain Loc --      Pain Education --      Exclude from Growth Chart --     Most recent vital signs: Vitals:   11/09/23 1305 11/09/23 1718  BP: (!) 162/91 (!) 158/88  Pulse: 73 70  Resp: 16 16  Temp: 97.8 F (36.6 C) 98 F (36.7 C)  SpO2: 91% 93%    Constitutional: Alert and oriented. Eyes: Conjunctivae are normal. Head: Atraumatic. Nose: No congestion/rhinnorhea. Mouth/Throat: Mucous  membranes are moist.  Cardiovascular: Normal rate, regular rhythm. Grossly normal heart sounds.  2+ radial pulses bilaterally. Respiratory: Normal respiratory effort.  No retractions. Lungs CTAB. Gastrointestinal: Soft and nontender. No distention. Musculoskeletal: No lower extremity tenderness nor edema.  Neurologic:  Normal speech and language. No gross focal neurologic deficits are appreciated.    ED Results / Procedures / Treatments   Labs (all labs ordered are listed, but only abnormal results are displayed) Labs Reviewed  BASIC METABOLIC PANEL - Abnormal; Notable for the following components:      Result Value   Sodium 132 (*)    Chloride 93 (*)    Glucose, Bld 125 (*)    All other components within normal limits  CBC - Abnormal; Notable for the following components:   WBC 10.7 (*)    Hemoglobin 15.9 (*)    HCT 47.7 (*)    All other components within normal limits  RESP PANEL BY RT-PCR (RSV, FLU A&B, COVID)  RVPGX2  CBG MONITORING, ED  TROPONIN I (HIGH SENSITIVITY)     EKG  ED  ECG REPORT I, Chesley Noon, the attending physician, personally viewed and interpreted this ECG.   Date: 11/09/2023  EKG Time: 13:11  Rate: 73  Rhythm: normal sinus rhythm  Axis: Normal  Intervals:none  ST&T Change: None  RADIOLOGY Chest x-ray reviewed and interpreted by me with no infiltrate, edema, or effusion.  PROCEDURES:  Critical Care performed: No  Procedures   MEDICATIONS ORDERED IN ED: Medications - No data to display   IMPRESSION / MDM / ASSESSMENT AND PLAN / ED COURSE  I reviewed the triage vital signs and the nursing notes.                              73 y.o. female yeah with past medical history of hypertension, hyperlipidemia, diabetes, COPD, and pulmonary embolism who presents to the ED complaining of chest pain and shortness of breath for 3 weeks now improving, today with dizziness, lightheadedness, and left-sided numbness that has been ongoing for a  week.  Patient's presentation is most consistent with acute presentation with potential threat to life or bodily function.  Differential diagnosis includes, but is not limited to, ACS, PE, pneumonia, pneumothorax, musculoskeletal pain, bronchitis, influenza, COVID-19, anemia, electrolyte abnormality, AKI, stroke, TIA, neuropathy.  Patient nontoxic-appearing and in no acute distress, vital signs are unremarkable.  EKG shows no evidence arrhythmia or ischemia and troponin within normal limits, symptoms seem atypical for ACS and more likely to be related to infectious source such as bronchitis.  Chest x-ray is unremarkable, COVID and flu testing is pending.  With improving symptoms over 3 weeks, doubt ACS, PE, or dissection.  Remainder of labs are reassuring with no significant anemia, leukocytosis, electrolyte abnormality, or AKI.  She has no objective weakness on exam and CT head is negative for acute process.  We will further assess with MRI to rule out stroke given her dizziness and left-sided numbness.  MRI brain is negative for acute process, suspect symptoms are due to a peripheral neuropathy and chest pain workup has been unremarkable.  Patient appropriate for discharge home with outpatient follow-up, was counseled to return to the ED for new or worsening symptoms.  Patient agrees with plan.      FINAL CLINICAL IMPRESSION(S) / ED DIAGNOSES   Final diagnoses:  Numbness and tingling  Lightheadedness     Rx / DC Orders   ED Discharge Orders     None        Note:  This document was prepared using Dragon voice recognition software and may include unintentional dictation errors.   Chesley Noon, MD 11/09/23 2044

## 2023-11-13 ENCOUNTER — Other Ambulatory Visit (HOSPITAL_COMMUNITY): Payer: Self-pay

## 2023-11-20 ENCOUNTER — Other Ambulatory Visit: Payer: Self-pay

## 2023-11-20 ENCOUNTER — Encounter: Payer: Self-pay | Admitting: *Deleted

## 2023-11-20 ENCOUNTER — Ambulatory Visit: Payer: Medicare PPO | Admitting: Anesthesiology

## 2023-11-20 ENCOUNTER — Ambulatory Visit
Admission: RE | Admit: 2023-11-20 | Discharge: 2023-11-20 | Disposition: A | Discharge status: Home / Self Care | Payer: Medicare PPO | Attending: Gastroenterology | Admitting: Gastroenterology

## 2023-11-20 ENCOUNTER — Encounter
Admission: RE | Disposition: A | Discharge status: Home / Self Care | Payer: Self-pay | Source: Home / Self Care | Attending: Gastroenterology

## 2023-11-20 DIAGNOSIS — E119 Type 2 diabetes mellitus without complications: Secondary | ICD-10-CM | POA: Diagnosis not present

## 2023-11-20 DIAGNOSIS — R1310 Dysphagia, unspecified: Secondary | ICD-10-CM | POA: Insufficient documentation

## 2023-11-20 DIAGNOSIS — K219 Gastro-esophageal reflux disease without esophagitis: Secondary | ICD-10-CM | POA: Diagnosis not present

## 2023-11-20 DIAGNOSIS — Z7984 Long term (current) use of oral hypoglycemic drugs: Secondary | ICD-10-CM | POA: Diagnosis not present

## 2023-11-20 DIAGNOSIS — Z9081 Acquired absence of spleen: Secondary | ICD-10-CM | POA: Insufficient documentation

## 2023-11-20 DIAGNOSIS — K449 Diaphragmatic hernia without obstruction or gangrene: Secondary | ICD-10-CM | POA: Insufficient documentation

## 2023-11-20 DIAGNOSIS — K760 Fatty (change of) liver, not elsewhere classified: Secondary | ICD-10-CM | POA: Insufficient documentation

## 2023-11-20 DIAGNOSIS — I10 Essential (primary) hypertension: Secondary | ICD-10-CM | POA: Insufficient documentation

## 2023-11-20 DIAGNOSIS — F419 Anxiety disorder, unspecified: Secondary | ICD-10-CM | POA: Insufficient documentation

## 2023-11-20 DIAGNOSIS — Z90710 Acquired absence of both cervix and uterus: Secondary | ICD-10-CM | POA: Diagnosis not present

## 2023-11-20 DIAGNOSIS — K640 First degree hemorrhoids: Secondary | ICD-10-CM | POA: Diagnosis not present

## 2023-11-20 DIAGNOSIS — Z1211 Encounter for screening for malignant neoplasm of colon: Secondary | ICD-10-CM | POA: Diagnosis present

## 2023-11-20 DIAGNOSIS — F339 Major depressive disorder, recurrent, unspecified: Secondary | ICD-10-CM | POA: Insufficient documentation

## 2023-11-20 DIAGNOSIS — F17200 Nicotine dependence, unspecified, uncomplicated: Secondary | ICD-10-CM | POA: Diagnosis not present

## 2023-11-20 DIAGNOSIS — D175 Benign lipomatous neoplasm of intra-abdominal organs: Secondary | ICD-10-CM | POA: Insufficient documentation

## 2023-11-20 DIAGNOSIS — Z860101 Personal history of adenomatous and serrated colon polyps: Secondary | ICD-10-CM | POA: Diagnosis not present

## 2023-11-20 DIAGNOSIS — M797 Fibromyalgia: Secondary | ICD-10-CM | POA: Insufficient documentation

## 2023-11-20 DIAGNOSIS — I209 Angina pectoris, unspecified: Secondary | ICD-10-CM | POA: Insufficient documentation

## 2023-11-20 DIAGNOSIS — J4489 Other specified chronic obstructive pulmonary disease: Secondary | ICD-10-CM | POA: Diagnosis not present

## 2023-11-20 DIAGNOSIS — E039 Hypothyroidism, unspecified: Secondary | ICD-10-CM | POA: Insufficient documentation

## 2023-11-20 DIAGNOSIS — Z7982 Long term (current) use of aspirin: Secondary | ICD-10-CM | POA: Insufficient documentation

## 2023-11-20 HISTORY — PX: COLONOSCOPY WITH PROPOFOL: SHX5780

## 2023-11-20 HISTORY — PX: ESOPHAGOGASTRODUODENOSCOPY (EGD) WITH PROPOFOL: SHX5813

## 2023-11-20 LAB — GLUCOSE, CAPILLARY: Glucose-Capillary: 171 mg/dL — ABNORMAL HIGH (ref 70–99)

## 2023-11-20 SURGERY — COLONOSCOPY WITH PROPOFOL
Anesthesia: General

## 2023-11-20 MED ORDER — PROPOFOL 10 MG/ML IV BOLUS
INTRAVENOUS | Status: AC
  Filled 2023-11-20: qty 20

## 2023-11-20 MED ORDER — PROPOFOL 10 MG/ML IV BOLUS
INTRAVENOUS | Status: DC | PRN
  Administered 2023-11-20 (×2): 20 mg via INTRAVENOUS
  Administered 2023-11-20 (×2): 30 mg via INTRAVENOUS
  Administered 2023-11-20: 50 mg via INTRAVENOUS
  Administered 2023-11-20: 20 mg via INTRAVENOUS
  Administered 2023-11-20: 100 mg via INTRAVENOUS

## 2023-11-20 MED ORDER — LIDOCAINE HCL (PF) 2 % IJ SOLN
INTRAMUSCULAR | Status: AC
Start: 2023-11-20 — End: ?
  Filled 2023-11-20: qty 5

## 2023-11-20 MED ORDER — LIDOCAINE HCL (CARDIAC) PF 100 MG/5ML IV SOSY
PREFILLED_SYRINGE | INTRAVENOUS | Status: DC | PRN
  Administered 2023-11-20: 100 mg via INTRAVENOUS

## 2023-11-20 MED ORDER — GLYCOPYRROLATE 0.2 MG/ML IJ SOLN
INTRAMUSCULAR | Status: DC | PRN
  Administered 2023-11-20: .2 mg via INTRAVENOUS

## 2023-11-20 MED ORDER — GLYCOPYRROLATE 0.2 MG/ML IJ SOLN
INTRAMUSCULAR | Status: AC
  Filled 2023-11-20: qty 1

## 2023-11-20 MED ORDER — SODIUM CHLORIDE 0.9 % IV SOLN: INTRAVENOUS | Status: DC

## 2023-11-20 MED ORDER — DEXMEDETOMIDINE HCL IN NACL 80 MCG/20ML IV SOLN
INTRAVENOUS | Status: DC | PRN
Start: 2023-11-20 — End: 2023-11-20
  Administered 2023-11-20: 4 ug via INTRAVENOUS

## 2023-11-20 NOTE — Op Note (Signed)
[facility]enterology Patient Name: Belinda Garcia Procedure Date: 11/20/2023 1:40 PM MRN: [id number] Account #: [id number] Date of Birth: 20-Nov-1950 Admit Type: Outpatient Age: 73 Room: [facility] ENDO ROOM 3 Gender: Female Note Status: Finalized Instrument Name: Upper Endoscope [id number] Procedure:             Upper GI endoscopy Indications:           Dysphagia Providers:             Eather Colas MD, MD Medicines:             Monitored Anesthesia Care Complications:         No immediate complications. Procedure:             Pre-Anesthesia Assessment:                        - Prior to the procedure, a History and Physical was                         performed, and patient medications and allergies were                         reviewed. The patient is competent. The risks and                         benefits of the procedure and the sedation options and                         risks were discussed with the patient. All questions                         were answered and informed consent was obtained.                         Patient identification and proposed procedure were                         verified by the physician, the nurse, the                         anesthesiologist, the anesthetist and the technician                         in the endoscopy suite. Mental Status Examination:                         alert and oriented. Airway Examination: normal                         oropharyngeal airway and neck mobility. Respiratory                         Examination: clear to auscultation. CV Examination:                         normal. Prophylactic Antibiotics: The patient does not                         require prophylactic antibiotics. Prior  Anticoagulants: The patient has taken no anticoagulant                         or antiplatelet agents. ASA Grade Assessment: III - A                         patient with severe systemic disease.  After reviewing                         the risks and benefits, the patient was deemed in                         satisfactory condition to undergo the procedure. The                         anesthesia plan was to use monitored anesthesia care                         (MAC). Immediately prior to administration of                         medications, the patient was re-assessed for adequacy                         to receive sedatives. The heart rate, respiratory                         rate, oxygen saturations, blood pressure, adequacy of                         pulmonary ventilation, and response to care were                         monitored throughout the procedure. The physical                         status of the patient was re-assessed after the                         procedure.                        After obtaining informed consent, the endoscope was                         passed under direct vision. Throughout the procedure,                         the patient's blood pressure, pulse, and oxygen                         saturations were monitored continuously. The Endoscope                         was introduced through the mouth, and advanced to the                         second part of duodenum. The upper GI endoscopy was  accomplished without difficulty. The patient tolerated                         the procedure well. Findings:      No endoscopic abnormality was evident in the esophagus to explain the       patient's complaint of dysphagia. It was decided, however, to proceed       with dilation of the lower third of the esophagus. A TTS dilator was       passed through the scope. Dilation with a 15-16.5-18 mm balloon dilator       was performed to 18 mm. The dilation site was examined and showed no       change. Estimated blood loss: none.      The entire examined stomach was normal.      The examined duodenum was normal. Impression:            - No  endoscopic esophageal abnormality to explain                         patient's dysphagia. Esophagus dilated. Dilated.                        - Normal stomach.                        - Normal examined duodenum.                        - No specimens collected. Recommendation:        - Discharge patient to home.                        - Resume previous diet.                        - Continue present medications.                        - Return to referring physician as previously                         scheduled. Procedure Code(s):     --- Professional ---                        [phone], Esophagogastroduodenoscopy, flexible,                         transoral; with transendoscopic balloon dilation of                         esophagus (less than 30 mm diameter) Diagnosis Code(s):     --- Professional ---                        R13.10, Dysphagia, unspecified CPT copyright 2022 [employer] Association. All rights reserved. The codes documented in this report are preliminary and upon coder review may  be revised to meet current compliance requirements. Eather Colas MD, MD 11/20/2023 2:21:58 PM Number of Addenda: 0 Note Initiated On: 11/20/2023 1:40 PM Estimated Blood Loss:  Estimated blood loss: none.      [facility]
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
## 2023-11-20 NOTE — Transfer of Care (Signed)
Immediate Anesthesia Transfer of Care Note  Patient: Belinda Garcia  Procedure(s) Performed: COLONOSCOPY WITH PROPOFOL ESOPHAGOGASTRODUODENOSCOPY (EGD) WITH PROPOFOL Balloon dilation wire-guided  Patient Location: PACU and Endoscopy Unit  Anesthesia Type:General  Level of Consciousness: awake, alert , oriented, and patient cooperative  Airway & Oxygen Therapy: Patient Spontanous Breathing  Post-op Assessment: Report given to RN, Post -op Vital signs reviewed and stable, and Patient moving all extremities X 4  Post vital signs: Reviewed and stable  Last Vitals:  Vitals Value Taken Time  BP 116/85 11/20/23 1420  Temp 36.4 C 11/20/23 1420  Pulse 103 11/20/23 1421  Resp 18 11/20/23 1421  SpO2 95 % 11/20/23 1421  Vitals shown include unfiled device data.  Last Pain:  Vitals:   11/20/23 1420  TempSrc:   PainSc: 0-No pain         Complications: No notable events documented.

## 2023-11-20 NOTE — Interval H&P Note (Signed)
History and Physical Interval Note:  11/20/2023 1:50 PM  Belinda Garcia  has presented today for surgery, with the diagnosis of Z86.0101 (ICD-10-CM) - Hx of adenomatous colonic polyps K21.9 (ICD-10-CM) - Gastroesophageal reflux disease, unspecified whether esophagitis present R13.10 (ICD-10-CM) - Dysphagia, unspecified type K22.2 (ICD-10-CM) - Schatzki's ring.  The various methods of treatment have been discussed with the patient and family. After consideration of risks, benefits and other options for treatment, the patient has consented to  Procedure(s): COLONOSCOPY WITH PROPOFOL (N/A) ESOPHAGOGASTRODUODENOSCOPY (EGD) WITH PROPOFOL (N/A) as a surgical intervention.  The patient's history has been reviewed, patient examined, no change in status, stable for surgery.  I have reviewed the patient's chart and labs.  Questions were answered to the patient's satisfaction.     Regis Bill  Ok to proceed with EGD/Colonoscopy

## 2023-11-20 NOTE — Op Note (Signed)
[facility] Gastroenterology Patient Name: Belinda Garcia Procedure Date: 11/20/2023 1:40 PM MRN: [id number] Account #: [id number] Date of Birth: [birth year] Admit Type: Outpatient Age: 73 Room: [facility] ENDO ROOM 3 Gender: Female Note Status: Finalized Instrument Name: Prentice Docker [id number] Procedure:             Colonoscopy Indications:           High risk colon cancer surveillance: Personal history                         of colonic polyps, Surveillance: Personal history of                         adenomatous polyps on last colonoscopy > 5 years ago Providers:             Eather Colas MD, MD Medicines:             Monitored Anesthesia Care Complications:         No immediate complications. Procedure:             Pre-Anesthesia Assessment:                        - Prior to the procedure, a History and Physical was                         performed, and patient medications and allergies were                         reviewed. The patient is competent. The risks and                         benefits of the procedure and the sedation options and                         risks were discussed with the patient. All questions                         were answered and informed consent was obtained.                         Patient identification and proposed procedure were                         verified by the physician, the nurse, the                         anesthesiologist, the anesthetist and the technician                         in the endoscopy suite. Mental Status Examination:                         alert and oriented. Airway Examination: normal                         oropharyngeal airway and neck mobility. Respiratory                         Examination: clear to auscultation.  CV Examination:                         normal. Prophylactic Antibiotics: The patient does not                         require prophylactic antibiotics. Prior                          Anticoagulants: The patient has taken no anticoagulant                         or antiplatelet agents. ASA Grade Assessment: III - A                         patient with severe systemic disease. After reviewing                         the risks and benefits, the patient was deemed in                         satisfactory condition to undergo the procedure. The                         anesthesia plan was to use monitored anesthesia care                         (MAC). Immediately prior to administration of                         medications, the patient was re-assessed for adequacy                         to receive sedatives. The heart rate, respiratory                         rate, oxygen saturations, blood pressure, adequacy of                         pulmonary ventilation, and response to care were                         monitored throughout the procedure. The physical                         status of the patient was re-assessed after the                         procedure.                        After obtaining informed consent, the colonoscope was                         passed under direct vision. Throughout the procedure,                         the patient's blood pressure, pulse, and oxygen  saturations were monitored continuously. The                         Colonoscope was introduced through the anus and                         advanced to the the cecum, identified by appendiceal                         orifice and ileocecal valve. The colonoscopy was                         performed without difficulty. The patient tolerated                         the procedure well. The quality of the bowel                         preparation was good. The ileocecal valve, appendiceal                         orifice, and rectum were photographed. Findings:      The perianal and digital rectal examinations were normal.      There was a large lipoma, in the transverse  colon.      Internal hemorrhoids were found during retroflexion. The hemorrhoids       were Grade I (internal hemorrhoids that do not prolapse).      The exam was otherwise without abnormality on direct and retroflexion       views. 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                        - Internal hemorrhoids.                        - The examination was otherwise normal on direct and                         retroflexion views.                        - No specimens collected. Recommendation:        - Discharge patient to home.                        - Resume previous diet.                        - Continue present medications.                        - Repeat colonoscopy is not recommended due to current                         age (50 years or older) for surveillance.                        - Return to referring physician as previously  scheduled. Procedure Code(s):     --- Professional ---                        Z6109, Colorectal cancer screening; colonoscopy on                         individual at high risk Diagnosis Code(s):     --- Professional ---                        D17.5, Benign lipomatous neoplasm of intra-abdominal                         organs                        K64.0, First degree hemorrhoids                        Z86.010, Personal history of colonic polyps CPT copyright 2022 [employer]. All rights reserved. The codes documented in this report are preliminary and upon coder review may  be revised to meet current compliance requirements. Eather Colas MD, MD 11/20/2023 2:26:00 PM Number of Addenda: 0 Note Initiated On: 11/20/2023 1:40 PM Scope Withdrawal Time: 0 hours 6 minutes 28 seconds  Total Procedure Duration: 0 hours 11 minutes 43 seconds  Estimated Blood Loss:  Estimated blood loss: none.      [facility]
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
## 2023-11-23 ENCOUNTER — Encounter: Payer: Self-pay | Admitting: Gastroenterology

## 2023-11-24 NOTE — Anesthesia Postprocedure Evaluation (Signed)
Anesthesia Post Note  Patient: MASAE LUKACS  Procedure(s) Performed: COLONOSCOPY WITH PROPOFOL ESOPHAGOGASTRODUODENOSCOPY (EGD) WITH PROPOFOL Balloon dilation wire-guided  Patient location during evaluation: Endoscopy Anesthesia Type: General Level of consciousness: awake and alert Pain management: pain level controlled Vital Signs Assessment: post-procedure vital signs reviewed and stable Respiratory status: spontaneous breathing, nonlabored ventilation, respiratory function stable and patient connected to nasal cannula oxygen Cardiovascular status: blood pressure returned to baseline and stable Postop Assessment: no apparent nausea or vomiting Anesthetic complications: no   No notable events documented.   Last Vitals:  Vitals:   11/20/23 1420 11/20/23 1430  BP: 116/85 (!) 166/97  Pulse: (!) 102 (!) 104  Resp: 18 (!) 23  Temp: (!) 36.4 C   SpO2: 95% 97%    Last Pain:  Vitals:   11/20/23 1430  TempSrc:   PainSc: 0-No pain                 Lenard Simmer

## 2023-12-10 ENCOUNTER — Other Ambulatory Visit: Payer: Self-pay | Admitting: Acute Care

## 2023-12-10 DIAGNOSIS — Z87891 Personal history of nicotine dependence: Secondary | ICD-10-CM

## 2023-12-10 DIAGNOSIS — Z122 Encounter for screening for malignant neoplasm of respiratory organs: Secondary | ICD-10-CM

## 2023-12-17 ENCOUNTER — Ambulatory Visit

## 2024-01-11 ENCOUNTER — Telehealth: Payer: Self-pay | Admitting: *Deleted

## 2024-01-11 NOTE — Telephone Encounter (Signed)
 Called and left Voicemail for patient to call to see if she had lung screening CT done recently. Notes say that patient may have had this done at [facility] in [place]. Left message for pt to call back to discuss.

## 2024-05-29 NOTE — Progress Notes (Signed)
 Chief Complaint: Chief Complaint  Patient presents with  . Cough    Congestion x few months  . Memory Loss    X1 month-forgetful    Subjective: Belinda Garcia is a 73 y.o. female in today for: Continued cough and congestion Patient is brought in today by her son for continued cough and congestion.  This has been going on for a few months.  She has been on rounds of antibiotics and prednisone .  It will get better for a little while and then comes back.  She is still smoking.  Son notes that her sugars are not controlled she seems to be very forgetful over the last month.  She no-showed her last endocrinology appointment and has not been back.  Past Medical History:  Diagnosis Date  . Allergic rhinitis   . Asthma without status asthmaticus (HHS-HCC)   . Chronic back pain   . COPD (chronic obstructive pulmonary disease) (CMS/HHS-HCC)   . Diabetes mellitus (CMS/HHS-HCC)   . Fatty liver 08/19/2018  . GERD (gastroesophageal reflux disease)   . Heart murmur   . History of adenomatous polyp of colon 03/06/2017  . Hyperglycemia   . Hyperlipidemia   . Hypertension   . Osteoarthritis   . Pulmonary emboli (CMS/HHS-HCC)   . Reflux esophagitis   . Thyroid disease    Past Surgical History:  Procedure Laterality Date  . COLONOSCOPY  01/30/2012   Adenomatous Polyp, [facility] (Father): [facility]BF 01/2017; Recall Ltr mailed 12/02/2016 (dw)  . COLONOSCOPY  06/10/2017   PH Adenomatous Polyp, [facility] (Father): CBf 06/2022  . EGD  06/10/2017   Gastritis: No repeat per RTE  . Colon @ [facility]  11/20/2023   Colon unremarkable. No repeat needed due to age/CTL  . EGD @ [facility]  11/20/2023   EGD unremarkable. No repeat needed due to age/CTL  . Cardiac catheterization - 08/1999    . CHOLECYSTECTOMY    . COLONOSCOPY  12/04/2006, 04/15/2002   [facility] (Father)  . EGD  01/30/2012, 12/04/2006, 04/15/2002   No repeat per RTE  . HYSTERECTOMY    . pulmonary embolisim    . S/P DVT, right leg, post-op    . SPLENECTOMY      Family History  Problem Relation Name Age of Onset  . High blood pressure (Hypertension) Mother    . Stroke Mother    . Diabetes type II Mother    . Heart disease Mother    . High blood pressure (Hypertension) Father    . Stroke Father    . Colon cancer Father    . Diabetes type II Father    . Asthma Father     Current Outpatient Medications on File Prior to Visit  Medication Sig Dispense Refill  . albuterol  (PROVENTIL ) 2.5 mg /3 mL (0.083 %) nebulizer solution Take 3 mLs (2.5 mg total) by nebulization every 6 (six) hours as needed for Wheezing 75 mL 12  . amLODIPine  (NORVASC ) 5 MG tablet Take 5 mg by mouth once daily    . aspirin  81 MG EC tablet Take 81 mg by mouth once daily    . atorvastatin  (LIPITOR) 40 MG tablet Take 1 tablet (40 mg total) by mouth once daily 90 tablet 3  . blood-glucose sensor (DEXCOM G7 SENSOR) Devi Use 1 each every 10 (ten) days 9 each 3  . chlorhexidine (PERIDEX) 0.12 % solution     . cyclobenzaprine  (FLEXERIL ) 10 MG tablet TAKE ONE TABLET BY MOUTH TWICE DAILY AS NEEDED FOR MUSCLE SPASMS 30 tablet  0  . diazePAM  (VALIUM ) 10 MG tablet TAKE ONE TABLET BY MOUTH TWICE DAILY 45 tablet 0  . diclofenac (VOLTAREN) 1 % topical gel Apply 2 g topically 4 (four) times daily 200 g 3  . docusate (COLACE) 100 MG capsule TAKE 1 CAPSULE BY MOUTH 2 (TWO) TIMES DAILY FOR 14 DAYS. HOLD FOR LOOSE STOOL, MULTIPLE BMS.    . DULoxetine  (CYMBALTA ) 30 MG DR capsule TAKE TWO CAPSULES BY MOUTH EVERY DAY 180 capsule 3  . esomeprazole (NEXIUM) 40 MG DR capsule TAKE (1) CAPSULE BY MOUTH ONCE DAILY. 90 capsule 3  . fluticasone  propionate (FLONASE ) 50 mcg/actuation nasal spray Place 2 sprays into both nostrils once daily    . glipiZIDE -metFORMIN  (METAGLIP ) 5-500 mg tablet TAKE (1) TABLET BY MOUTH TWICE A DAY WITH MEALS (BREAKFAST AND SUPPER) 180 tablet 3  . hydroCHLOROthiazide  (HYDRODIURIL ) 25 MG tablet Take 1 tablet by mouth once daily    . insulin  GLARGINE (LANTUS  SOLOSTAR U-100 INSULIN )  pen injector (concentration 100 units/mL) Inject 6 Units subcutaneously at bedtime Only take if blood sugar is over 200 at bedtime. 15 mL 1  . ipratropium-albuterol  (COMBIVENT) 18-103 mcg/actuation inhaler Inhale 2 inhalations into the lungs 4 (four) times daily    . isosorbide  mononitrate (IMDUR ) 60 MG ER tablet Take 1 tablet (60 mg total) by mouth once daily 90 tablet 3  . levothyroxine  (SYNTHROID ) 125 MCG tablet TAKE 1 TABLET BY MOUTH ONCE DAILY. TAKE ON AN EMPTY STOMACH WITH A GLASS OF WATER AT LEAST 30-60 MINUTES BEFORE BREAKFAST. 90 tablet 3  . loperamide  (IMODIUM ) 2 mg capsule Take 1 capsule by mouth once daily    . losartan -hydroCHLOROthiazide  (HYZAAR) 100-25 mg tablet Take 1 tablet by mouth once daily 90 tablet 3  . meclizine  (ANTIVERT ) 25 mg tablet Take 1 tablet (25 mg total) by mouth 3 (three) times daily as needed for Dizziness 90 tablet 1  . meloxicam  (MOBIC ) 15 MG tablet TAKE ONE TABLET BY MOUTH ONCE DAILY 30 tablet 3  . metoprolol  SUCCinate (TOPROL -XL) 25 MG XL tablet Take 1 tablet (25 mg total) by mouth once daily 90 tablet 3  . montelukast  (SINGULAIR ) 10 mg tablet TAKE 1 TABLET BY MOUTH ONCE DAILY. 90 tablet 3  . nitroGLYcerin  (NITROSTAT ) 0.4 MG SL tablet as directed    . pen needle, diabetic 32 gauge x 1/4 needle Use as directed 100 each 12  . pregabalin  (LYRICA ) 75 MG capsule Take 2 capsules (150 mg total) by mouth 2 (two) times daily 120 capsule 11  . traMADoL  (ULTRAM ) 50 mg tablet Take 2 tablets (100 mg total) by mouth every 6 (six) hours as needed for Pain 40 tablet 5  . busPIRone (BUSPAR) 15 MG tablet Take 1 tablet (15 mg total) by mouth 2 (two) times daily before meals for 30 days 60 tablet 0   No current facility-administered medications on file prior to visit.    Review of Systems - 10 systems negative except as in HPI Objective: Vitals:   05/30/24 1038  BP: 118/72  Pulse: 76   Body mass index is 31.95 kg/m.  In general she is in no acute distress, alert Lungs  show bilateral rhonchi, occasional wheeze, poor to moderate air movement Heart regular rate and rhythm without murmurs rubs or gallops Extremities without cyanosis or edema Assessment & Plan: Diagnoses and all orders for this visit:  Uncontrolled type 2 DM with hyperosmolar nonketotic hyperglycemia (CMS/HHS-HCC) -     Comprehensive Metabolic Panel (CMP) -     Hemoglobin  A1C -     Microalbumin/Creatinine Ratio, Random Urine Will try to get her appointment with endocrinology as soon as possible, A1c today Chronic obstructive pulmonary disease, unspecified COPD type (CMS/HHS-HCC) -     Ambulatory Referral to Pulmonology -     ipratropium-albuteroL  (DUO-NEB) nebulizer solution; Take 3 mLs by nebulization 4 (four) times daily for 360 days We will try to get her back into pulmonology, encouraged smoking cessation, seems to use nebulizer more readily than inhalers so we will try going back on the DuoNeb Cough, unspecified type -     Ambulatory Referral to Pulmonology -     ipratropium-albuteroL  (DUO-NEB) nebulizer solution; Take 3 mLs by nebulization 4 (four) times daily for 360 days  Memory changes -     Thyroid Stimulating-Hormone (TSH) -     Vitamin B12 -     Treponema pallidum Antibodies - [facility] -     Ambulatory Referral to Neurology  Tobacco dependence   Again encouraged smoking cessation

## 2024-06-02 ENCOUNTER — Inpatient Hospital Stay
Admission: EM | Admit: 2024-06-02 | Discharge: 2024-06-10 | DRG: 193 | Disposition: A | Discharge status: Skilled Nursing Facility

## 2024-06-02 ENCOUNTER — Other Ambulatory Visit: Payer: Self-pay

## 2024-06-02 ENCOUNTER — Emergency Department

## 2024-06-02 DIAGNOSIS — Z79890 Hormone replacement therapy (postmenopausal): Secondary | ICD-10-CM

## 2024-06-02 DIAGNOSIS — Z9842 Cataract extraction status, left eye: Secondary | ICD-10-CM

## 2024-06-02 DIAGNOSIS — J441 Chronic obstructive pulmonary disease with (acute) exacerbation: Secondary | ICD-10-CM | POA: Diagnosis not present

## 2024-06-02 DIAGNOSIS — M797 Fibromyalgia: Secondary | ICD-10-CM | POA: Diagnosis present

## 2024-06-02 DIAGNOSIS — Z86711 Personal history of pulmonary embolism: Secondary | ICD-10-CM

## 2024-06-02 DIAGNOSIS — J45909 Unspecified asthma, uncomplicated: Secondary | ICD-10-CM | POA: Diagnosis present

## 2024-06-02 DIAGNOSIS — F17200 Nicotine dependence, unspecified, uncomplicated: Secondary | ICD-10-CM | POA: Diagnosis present

## 2024-06-02 DIAGNOSIS — E7800 Pure hypercholesterolemia, unspecified: Secondary | ICD-10-CM | POA: Diagnosis present

## 2024-06-02 DIAGNOSIS — Z803 Family history of malignant neoplasm of breast: Secondary | ICD-10-CM

## 2024-06-02 DIAGNOSIS — J9621 Acute and chronic respiratory failure with hypoxia: Secondary | ICD-10-CM | POA: Diagnosis present

## 2024-06-02 DIAGNOSIS — Z90710 Acquired absence of both cervix and uterus: Secondary | ICD-10-CM

## 2024-06-02 DIAGNOSIS — E669 Obesity, unspecified: Secondary | ICD-10-CM | POA: Insufficient documentation

## 2024-06-02 DIAGNOSIS — Z716 Tobacco abuse counseling: Secondary | ICD-10-CM

## 2024-06-02 DIAGNOSIS — Z791 Long term (current) use of non-steroidal anti-inflammatories (NSAID): Secondary | ICD-10-CM

## 2024-06-02 DIAGNOSIS — I10 Essential (primary) hypertension: Secondary | ICD-10-CM | POA: Diagnosis present

## 2024-06-02 DIAGNOSIS — J189 Pneumonia, unspecified organism: Secondary | ICD-10-CM | POA: Diagnosis not present

## 2024-06-02 DIAGNOSIS — G8929 Other chronic pain: Secondary | ICD-10-CM | POA: Diagnosis present

## 2024-06-02 DIAGNOSIS — Z9081 Acquired absence of spleen: Secondary | ICD-10-CM

## 2024-06-02 DIAGNOSIS — E119 Type 2 diabetes mellitus without complications: Secondary | ICD-10-CM

## 2024-06-02 DIAGNOSIS — M545 Low back pain, unspecified: Secondary | ICD-10-CM | POA: Diagnosis present

## 2024-06-02 DIAGNOSIS — J440 Chronic obstructive pulmonary disease with acute lower respiratory infection: Secondary | ICD-10-CM | POA: Diagnosis present

## 2024-06-02 DIAGNOSIS — T380X5A Adverse effect of glucocorticoids and synthetic analogues, initial encounter: Secondary | ICD-10-CM | POA: Diagnosis present

## 2024-06-02 DIAGNOSIS — Z7984 Long term (current) use of oral hypoglycemic drugs: Secondary | ICD-10-CM

## 2024-06-02 DIAGNOSIS — Z79891 Long term (current) use of opiate analgesic: Secondary | ICD-10-CM

## 2024-06-02 DIAGNOSIS — K760 Fatty (change of) liver, not elsewhere classified: Secondary | ICD-10-CM | POA: Diagnosis present

## 2024-06-02 DIAGNOSIS — Z1152 Encounter for screening for COVID-19: Secondary | ICD-10-CM

## 2024-06-02 DIAGNOSIS — Z7982 Long term (current) use of aspirin: Secondary | ICD-10-CM

## 2024-06-02 DIAGNOSIS — Z794 Long term (current) use of insulin: Secondary | ICD-10-CM

## 2024-06-02 DIAGNOSIS — I2699 Other pulmonary embolism without acute cor pulmonale: Secondary | ICD-10-CM | POA: Diagnosis present

## 2024-06-02 DIAGNOSIS — Z8701 Personal history of pneumonia (recurrent): Secondary | ICD-10-CM

## 2024-06-02 DIAGNOSIS — E872 Acidosis, unspecified: Secondary | ICD-10-CM | POA: Diagnosis present

## 2024-06-02 DIAGNOSIS — F17210 Nicotine dependence, cigarettes, uncomplicated: Secondary | ICD-10-CM | POA: Diagnosis present

## 2024-06-02 DIAGNOSIS — Z860101 Personal history of adenomatous and serrated colon polyps: Secondary | ICD-10-CM

## 2024-06-02 DIAGNOSIS — I5032 Chronic diastolic (congestive) heart failure: Secondary | ICD-10-CM | POA: Diagnosis present

## 2024-06-02 DIAGNOSIS — I110 Hypertensive heart disease with heart failure: Secondary | ICD-10-CM | POA: Diagnosis present

## 2024-06-02 DIAGNOSIS — E1165 Type 2 diabetes mellitus with hyperglycemia: Secondary | ICD-10-CM | POA: Diagnosis present

## 2024-06-02 DIAGNOSIS — Z6833 Body mass index (BMI) 33.0-33.9, adult: Secondary | ICD-10-CM

## 2024-06-02 DIAGNOSIS — J9811 Atelectasis: Secondary | ICD-10-CM | POA: Diagnosis present

## 2024-06-02 DIAGNOSIS — Z961 Presence of intraocular lens: Secondary | ICD-10-CM | POA: Diagnosis present

## 2024-06-02 LAB — RESP PANEL BY RT-PCR (RSV, FLU A&B, COVID)  RVPGX2
Influenza A by PCR: NEGATIVE
Influenza B by PCR: NEGATIVE
Resp Syncytial Virus by PCR: NEGATIVE
SARS Coronavirus 2 by RT PCR: NEGATIVE

## 2024-06-02 LAB — BLOOD GAS, VENOUS
Acid-Base Excess: 4 mmol/L — ABNORMAL HIGH (ref 0.0–2.0)
Bicarbonate: 31.6 mmol/L — ABNORMAL HIGH (ref 20.0–28.0)
O2 Saturation: 59.7 %
Patient temperature: 37
pCO2, Ven: 60 mmHg (ref 44–60)
pH, Ven: 7.33 (ref 7.25–7.43)
pO2, Ven: 36 mmHg (ref 32–45)

## 2024-06-02 LAB — TROPONIN I (HIGH SENSITIVITY)
Troponin I (High Sensitivity): 7 ng/L (ref <18)
Troponin I (High Sensitivity): 7 ng/L (ref <18)

## 2024-06-02 LAB — CBC WITH DIFFERENTIAL/PLATELET
Abs Immature Granulocytes: 0.11 K/uL — ABNORMAL HIGH (ref 0.00–0.07)
Basophils Absolute: 0 K/uL (ref 0.0–0.1)
Basophils Relative: 0 %
Eosinophils Absolute: 0 K/uL (ref 0.0–0.5)
Eosinophils Relative: 0 %
HCT: 45.5 % (ref 36.0–46.0)
Hemoglobin: 15 g/dL (ref 12.0–15.0)
Immature Granulocytes: 1 %
Lymphocytes Relative: 7 %
Lymphs Abs: 1.2 K/uL (ref 0.7–4.0)
MCH: 32.8 pg (ref 26.0–34.0)
MCHC: 33 g/dL (ref 30.0–36.0)
MCV: 99.3 fL (ref 80.0–100.0)
Monocytes Absolute: 0.2 K/uL (ref 0.1–1.0)
Monocytes Relative: 2 %
Neutro Abs: 14.5 K/uL — ABNORMAL HIGH (ref 1.7–7.7)
Neutrophils Relative %: 90 %
Platelets: 316 K/uL (ref 150–400)
RBC: 4.58 MIL/uL (ref 3.87–5.11)
RDW: 12.8 % (ref 11.5–15.5)
WBC: 16.1 K/uL — ABNORMAL HIGH (ref 4.0–10.5)
nRBC: 0 % (ref 0.0–0.2)

## 2024-06-02 LAB — URINALYSIS, ROUTINE W REFLEX MICROSCOPIC
Bilirubin Urine: NEGATIVE
Glucose, UA: >=500 mg/dL — AB
Hgb urine dipstick: NEGATIVE
Ketones, ur: NEGATIVE mg/dL
Leukocytes,Ua: NEGATIVE
Nitrite: NEGATIVE
Protein, ur: NEGATIVE mg/dL
Specific Gravity, Urine: 1.008 (ref 1.005–1.030)
pH: 5 (ref 5.0–8.0)

## 2024-06-02 LAB — COMPREHENSIVE METABOLIC PANEL WITH GFR
ALT: 19 U/L (ref 0–44)
AST: 31 U/L (ref 15–41)
Albumin: 3.7 g/dL (ref 3.5–5.0)
Alkaline Phosphatase: 78 U/L (ref 38–126)
Anion gap: 15 (ref 5–15)
BUN: 20 mg/dL (ref 8–23)
CO2: 26 mmol/L (ref 22–32)
Calcium: 9.5 mg/dL (ref 8.9–10.3)
Chloride: 96 mmol/L — ABNORMAL LOW (ref 98–111)
Creatinine, Ser: 1.05 mg/dL — ABNORMAL HIGH (ref 0.44–1.00)
GFR, Estimated: 57 mL/min — ABNORMAL LOW (ref >60)
Glucose, Bld: 203 mg/dL — ABNORMAL HIGH (ref 70–99)
Potassium: 5.1 mmol/L (ref 3.5–5.1)
Sodium: 137 mmol/L (ref 135–145)
Total Bilirubin: 1.5 mg/dL — ABNORMAL HIGH (ref 0.0–1.2)
Total Protein: 7.2 g/dL (ref 6.5–8.1)

## 2024-06-02 LAB — GLUCOSE, CAPILLARY
Glucose-Capillary: 320 mg/dL — ABNORMAL HIGH (ref 70–99)
Glucose-Capillary: 352 mg/dL — ABNORMAL HIGH (ref 70–99)

## 2024-06-02 LAB — PROCALCITONIN: Procalcitonin: <0.1 ng/mL

## 2024-06-02 LAB — LACTIC ACID, PLASMA
Lactic Acid, Venous: 2.9 mmol/L (ref 0.5–1.9)
Lactic Acid, Venous: 3.6 mmol/L (ref 0.5–1.9)
Lactic Acid, Venous: 5.8 mmol/L (ref 0.5–1.9)
Lactic Acid, Venous: 5 mmol/L (ref 0.5–1.9)

## 2024-06-02 LAB — BRAIN NATRIURETIC PEPTIDE: B Natriuretic Peptide: 227.1 pg/mL — ABNORMAL HIGH (ref 0.0–100.0)

## 2024-06-02 MED ORDER — ATORVASTATIN CALCIUM 20 MG PO TABS
40.0000 mg | ORAL_TABLET | Freq: Every day | ORAL | Status: DC
Start: 2024-06-02 — End: 2024-06-10
  Administered 2024-06-02 – 2024-06-09 (×8): 40 mg via ORAL
  Filled 2024-06-02 (×8): qty 2

## 2024-06-02 MED ORDER — SODIUM CHLORIDE 0.9 % IV BOLUS
500.0000 mL | Freq: Once | INTRAVENOUS | Status: AC
  Administered 2024-06-02: 500 mL via INTRAVENOUS

## 2024-06-02 MED ORDER — ONDANSETRON HCL 4 MG PO TABS
4.0000 mg | ORAL_TABLET | Freq: Four times a day (QID) | ORAL | Status: DC | PRN
  Administered 2024-06-09: 4 mg via ORAL
  Filled 2024-06-02: qty 1

## 2024-06-02 MED ORDER — METOPROLOL SUCCINATE ER 25 MG PO TB24
25.0000 mg | ORAL_TABLET | Freq: Every day | ORAL | Status: DC
  Administered 2024-06-03 – 2024-06-10 (×8): 25 mg via ORAL
  Filled 2024-06-02 (×8): qty 1

## 2024-06-02 MED ORDER — DULOXETINE HCL 30 MG PO CPEP
60.0000 mg | ORAL_CAPSULE | Freq: Every day | ORAL | Status: DC
  Administered 2024-06-03 – 2024-06-10 (×8): 60 mg via ORAL
  Filled 2024-06-02 (×8): qty 2

## 2024-06-02 MED ORDER — ASPIRIN 81 MG PO TBEC
81.0000 mg | DELAYED_RELEASE_TABLET | Freq: Every day | ORAL | Status: DC
  Administered 2024-06-03 – 2024-06-10 (×8): 81 mg via ORAL
  Filled 2024-06-02 (×8): qty 1

## 2024-06-02 MED ORDER — IPRATROPIUM-ALBUTEROL 0.5-2.5 (3) MG/3ML IN SOLN
3.0000 mL | RESPIRATORY_TRACT | Status: DC
  Administered 2024-06-02 – 2024-06-03 (×4): 3 mL via RESPIRATORY_TRACT
  Filled 2024-06-02 (×5): qty 3

## 2024-06-02 MED ORDER — ONDANSETRON HCL 4 MG/2ML IJ SOLN: 4.0000 mg | Freq: Four times a day (QID) | INTRAMUSCULAR | Status: DC | PRN

## 2024-06-02 MED ORDER — SODIUM CHLORIDE 0.9 % IV SOLN
500.0000 mg | Freq: Once | INTRAVENOUS | Status: AC
  Administered 2024-06-02: 500 mg via INTRAVENOUS
  Filled 2024-06-02: qty 5

## 2024-06-02 MED ORDER — METFORMIN HCL 500 MG PO TABS: 500.0000 mg | ORAL_TABLET | Freq: Two times a day (BID) | ORAL | Status: DC

## 2024-06-02 MED ORDER — SODIUM CHLORIDE 0.9 % IV SOLN: 500.0000 mg | INTRAVENOUS | Status: DC

## 2024-06-02 MED ORDER — TRAMADOL HCL 50 MG PO TABS
50.0000 mg | ORAL_TABLET | Freq: Four times a day (QID) | ORAL | Status: DC | PRN
  Administered 2024-06-05 – 2024-06-10 (×12): 50 mg via ORAL
  Filled 2024-06-02 (×13): qty 1

## 2024-06-02 MED ORDER — MECLIZINE HCL 25 MG PO TABS
25.0000 mg | ORAL_TABLET | Freq: Three times a day (TID) | ORAL | Status: DC | PRN
  Administered 2024-06-06: 25 mg via ORAL
  Filled 2024-06-02 (×2): qty 1

## 2024-06-02 MED ORDER — METHYLPREDNISOLONE SODIUM SUCC 40 MG IJ SOLR
40.0000 mg | Freq: Two times a day (BID) | INTRAMUSCULAR | Status: AC
  Administered 2024-06-02 – 2024-06-03 (×2): 40 mg via INTRAVENOUS
  Filled 2024-06-02 (×2): qty 1

## 2024-06-02 MED ORDER — PANTOPRAZOLE SODIUM 40 MG PO TBEC
40.0000 mg | DELAYED_RELEASE_TABLET | Freq: Every day | ORAL | Status: DC
  Administered 2024-06-03 – 2024-06-10 (×8): 40 mg via ORAL
  Filled 2024-06-02 (×8): qty 1

## 2024-06-02 MED ORDER — MONTELUKAST SODIUM 10 MG PO TABS
10.0000 mg | ORAL_TABLET | Freq: Every day | ORAL | Status: DC
Start: 2024-06-02 — End: 2024-06-10
  Administered 2024-06-02 – 2024-06-09 (×8): 10 mg via ORAL
  Filled 2024-06-02 (×8): qty 1

## 2024-06-02 MED ORDER — AZITHROMYCIN 250 MG PO TABS
250.0000 mg | ORAL_TABLET | Freq: Every day | ORAL | Status: DC
  Administered 2024-06-03: 250 mg via ORAL
  Filled 2024-06-02: qty 1

## 2024-06-02 MED ORDER — IPRATROPIUM-ALBUTEROL 0.5-2.5 (3) MG/3ML IN SOLN
3.0000 mL | Freq: Once | RESPIRATORY_TRACT | Status: AC
  Administered 2024-06-02: 3 mL via RESPIRATORY_TRACT
  Filled 2024-06-02: qty 3

## 2024-06-02 MED ORDER — PREDNISONE 20 MG PO TABS
40.0000 mg | ORAL_TABLET | Freq: Every day | ORAL | Status: DC
  Administered 2024-06-04 – 2024-06-06 (×3): 40 mg via ORAL
  Filled 2024-06-02 (×3): qty 2

## 2024-06-02 MED ORDER — METHYLPREDNISOLONE SODIUM SUCC 125 MG IJ SOLR
60.0000 mg | Freq: Once | INTRAMUSCULAR | Status: AC
  Administered 2024-06-02: 60 mg via INTRAVENOUS
  Filled 2024-06-02: qty 2

## 2024-06-02 MED ORDER — ALBUTEROL SULFATE (2.5 MG/3ML) 0.083% IN NEBU
2.5000 mg | INHALATION_SOLUTION | Freq: Once | RESPIRATORY_TRACT | Status: AC
  Administered 2024-06-02: 2.5 mg via RESPIRATORY_TRACT
  Filled 2024-06-02: qty 3

## 2024-06-02 MED ORDER — ALBUTEROL SULFATE (2.5 MG/3ML) 0.083% IN NEBU
2.5000 mg | INHALATION_SOLUTION | RESPIRATORY_TRACT | Status: DC | PRN
Start: 2024-06-02 — End: 2024-06-10
  Administered 2024-06-02 – 2024-06-06 (×5): 2.5 mg via RESPIRATORY_TRACT
  Filled 2024-06-02 (×5): qty 3

## 2024-06-02 MED ORDER — CYCLOBENZAPRINE HCL 10 MG PO TABS
10.0000 mg | ORAL_TABLET | Freq: Three times a day (TID) | ORAL | Status: DC | PRN
  Administered 2024-06-03 – 2024-06-07 (×3): 10 mg via ORAL
  Filled 2024-06-02 (×3): qty 1

## 2024-06-02 MED ORDER — FLUTICASONE FUROATE-VILANTEROL 200-25 MCG/ACT IN AEPB
1.0000 | INHALATION_SPRAY | Freq: Every day | RESPIRATORY_TRACT | Status: DC
  Administered 2024-06-03 – 2024-06-06 (×4): 1 via RESPIRATORY_TRACT
  Filled 2024-06-02 (×3): qty 28

## 2024-06-02 MED ORDER — LOSARTAN POTASSIUM 50 MG PO TABS
100.0000 mg | ORAL_TABLET | Freq: Every day | ORAL | Status: DC
  Administered 2024-06-03 – 2024-06-10 (×8): 100 mg via ORAL
  Filled 2024-06-02 (×8): qty 2

## 2024-06-02 MED ORDER — LEVOTHYROXINE SODIUM 50 MCG PO TABS
125.0000 ug | ORAL_TABLET | Freq: Every day | ORAL | Status: DC
  Administered 2024-06-03 – 2024-06-10 (×8): 125 ug via ORAL
  Filled 2024-06-02 (×9): qty 1

## 2024-06-02 MED ORDER — ACETAMINOPHEN 325 MG PO TABS
650.0000 mg | ORAL_TABLET | Freq: Four times a day (QID) | ORAL | Status: DC | PRN
  Administered 2024-06-03 – 2024-06-10 (×10): 650 mg via ORAL
  Filled 2024-06-02 (×10): qty 2

## 2024-06-02 MED ORDER — LOSARTAN POTASSIUM-HCTZ 100-25 MG PO TABS: 1.0000 | ORAL_TABLET | Freq: Every day | ORAL | Status: DC

## 2024-06-02 MED ORDER — INSULIN ASPART 100 UNIT/ML IJ SOLN
0.0000 [IU] | Freq: Every day | INTRAMUSCULAR | Status: DC
  Administered 2024-06-03: 5 [IU] via SUBCUTANEOUS
  Filled 2024-06-02 (×2): qty 1

## 2024-06-02 MED ORDER — PREGABALIN 75 MG PO CAPS
75.0000 mg | ORAL_CAPSULE | Freq: Two times a day (BID) | ORAL | Status: DC
  Administered 2024-06-02 – 2024-06-10 (×16): 75 mg via ORAL
  Filled 2024-06-02 (×16): qty 1

## 2024-06-02 MED ORDER — SENNOSIDES-DOCUSATE SODIUM 8.6-50 MG PO TABS
1.0000 | ORAL_TABLET | Freq: Every evening | ORAL | Status: DC | PRN
  Administered 2024-06-09: 1 via ORAL
  Filled 2024-06-02: qty 1

## 2024-06-02 MED ORDER — GLIPIZIDE 5 MG PO TABS: 5.0000 mg | ORAL_TABLET | Freq: Two times a day (BID) | ORAL | Status: DC

## 2024-06-02 MED ORDER — FLUTICASONE-SALMETEROL 115-21 MCG/ACT IN AERO: 2.0000 | INHALATION_SPRAY | Freq: Two times a day (BID) | RESPIRATORY_TRACT | Status: DC

## 2024-06-02 MED ORDER — ISOSORBIDE MONONITRATE ER 30 MG PO TB24
60.0000 mg | ORAL_TABLET | Freq: Every day | ORAL | Status: DC
  Administered 2024-06-03 – 2024-06-10 (×8): 60 mg via ORAL
  Filled 2024-06-02 (×7): qty 2

## 2024-06-02 MED ORDER — DIAZEPAM 5 MG PO TABS
10.0000 mg | ORAL_TABLET | Freq: Every evening | ORAL | Status: DC | PRN
  Administered 2024-06-03 – 2024-06-05 (×4): 10 mg via ORAL
  Filled 2024-06-02 (×4): qty 2

## 2024-06-02 MED ORDER — MORPHINE SULFATE (PF) 4 MG/ML IV SOLN
4.0000 mg | Freq: Once | INTRAVENOUS | Status: AC
  Administered 2024-06-02: 4 mg via INTRAVENOUS
  Filled 2024-06-02: qty 1

## 2024-06-02 MED ORDER — DOCUSATE SODIUM 100 MG PO CAPS
100.0000 mg | ORAL_CAPSULE | Freq: Two times a day (BID) | ORAL | Status: DC
  Administered 2024-06-02 – 2024-06-10 (×16): 100 mg via ORAL
  Filled 2024-06-02 (×16): qty 1

## 2024-06-02 MED ORDER — LOPERAMIDE HCL 2 MG PO CAPS: 2.0000 mg | ORAL_CAPSULE | Freq: Every day | ORAL | Status: DC | PRN

## 2024-06-02 MED ORDER — ENOXAPARIN SODIUM 60 MG/0.6ML IJ SOSY
0.5000 mg/kg | PREFILLED_SYRINGE | INTRAMUSCULAR | Status: DC
  Administered 2024-06-02 – 2024-06-09 (×8): 45 mg via SUBCUTANEOUS
  Filled 2024-06-02 (×8): qty 0.6

## 2024-06-02 MED ORDER — HYDROCHLOROTHIAZIDE 25 MG PO TABS
25.0000 mg | ORAL_TABLET | Freq: Every day | ORAL | Status: DC
  Administered 2024-06-03: 25 mg via ORAL
  Filled 2024-06-02: qty 1

## 2024-06-02 MED ORDER — AZITHROMYCIN 500 MG PO TABS: 500.0000 mg | ORAL_TABLET | Freq: Every day | ORAL | Status: DC

## 2024-06-02 MED ORDER — BENZONATATE 100 MG PO CAPS
100.0000 mg | ORAL_CAPSULE | Freq: Three times a day (TID) | ORAL | Status: DC | PRN
  Administered 2024-06-02 – 2024-06-09 (×14): 100 mg via ORAL
  Filled 2024-06-02 (×15): qty 1

## 2024-06-02 MED ORDER — ACETAMINOPHEN 650 MG RE SUPP: 650.0000 mg | Freq: Four times a day (QID) | RECTAL | Status: DC | PRN

## 2024-06-02 MED ORDER — GUAIFENESIN ER 600 MG PO TB12
1200.0000 mg | ORAL_TABLET | Freq: Two times a day (BID) | ORAL | Status: DC
  Administered 2024-06-02 – 2024-06-10 (×17): 1200 mg via ORAL
  Filled 2024-06-02 (×17): qty 2

## 2024-06-02 MED ORDER — GLIPIZIDE-METFORMIN HCL 5-500 MG PO TABS: 1.0000 | ORAL_TABLET | Freq: Two times a day (BID) | ORAL | Status: DC

## 2024-06-02 MED ORDER — INSULIN ASPART 100 UNIT/ML IJ SOLN
0.0000 [IU] | Freq: Three times a day (TID) | INTRAMUSCULAR | Status: DC
  Administered 2024-06-02 – 2024-06-03 (×2): 15 [IU] via SUBCUTANEOUS
  Administered 2024-06-03: 11 [IU] via SUBCUTANEOUS
  Administered 2024-06-03: 4 [IU] via SUBCUTANEOUS
  Administered 2024-06-04: 7 [IU] via SUBCUTANEOUS
  Filled 2024-06-02 (×4): qty 1
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
## 2024-06-02 NOTE — ED Provider Notes (Signed)
 [facility] Provider Note    Event Date/Time   First MD Initiated Contact with Patient 06/02/24 1219     (approximate)   History   Respiratory Distress   HPI  Belinda Garcia is a 73 y.o. female with a history of COPD, asthma, diabetes, hypertension, hyperlipidemia, GERD who presents with shortness of breath.  The patient reports shortness of breath since February, but states that it acutely worsened today.  She was seen at the outpatient clinic a few days ago and started on a steroid.  She states that she took her second dose today.  She reports cough which has become less productive since she was started on the steroid.  She denies any chest pain.  I reviewed past medical records.  The patient was just seen by Dr. Valora from family medicine on 8/25 for cough and congestion.  She was thought to have ongoing symptoms due to COPD and was referred to pulmonology.   Physical Exam   Triage Vital Signs: ED Triage Vitals  Encounter Vitals Group     BP 06/02/24 1219 (!) 144/79     Girls Systolic BP Percentile --      Girls Diastolic BP Percentile --      Boys Systolic BP Percentile --      Boys Diastolic BP Percentile --      Pulse Rate 06/02/24 1219 87     Resp 06/02/24 1219 (!) 25     Temp 06/02/24 1219 97.7 F (36.5 C)     Temp Source 06/02/24 1219 Oral     SpO2 06/02/24 1219 99 %     Weight 06/02/24 1221 199 lb 14.4 oz (90.7 kg)     Height 06/02/24 1221 5' 5 (1.651 m)     Head Circumference --      Peak Flow --      Pain Score 06/02/24 1221 8     Pain Loc --      Pain Education --      Exclude from Growth Chart --     Most recent vital signs: Vitals:   06/02/24 1400 06/02/24 1430  BP: (!) 155/59 (!) 146/61  Pulse: 100 99  Resp: 13 20  Temp:    SpO2: 98% 95%     General: Awake, no distress.  CV:  Good peripheral perfusion.  Resp:  Increased effort, no acute respiratory distress.  Diffuse wheezing and rhonchi bilaterally. Abd:  No  distention.  Other:  Trace bilateral lower extremity edema.   ED Results / Procedures / Treatments   Labs (all labs ordered are listed, but only abnormal results are displayed) Labs Reviewed  COMPREHENSIVE METABOLIC PANEL WITH GFR - Abnormal; Notable for the following components:      Result Value   Chloride 96 (*)    Glucose, Bld 203 (*)    Creatinine, Ser 1.05 (*)    Total Bilirubin 1.5 (*)    GFR, Estimated 57 (*)    All other components within normal limits  LACTIC ACID, PLASMA - Abnormal; Notable for the following components:   Lactic Acid, Venous 2.9 (*)    All other components within normal limits  BRAIN NATRIURETIC PEPTIDE - Abnormal; Notable for the following components:   B Natriuretic Peptide 227.1 (*)    All other components within normal limits  CBC WITH DIFFERENTIAL/PLATELET - Abnormal; Notable for the following components:   WBC 16.1 (*)    Neutro Abs 14.5 (*)    Abs Immature  Granulocytes 0.11 (*)    All other components within normal limits  BLOOD GAS, VENOUS - Abnormal; Notable for the following components:   Bicarbonate 31.6 (*)    Acid-Base Excess 4.0 (*)    All other components within normal limits  RESP PANEL BY RT-PCR (RSV, FLU A&B, COVID)  RVPGX2  RESPIRATORY PANEL BY PCR  EXPECTORATED SPUTUM ASSESSMENT W GRAM STAIN, RFLX TO RESP C  LACTIC ACID, PLASMA  URINALYSIS, ROUTINE W REFLEX MICROSCOPIC  PROCALCITONIN  HEMOGLOBIN A1C  TROPONIN I (HIGH SENSITIVITY)  TROPONIN I (HIGH SENSITIVITY)     EKG  ED ECG REPORT I, Waylon Cassis, the attending physician, personally viewed and interpreted this ECG.  Date: 06/02/2024 EKG Time: 1222 Rate: 89 Rhythm: normal sinus rhythm QRS Axis: normal Intervals: normal ST/T Wave abnormalities: normal Narrative Interpretation: no evidence of acute ischemia    RADIOLOGY  Chest x-ray: I independently viewed and interpreted the images; there is no focal consolidation or edema  PROCEDURES:  Critical  Care performed: No  Procedures   MEDICATIONS ORDERED IN ED: Medications  azithromycin  (ZITHROMAX ) 500 mg in sodium chloride  0.9 % 250 mL IVPB (500 mg Intravenous New Bag/Given 06/02/24 1508)  aspirin  EC tablet 81 mg (has no administration in time range)  traMADol  (ULTRAM ) tablet 50 mg (has no administration in time range)  atorvastatin  (LIPITOR) tablet 40 mg (has no administration in time range)  isosorbide  mononitrate (IMDUR ) 24 hr tablet 60 mg (has no administration in time range)  losartan -hydrochlorothiazide  (HYZAAR) 100-25 MG per tablet 1 tablet (has no administration in time range)  metoprolol  succinate (TOPROL -XL) 24 hr tablet 25 mg (has no administration in time range)  diazepam  (VALIUM ) tablet 10 mg (has no administration in time range)  DULoxetine  (CYMBALTA ) DR capsule 60 mg (has no administration in time range)  glipiZIDE -metformin  (METAGLIP ) 5-500 MG per tablet 1 tablet (has no administration in time range)  levothyroxine  (SYNTHROID ) tablet 125 mcg (has no administration in time range)  docusate sodium  (COLACE) capsule 100 mg (has no administration in time range)  pantoprazole  (PROTONIX ) EC tablet 40 mg (has no administration in time range)  loperamide  (IMODIUM ) capsule 2 mg (has no administration in time range)  meclizine  (ANTIVERT ) tablet 25 mg (has no administration in time range)  cyclobenzaprine  (FLEXERIL ) tablet 10 mg (has no administration in time range)  pregabalin  (LYRICA ) capsule 75 mg (has no administration in time range)  fluticasone -salmeterol (ADVAIR HFA) 115-21 MCG/ACT inhaler 2 puff (has no administration in time range)  ipratropium-albuterol  (DUONEB) 0.5-2.5 (3) MG/3ML nebulizer solution 3 mL (has no administration in time range)  montelukast  (SINGULAIR ) tablet 10 mg (has no administration in time range)  methylPREDNISolone  sodium succinate (SOLU-MEDROL ) 40 mg/mL injection 40 mg (has no administration in time range)    Followed by  predniSONE  (DELTASONE )  tablet 40 mg (has no administration in time range)  albuterol  (PROVENTIL ) (2.5 MG/3ML) 0.083% nebulizer solution 2.5 mg (has no administration in time range)  enoxaparin  (LOVENOX ) injection 40 mg (has no administration in time range)  ondansetron  (ZOFRAN ) tablet 4 mg (has no administration in time range)    Or  ondansetron  (ZOFRAN ) injection 4 mg (has no administration in time range)  acetaminophen  (TYLENOL ) tablet 650 mg (has no administration in time range)    Or  acetaminophen  (TYLENOL ) suppository 650 mg (has no administration in time range)  senna-docusate (Senokot-S) tablet 1 tablet (has no administration in time range)  guaiFENesin  (MUCINEX ) 12 hr tablet 1,200 mg (has no administration in time range)  benzonatate  (TESSALON )  capsule 100 mg (has no administration in time range)  azithromycin  (ZITHROMAX ) tablet 250 mg (has no administration in time range)  insulin  aspart (novoLOG ) injection 0-20 Units (has no administration in time range)  insulin  aspart (novoLOG ) injection 0-5 Units (has no administration in time range)  ipratropium-albuterol  (DUONEB) 0.5-2.5 (3) MG/3ML nebulizer solution 3 mL (3 mLs Nebulization Given 06/02/24 1232)  albuterol  (PROVENTIL ) (2.5 MG/3ML) 0.083% nebulizer solution 2.5 mg (2.5 mg Nebulization Given 06/02/24 1232)  albuterol  (PROVENTIL ) (2.5 MG/3ML) 0.083% nebulizer solution 2.5 mg (2.5 mg Nebulization Given 06/02/24 1232)  methylPREDNISolone  sodium succinate (SOLU-MEDROL ) 125 mg/2 mL injection 60 mg (60 mg Intravenous Given 06/02/24 1232)  sodium chloride  0.9 % bolus 500 mL (0 mLs Intravenous Stopped 06/02/24 1437)  morphine  (PF) 4 MG/ML injection 4 mg (4 mg Intravenous Given 06/02/24 1445)     IMPRESSION / MDM / ASSESSMENT AND PLAN / ED COURSE  I reviewed the triage vital signs and the nursing notes.  73 year old female with PMH as noted above presents with acute on chronic shortness of breath.  On exam she has diffuse wheezing and increased work of breathing  but no acute distress.  She presents on a nonrebreather.  There is no significant edema.  Differential diagnosis includes, but is not limited to, COPD exacerbation, acute bronchitis, pneumonia, COVID or other viral syndrome, less likely CHF or other cardiac cause.  We will obtain chest x-ray, lab workup, give additional bronchodilators, additional steroid, and reassess.  Patient's presentation is most consistent with acute presentation with potential threat to life or bodily function.  The patient is on the cardiac monitor to evaluate for evidence of arrhythmia and/or significant heart rate changes.   ----------------------------------------- 3:30 PM on 06/02/2024 -----------------------------------------  Chest x-ray shows no acute findings.  The patient is maintaining an O2 saturation in the mid 90s on 3 L O2 by nasal cannula, but is still somewhat tachypneic and has diffuse wheezing.  CBC shows leukocytosis although this may be related to the steroids.  I have added on a procalcitonin to help elucidate for possible infection.  Lactate is elevated but this is likely due to her respiratory distress earlier.  There is no clinical evidence for sepsis.  Respiratory panel is negative.  The patient will need admission for further management.  I consulted Dr. Laurita from the hospitalist service; based on our discussion he agrees to evaluate the patient for admission.   FINAL CLINICAL IMPRESSION(S) / ED DIAGNOSES   Final diagnoses:  COPD exacerbation (HCC)     Rx / DC Orders   ED Discharge Orders     None        Note:  This document was prepared using Dragon voice recognition software and may include unintentional dictation errors.    Jacolyn Pae, MD 06/02/24 1531

## 2024-06-02 NOTE — ED Triage Notes (Signed)
 Pt to ED via [facility] from side of road. C/C respiratory distress. Pt has hx of COPD and chronically wears 3L [place]. Pt was SPO2 86% on RA. Pt 95% on 15L NRB. Pt reports cough since February. Pt taking PO prednisone .   EMS gave 2 duonebs  SPO2 95% on neb  HR 86 BP 150/66  CBG 140 T 98 Axillary

## 2024-06-03 ENCOUNTER — Observation Stay
Admit: 2024-06-03 | Discharge: 2024-06-03 | Disposition: A | Discharge status: Home / Self Care | Attending: Internal Medicine | Admitting: Internal Medicine

## 2024-06-03 ENCOUNTER — Observation Stay

## 2024-06-03 DIAGNOSIS — T380X5A Adverse effect of glucocorticoids and synthetic analogues, initial encounter: Secondary | ICD-10-CM | POA: Diagnosis present

## 2024-06-03 DIAGNOSIS — Z716 Tobacco abuse counseling: Secondary | ICD-10-CM | POA: Diagnosis not present

## 2024-06-03 DIAGNOSIS — J9811 Atelectasis: Secondary | ICD-10-CM | POA: Diagnosis present

## 2024-06-03 DIAGNOSIS — Z794 Long term (current) use of insulin: Secondary | ICD-10-CM | POA: Diagnosis not present

## 2024-06-03 DIAGNOSIS — J189 Pneumonia, unspecified organism: Secondary | ICD-10-CM | POA: Diagnosis present

## 2024-06-03 DIAGNOSIS — Z79890 Hormone replacement therapy (postmenopausal): Secondary | ICD-10-CM | POA: Diagnosis not present

## 2024-06-03 DIAGNOSIS — J440 Chronic obstructive pulmonary disease with acute lower respiratory infection: Secondary | ICD-10-CM | POA: Diagnosis present

## 2024-06-03 DIAGNOSIS — J9621 Acute and chronic respiratory failure with hypoxia: Secondary | ICD-10-CM | POA: Diagnosis present

## 2024-06-03 DIAGNOSIS — I5032 Chronic diastolic (congestive) heart failure: Secondary | ICD-10-CM | POA: Diagnosis present

## 2024-06-03 DIAGNOSIS — E872 Acidosis, unspecified: Secondary | ICD-10-CM | POA: Diagnosis present

## 2024-06-03 DIAGNOSIS — M797 Fibromyalgia: Secondary | ICD-10-CM | POA: Diagnosis present

## 2024-06-03 DIAGNOSIS — Z7984 Long term (current) use of oral hypoglycemic drugs: Secondary | ICD-10-CM | POA: Diagnosis not present

## 2024-06-03 DIAGNOSIS — E7800 Pure hypercholesterolemia, unspecified: Secondary | ICD-10-CM | POA: Diagnosis present

## 2024-06-03 DIAGNOSIS — K760 Fatty (change of) liver, not elsewhere classified: Secondary | ICD-10-CM | POA: Diagnosis present

## 2024-06-03 DIAGNOSIS — E669 Obesity, unspecified: Secondary | ICD-10-CM | POA: Insufficient documentation

## 2024-06-03 DIAGNOSIS — F17210 Nicotine dependence, cigarettes, uncomplicated: Secondary | ICD-10-CM | POA: Diagnosis present

## 2024-06-03 DIAGNOSIS — I110 Hypertensive heart disease with heart failure: Secondary | ICD-10-CM | POA: Diagnosis present

## 2024-06-03 DIAGNOSIS — Z791 Long term (current) use of non-steroidal anti-inflammatories (NSAID): Secondary | ICD-10-CM | POA: Diagnosis not present

## 2024-06-03 DIAGNOSIS — Z6833 Body mass index (BMI) 33.0-33.9, adult: Secondary | ICD-10-CM | POA: Diagnosis not present

## 2024-06-03 DIAGNOSIS — Z7982 Long term (current) use of aspirin: Secondary | ICD-10-CM | POA: Diagnosis not present

## 2024-06-03 DIAGNOSIS — E1165 Type 2 diabetes mellitus with hyperglycemia: Secondary | ICD-10-CM | POA: Diagnosis present

## 2024-06-03 DIAGNOSIS — G8929 Other chronic pain: Secondary | ICD-10-CM | POA: Diagnosis present

## 2024-06-03 DIAGNOSIS — Z1152 Encounter for screening for COVID-19: Secondary | ICD-10-CM | POA: Diagnosis not present

## 2024-06-03 DIAGNOSIS — J441 Chronic obstructive pulmonary disease with (acute) exacerbation: Secondary | ICD-10-CM | POA: Diagnosis present

## 2024-06-03 LAB — RESPIRATORY PANEL BY PCR

## 2024-06-03 LAB — EXPECTORATED SPUTUM ASSESSMENT W GRAM STAIN, RFLX TO RESP C

## 2024-06-03 LAB — ECHOCARDIOGRAM COMPLETE
AR max vel: 2.2 cm2
AV Area VTI: 2.29 cm2
AV Area mean vel: 2.22 cm2
AV Mean grad: 7.5 mmHg
AV Peak grad: 13.8 mmHg
Ao pk vel: 1.86 m/s
Area-P 1/2: 3.99 cm2
Height: 65 in
MV VTI: 2.41 cm2
S' Lateral: 2.5 cm
Weight: 3198.4 [oz_av]

## 2024-06-03 LAB — CBC
HCT: 40 % (ref 36.0–46.0)
Hemoglobin: 13.4 g/dL (ref 12.0–15.0)
MCH: 32.8 pg (ref 26.0–34.0)
MCHC: 33.5 g/dL (ref 30.0–36.0)
MCV: 98 fL (ref 80.0–100.0)
Platelets: 281 K/uL (ref 150–400)
RBC: 4.08 MIL/uL (ref 3.87–5.11)
RDW: 13 % (ref 11.5–15.5)
WBC: 14.3 K/uL — ABNORMAL HIGH (ref 4.0–10.5)
nRBC: 0 % (ref 0.0–0.2)

## 2024-06-03 LAB — GLUCOSE, CAPILLARY
Glucose-Capillary: 343 mg/dL — ABNORMAL HIGH (ref 70–99)
Glucose-Capillary: 288 mg/dL — ABNORMAL HIGH (ref 70–99)
Glucose-Capillary: 195 mg/dL — ABNORMAL HIGH (ref 70–99)
Glucose-Capillary: 414 mg/dL — ABNORMAL HIGH (ref 70–99)

## 2024-06-03 LAB — BASIC METABOLIC PANEL WITH GFR
Anion gap: 12 (ref 5–15)
BUN: 20 mg/dL (ref 8–23)
CO2: 26 mmol/L (ref 22–32)
Calcium: 8.8 mg/dL — ABNORMAL LOW (ref 8.9–10.3)
Chloride: 93 mmol/L — ABNORMAL LOW (ref 98–111)
Creatinine, Ser: 1.04 mg/dL — ABNORMAL HIGH (ref 0.44–1.00)
GFR, Estimated: 57 mL/min — ABNORMAL LOW (ref >60)
Glucose, Bld: 386 mg/dL — ABNORMAL HIGH (ref 70–99)
Potassium: 4.3 mmol/L (ref 3.5–5.1)
Sodium: 131 mmol/L — ABNORMAL LOW (ref 135–145)

## 2024-06-03 LAB — D-DIMER, QUANTITATIVE: D-Dimer, Quant: 0.65 ug{FEU}/mL — ABNORMAL HIGH (ref 0.00–0.50)

## 2024-06-03 LAB — LACTIC ACID, PLASMA: Lactic Acid, Venous: 2.8 mmol/L (ref 0.5–1.9)

## 2024-06-03 LAB — HEMOGLOBIN A1C
Hgb A1c MFr Bld: 8.1 % — ABNORMAL HIGH (ref 4.8–5.6)
Mean Plasma Glucose: 186 mg/dL

## 2024-06-03 MED ORDER — IOHEXOL 350 MG/ML SOLN
75.0000 mL | Freq: Once | INTRAVENOUS | Status: AC | PRN
  Administered 2024-06-03: 75 mL via INTRAVENOUS

## 2024-06-03 MED ORDER — LEVOFLOXACIN 750 MG PO TABS
750.0000 mg | ORAL_TABLET | Freq: Every day | ORAL | Status: DC
  Administered 2024-06-03 – 2024-06-09 (×7): 750 mg via ORAL
  Filled 2024-06-03 (×7): qty 1

## 2024-06-03 MED ORDER — IPRATROPIUM-ALBUTEROL 0.5-2.5 (3) MG/3ML IN SOLN
3.0000 mL | Freq: Four times a day (QID) | RESPIRATORY_TRACT | Status: DC
  Administered 2024-06-03 – 2024-06-06 (×11): 3 mL via RESPIRATORY_TRACT
  Filled 2024-06-03 (×13): qty 3

## 2024-06-03 MED ORDER — SODIUM CHLORIDE 0.9 % IV SOLN: INTRAVENOUS | Status: AC

## 2024-06-03 MED ORDER — INSULIN GLARGINE 100 UNIT/ML ~~LOC~~ SOLN
10.0000 [IU] | Freq: Every day | SUBCUTANEOUS | Status: DC
  Administered 2024-06-03: 10 [IU] via SUBCUTANEOUS
  Filled 2024-06-03 (×2): qty 0.1

## 2024-06-03 MED ORDER — INSULIN ASPART 100 UNIT/ML IJ SOLN
5.0000 [IU] | Freq: Three times a day (TID) | INTRAMUSCULAR | Status: DC
  Administered 2024-06-03 – 2024-06-06 (×10): 5 [IU] via SUBCUTANEOUS
  Filled 2024-06-03 (×7): qty 1

## 2024-06-03 MED ORDER — INSULIN GLARGINE-YFGN 100 UNIT/ML ~~LOC~~ SOLN: 10.0000 [IU] | Freq: Every day | SUBCUTANEOUS | Status: DC

## 2024-06-03 NOTE — Inpatient Diabetes Management (Signed)
 Inpatient Diabetes Program Recommendations  AACE/ADA: New Consensus Statement on Inpatient Glycemic Control (2015)  Target Ranges:  Prepandial:   less than 140 mg/dL      Peak postprandial:   less than 180 mg/dL (1-2 hours)      Critically ill patients:  140 - 180 mg/dL    Latest Reference Range & Units 06/02/24 16:50 06/02/24 23:51 06/03/24 08:00  Glucose-Capillary 70 - 99 mg/dL 679 (H)  15 units Novolog   352 (H)  5 units Novolog   343 (H)  (H): Data is abnormally high      Admit Acute COPD exacerbation/ Acute on chronic hypoxic respiratory failure  History: DM   Home DM Meds: Metaglip  5/500 mg 1 tablet BID       Lantus  6 units at HS for CBG >200  Current Orders: Novolog  0-20 units TID ac/hs (started last PM)      Pt got 60 mg Solumedrol yest Started on Solumedrol 40 mg BID last PM Last dose Solumedrol due at 11am today Will switch to Prednisone  40 mg daily tomorrow AM    MD- Note pt takes Lantus  at home CBG 343 this AM  Please start Lantus  6 units at bedtime    --Will follow patient during hospitalization--  Adina Rudolpho Arrow RN, MSN, CDCES Diabetes Coordinator Inpatient Glycemic Control Team Team Pager: [phone] (8a-5p)

## 2024-06-03 NOTE — Progress Notes (Signed)
*  PRELIMINARY RESULTS* Echocardiogram 2D Echocardiogram has been performed.  Floydene Harder 06/03/2024, 8:51 AM

## 2024-06-03 NOTE — Care Management Obs Status (Signed)
 MEDICARE OBSERVATION STATUS NOTIFICATION   Patient Details  Name: Belinda Garcia MRN: [id number] Date of Birth: 03/01/1951   Medicare Observation Status Notification Given:  Yes    Rojelio [name] 06/03/2024, 11:17 AM

## 2024-06-03 NOTE — Care Management Obs Status (Signed)
 MEDICARE OBSERVATION STATUS NOTIFICATION   Patient Details  Name: Belinda Garcia MRN: [id number] Date of Birth: October 13, 1950   Medicare Observation Status Notification Given:   yes    Rojelio [name] 06/03/2024, 11:16 AM

## 2024-06-03 NOTE — Progress Notes (Addendum)
 PROGRESS NOTE    Belinda Garcia  FMW:[id number] DOB: [birth year] DOA: 06/02/2024 PCP: Valora Agent, MD  Outpatient Specialists: cardiology    Brief Narrative:   From admission h and p  Belinda Garcia is a 73 y.o. female with medical history significant of COPD on as needed oxygen at home, HTN, HLD, IDDM, hypothyroidism, chronic pain on narcotics, obesity, presented with worsening of cough wheezing shortness of breath.   Patient claimed that she was hospitalized at [facility] February this year for double pneumonia.  Since then never got out of it she has been having frequent episodes of COPD flareup with cough wheezing shortness of breath.  This time, she started to have productive cough with clear phlegm 1 week ago, with wheezing and shortness of breath however 2 days ago, her cough became weak and dry, denied any chest pain no fever or chills.  Denies any smoking or passive smoking in the house.  Today, she is going to to see her pulmonology for follow-up however she felt shortness of breath and neighbor called EMS.  EMS arrived and found patient O2 saturation 86% and started patient on 15 L NRB.   Assessment & Plan:   Principal Problem:   COPD exacerbation (HCC) Active Problems:   COPD with acute exacerbation (HCC)   Obesity (BMI 30-39.9)   Asthma without status asthmaticus   Chronic low back pain   Fatty liver   Diabetes mellitus (HCC)   Hypertension   Pulmonary embolism (HCC)   Tobacco use disorder  # COPD with acute exacerbation Cough, dyspnea. Says has been treated with several rounds of antibiotics and steroids since February. Reports she doesn't have a controller inhaler at home, just has albuterol . CXR clear. Does have hx of pe but dimer not elevated for age. Bnp is mildly elevated. Respiratory panels neg. - repeat sputum if patient able to expectorate - change abx to levofloxacin  to cover for pseudomonas given recent abx - continue methylpred for now -  follow blood cultures - CTA to further eval lung parenchyma and given history of PE (and patient concern, she is very concerned about PE though given normal age-adjusted dimer think less likely) - f/u TTE - continue breathing treatments - consider starting controller med at d/c - was scheduled to establish with pulm Alica) yesterday, will need to re-schedule  # Acute hypoxic respiratory failure 2/2 above. Requiring 2 liters - [place] O2, wean as able  # Lactic acidosis May be 2/2 above acute illness. Home metformin  may contribute - f/u tte to assess for chf - trend lactate, will order for today - hold metformin  - consider additional fluids if TTE is benign PM UPDATE: TTE unremarkable, lactate improving after fluids yesterday, will start maintenance fluids  # T2DM With hyperglycemia, steroids contributing - continue ssi resistant - add mealtime 5 and semglee  10 - metformin  on hold as above  # HTN Bp appropriate - home atorva, losartan , metop - will hold home hydrochlorothiazide    # HFpEF Doesn't appear exacerbated - TTE as above - home imdur , metop  # GAD - home valium   # Chronic pain - home duloxetine , pregabalin , tramadol   # Hyothyroid Recent tsh wnl - home levothyroxine   # Obesity Noted  # Debility - PT consult tomorrow    DVT prophylaxis: lovenox  Code Status: full Family Communication: son updated @ bedside 8/29  Level of care: Telemetry Medical Status is: Observation    Consultants:  none  Procedures: none  Antimicrobials:  See above  Subjective: Reports ongoing cough and shortness of breath  Objective: Vitals:   06/02/24 2121 06/03/24 0417 06/03/24 0510 06/03/24 0842  BP: (!) 135/51  (!) 147/69 (!) 145/75  Pulse: (!) 103  93 87  Resp: 20  (!) 24 18  Temp: 97.6 F (36.4 C)  (!) 97.4 F (36.3 C) 98.1 F (36.7 C)  TempSrc:      SpO2: 94% 94% 99% 95%  Weight:      Height:        Intake/Output Summary (Last 24 hours) at  06/03/2024 1013 Last data filed at 06/03/2024 0900 Gross per 24 hour  Intake 1230.9 ml  Output --  Net 1230.9 ml   Filed Weights   06/02/24 1221  Weight: 90.7 kg    Examination:  General exam: Appears ill Respiratory system: tachypnea, cough, rhonchi, faint exp wheeze Cardiovascular system: S1 & S2 heard, RRR. Soft systolic murmur Gastrointestinal system: Abdomen is obese, soft and nontender.   Central nervous system: Alert and oriented. No focal neurological deficits. Extremities: Symmetric 5 x 5 power. Trace LE edema Skin: No rashes, lesions or ulcers Psychiatry: Judgement and insight appear normal. Mood & affect appropriate.     Data Reviewed: I have personally reviewed following labs and imaging studies  CBC: Recent Labs  Lab 06/02/24 1226 06/03/24 0127  WBC 16.1* 14.3*  NEUTROABS 14.5*  --   HGB 15.0 13.4  HCT 45.5 40.0  MCV 99.3 98.0  PLT 316 281   Basic Metabolic Panel: Recent Labs  Lab 06/02/24 1226 06/03/24 0127  NA 137 131*  K 5.1 4.3  CL 96* 93*  CO2 26 26  GLUCOSE 203* 386*  BUN 20 20  CREATININE 1.05* 1.04*  CALCIUM  9.5 8.8*   GFR: Estimated Creatinine Clearance: 55.2 mL/min (A) (by C-G formula based on SCr of 1.04 mg/dL (H)). Liver Function Tests: Recent Labs  Lab 06/02/24 1226  AST 31  ALT 19  ALKPHOS 78  BILITOT 1.5*  PROT 7.2  ALBUMIN 3.7   No results for input(s): LIPASE, AMYLASE in the last 168 hours. No results for input(s): AMMONIA in the last 168 hours. Coagulation Profile: No results for input(s): INR, PROTIME in the last 168 hours. Cardiac Enzymes: No results for input(s): CKTOTAL, CKMB, CKMBINDEX, TROPONINI in the last 168 hours. BNP (last 3 results) No results for input(s): PROBNP in the last 8760 hours. HbA1C: No results for input(s): HGBA1C in the last 72 hours. CBG: Recent Labs  Lab 06/02/24 1650 06/02/24 2351 06/03/24 0800  GLUCAP 320* 352* 343*   Lipid Profile: No results for  input(s): CHOL, HDL, LDLCALC, TRIG, CHOLHDL, LDLDIRECT in the last 72 hours. Thyroid Function Tests: No results for input(s): TSH, T4TOTAL, FREET4, T3FREE, THYROIDAB in the last 72 hours. Anemia Panel: No results for input(s): VITAMINB12, FOLATE, FERRITIN, TIBC, IRON, RETICCTPCT in the last 72 hours. Urine analysis:    Component Value Date/Time   COLORURINE YELLOW (A) 06/02/2024 1919   APPEARANCEUR CLEAR (A) 06/02/2024 1919   LABSPEC 1.008 06/02/2024 1919   PHURINE 5.0 06/02/2024 1919   GLUCOSEU >=500 (A) 06/02/2024 1919   HGBUR NEGATIVE 06/02/2024 1919   BILIRUBINUR NEGATIVE 06/02/2024 1919   KETONESUR NEGATIVE 06/02/2024 1919   PROTEINUR NEGATIVE 06/02/2024 1919   NITRITE NEGATIVE 06/02/2024 1919   LEUKOCYTESUR NEGATIVE 06/02/2024 1919   Sepsis Labs: @LABRCNTIP (procalcitonin:4,lacticidven:4)  ) Recent Results (from the past 240 hours)  Resp panel by RT-PCR (RSV, Flu A&B, Covid) Anterior Nasal Swab     Status: None  Collection Time: 06/02/24  1:20 PM   Specimen: Anterior Nasal Swab  Result Value Ref Range Status   SARS Coronavirus 2 by RT PCR NEGATIVE NEGATIVE Final    Comment: (NOTE) SARS-CoV-2 target nucleic acids are NOT DETECTED.  The SARS-CoV-2 RNA is generally detectable in upper respiratory specimens during the acute phase of infection. The lowest concentration of SARS-CoV-2 viral copies this assay can detect is 138 copies/mL. A negative result does not preclude SARS-Cov-2 infection and should not be used as the sole basis for treatment or other patient management decisions. A negative result may occur with  improper specimen collection/handling, submission of specimen other than nasopharyngeal swab, presence of viral mutation(s) within the areas targeted by this assay, and inadequate number of viral copies(<138 copies/mL). A negative result must be combined with clinical observations, patient history, and  epidemiological information. The expected result is Negative.  Fact Sheet for Patients:  BloggerCourse.com  Fact Sheet for Healthcare Providers:  SeriousBroker.it  This test is no t yet approved or cleared by the [place]  FDA and  has been authorized for detection and/or diagnosis of SARS-CoV-2 by FDA under an Emergency Use Authorization (EUA). This EUA will remain  in effect (meaning this test can be used) for the duration of the COVID-19 declaration under Section 564(b)(1) of the Act, 21 U.S.C.section 360bbb-3(b)(1), unless the authorization is terminated  or revoked sooner.       Influenza A by PCR NEGATIVE NEGATIVE Final   Influenza B by PCR NEGATIVE NEGATIVE Final    Comment: (NOTE) The Xpert Xpress SARS-CoV-2/FLU/RSV plus assay is intended as an aid in the diagnosis of influenza from Nasopharyngeal swab specimens and should not be used as a sole basis for treatment. Nasal washings and aspirates are unacceptable for Xpert Xpress SARS-CoV-2/FLU/RSV testing.  Fact Sheet for Patients: BloggerCourse.com  Fact Sheet for Healthcare Providers: SeriousBroker.it  This test is not yet approved or cleared by the [place]  FDA and has been authorized for detection and/or diagnosis of SARS-CoV-2 by FDA under an Emergency Use Authorization (EUA). This EUA will remain in effect (meaning this test can be used) for the duration of the COVID-19 declaration under Section 564(b)(1) of the Act, 21 U.S.C. section 360bbb-3(b)(1), unless the authorization is terminated or revoked.     Resp Syncytial Virus by PCR NEGATIVE NEGATIVE Final    Comment: (NOTE) Fact Sheet for Patients: BloggerCourse.com  Fact Sheet for Healthcare Providers: SeriousBroker.it  This test is not yet approved or cleared by the [place]  FDA and has been  authorized for detection and/or diagnosis of SARS-CoV-2 by FDA under an Emergency Use Authorization (EUA). This EUA will remain in effect (meaning this test can be used) for the duration of the COVID-19 declaration under Section 564(b)(1) of the Act, 21 U.S.C. section 360bbb-3(b)(1), unless the authorization is terminated or revoked.  Performed at [facility], [address] [place], [place] [address]   Respiratory (~20 pathogens) panel by PCR     Status: None   Collection Time: 06/02/24  6:00 PM   Specimen: Nasopharyngeal Swab; Respiratory  Result Value Ref Range Status   Adenovirus NOT DETECTED NOT DETECTED Final   Coronavirus 229E NOT DETECTED NOT DETECTED Final    Comment: (NOTE) The Coronavirus on the Respiratory Panel, DOES NOT test for the novel  Coronavirus (2019 nCoV)    Coronavirus HKU1 NOT DETECTED NOT DETECTED Final   Coronavirus NL63 NOT DETECTED NOT DETECTED Final   Coronavirus OC43 NOT DETECTED NOT DETECTED Final  Metapneumovirus NOT DETECTED NOT DETECTED Final   Rhinovirus / Enterovirus NOT DETECTED NOT DETECTED Final   Influenza A NOT DETECTED NOT DETECTED Final   Influenza B NOT DETECTED NOT DETECTED Final   Parainfluenza Virus 1 NOT DETECTED NOT DETECTED Final   Parainfluenza Virus 2 NOT DETECTED NOT DETECTED Final   Parainfluenza Virus 3 NOT DETECTED NOT DETECTED Final   Parainfluenza Virus 4 NOT DETECTED NOT DETECTED Final   Respiratory Syncytial Virus NOT DETECTED NOT DETECTED Final   Bordetella pertussis NOT DETECTED NOT DETECTED Final   Bordetella Parapertussis NOT DETECTED NOT DETECTED Final   Chlamydophila pneumoniae NOT DETECTED NOT DETECTED Final   Mycoplasma pneumoniae NOT DETECTED NOT DETECTED Final    Comment: Performed at [facility], [address], [place], [place] [address]  Culture, blood (Routine X 2) w Reflex to ID Panel     Status: None (Preliminary result)   Collection Time: 06/03/24  1:27 AM   Specimen: BLOOD  Result  Value Ref Range Status   Specimen Description BLOOD BLOOD LEFT ARM  Final   Special Requests   Final    BOTTLES DRAWN AEROBIC AND ANAEROBIC Blood Culture adequate volume   Culture   Final    NO GROWTH < 12 HOURS Performed at [facility], [address] [place], [place] [address]    Report Status PENDING  Incomplete  Culture, blood (Routine X 2) w Reflex to ID Panel     Status: None (Preliminary result)   Collection Time: 06/03/24  1:27 AM   Specimen: BLOOD  Result Value Ref Range Status   Specimen Description BLOOD BLOOD RIGHT ARM  Final   Special Requests   Final    BOTTLES DRAWN AEROBIC AND ANAEROBIC Blood Culture adequate volume   Culture   Final    NO GROWTH < 12 HOURS Performed at [facility], [address], [place], [place] [address]    Report Status PENDING  Incomplete  Expectorated Sputum Assessment w Gram Stain, Rflx to Resp Cult     Status: None   Collection Time: 06/03/24  5:20 AM   Specimen: Expectorated Sputum  Result Value Ref Range Status   Specimen Description EXPECTORATED SPUTUM  Final   Special Requests NONE  Final   Sputum evaluation   Final    Sputum specimen not acceptable for testing.  Please recollect.    JANESE ERNESTINA GULLY AT 9389 06/03/24 JG Performed at [facility], [address] [place], [place] [address]    Report Status 06/03/2024 FINAL  Final         Radiology Studies: [facility] Chest [facility] 1 View Result Date: 06/02/2024 CLINICAL DATA:  Shortness of breath. EXAM: PORTABLE CHEST 1 VIEW COMPARISON:  11/09/2023. FINDINGS: Bilateral lung fields are clear. Bilateral costophrenic angles are clear. Stable cardio-mediastinal silhouette. No acute osseous abnormalities. The soft tissues are within normal limits. IMPRESSION: No active disease. Electronically Signed   By: Ree Molt M.D.   On: 06/02/2024 12:55        Scheduled Meds:  aspirin  EC  81 mg Oral Daily   atorvastatin   40 mg Oral QHS   azithromycin   250 mg  Oral Daily   docusate sodium   100 mg Oral BID   DULoxetine   60 mg Oral Daily   enoxaparin  (LOVENOX ) injection  0.5 mg/kg Subcutaneous Q24H   fluticasone  furoate-vilanterol  1 puff Inhalation Daily   guaiFENesin   1,200 mg Oral BID   losartan   100 mg Oral Daily  And   hydrochlorothiazide   25 mg Oral Daily   insulin  aspart  0-20 Units Subcutaneous TID WC   insulin  aspart  0-5 Units Subcutaneous QHS   ipratropium-albuterol   3 mL Inhalation Q4H   isosorbide  mononitrate  60 mg Oral Daily   levothyroxine   125 mcg Oral Q0600   methylPREDNISolone  (SOLU-MEDROL ) injection  40 mg Intravenous Q12H   Followed by   NOREEN ON 06/04/2024] predniSONE   40 mg Oral Q breakfast   metoprolol  succinate  25 mg Oral Daily   montelukast   10 mg Oral QHS   pantoprazole   40 mg Oral Daily   pregabalin   75 mg Oral BID   Continuous Infusions:   LOS: 0 days     Devaughn KATHEE Ban, MD [facility]   If 7PM-7AM, please contact night-coverage [email or web address] Password [email or web address] 06/03/2024, 10:13 AM

## 2024-06-03 NOTE — Plan of Care (Signed)

## 2024-06-03 NOTE — Significant Event (Signed)
       CROSS COVER NOTE  NAME: Belinda Garcia MRN: [id number] DOB : [birth year] ATTENDING PHYSICIAN: Laurita Cort DASEN, MD    Date of Service   06/03/2024   HPI/Events of Note   Paged from nurse with critical lactic acid of 5  Lab review   Latest Reference Range & Units 06/02/24 12:26 06/02/24 14:49 06/02/24 18:08 06/02/24 21:05  Lactic Acid, Venous 0.5 - 1.9 mmol/L 2.9 (HH) 3.6 (HH) 5.8 (HH) 5.0 (HH)  (HH): Data is critically high  Procal <0.10: UA without indications of UTI Covid an flu negative resp pcr panel still pending  73 yo female came from home for sob, wheezing and cough. Requiring NRB with sats 86% on arrival History COPD with frequent flare ups, IDDM (on metformin ),  HTN, obesity, and chronic back pain and tobacco abuse, PE, DVT right leg (not on oral anticoagulation), osteoarthritis,  reflux  esophagitis Treatment thus far for SIRS, COPD exacerbation  includes steroids, azithromycin , supplemental oxygen, 1500  ml fluid bolus Cardiac f/u with ECHO already ordered   Interventions   Assessment/Plan:    06/02/2024    9:21 PM 06/02/2024    4:54 PM 06/02/2024    2:30 PM  Vitals with BMI  Systolic 135 130 853  Diastolic 51 61 61  Pulse 103 100 99    Hold additional fluids ddimer Check hepatic function Discontinue metformin  and dont recommend restarting Continue to trend lactic acid levels Blood cultures        Belinda Garcia [facility] NP [facility] Cross Cover 7pm-7am - check amion for availability Pager [phone]

## 2024-06-04 DIAGNOSIS — J441 Chronic obstructive pulmonary disease with (acute) exacerbation: Secondary | ICD-10-CM | POA: Diagnosis not present

## 2024-06-04 LAB — GLUCOSE, CAPILLARY
Glucose-Capillary: 223 mg/dL — ABNORMAL HIGH (ref 70–99)
Glucose-Capillary: 263 mg/dL — ABNORMAL HIGH (ref 70–99)
Glucose-Capillary: 339 mg/dL — ABNORMAL HIGH (ref 70–99)
Glucose-Capillary: 328 mg/dL — ABNORMAL HIGH (ref 70–99)

## 2024-06-04 LAB — BASIC METABOLIC PANEL WITH GFR
Anion gap: 10 (ref 5–15)
BUN: 20 mg/dL (ref 8–23)
CO2: 26 mmol/L (ref 22–32)
Calcium: 8.8 mg/dL — ABNORMAL LOW (ref 8.9–10.3)
Chloride: 98 mmol/L (ref 98–111)
Creatinine, Ser: 0.83 mg/dL (ref 0.44–1.00)
GFR, Estimated: >60 mL/min (ref >60)
Glucose, Bld: 311 mg/dL — ABNORMAL HIGH (ref 70–99)
Potassium: 4.2 mmol/L (ref 3.5–5.1)
Sodium: 134 mmol/L — ABNORMAL LOW (ref 135–145)

## 2024-06-04 LAB — EXPECTORATED SPUTUM ASSESSMENT W GRAM STAIN, RFLX TO RESP C

## 2024-06-04 LAB — LACTIC ACID, PLASMA: Lactic Acid, Venous: 1.9 mmol/L (ref 0.5–1.9)

## 2024-06-04 LAB — GLUCOSE, RANDOM: Glucose, Bld: 355 mg/dL — ABNORMAL HIGH (ref 70–99)

## 2024-06-04 MED ORDER — INSULIN ASPART 100 UNIT/ML IJ SOLN
0.0000 [IU] | Freq: Three times a day (TID) | INTRAMUSCULAR | Status: DC
  Administered 2024-06-04: 11 [IU] via SUBCUTANEOUS
  Administered 2024-06-04: 8 [IU] via SUBCUTANEOUS
  Administered 2024-06-05: 3 [IU] via SUBCUTANEOUS
  Administered 2024-06-05: 15 [IU] via SUBCUTANEOUS
  Administered 2024-06-05: 5 [IU] via SUBCUTANEOUS
  Administered 2024-06-06: 3 [IU] via SUBCUTANEOUS
  Administered 2024-06-06: 15 [IU] via SUBCUTANEOUS
  Administered 2024-06-06: 8 [IU] via SUBCUTANEOUS
  Filled 2024-06-04 (×6): qty 1

## 2024-06-04 MED ORDER — SODIUM CHLORIDE 0.9 % IV SOLN: 1.0000 g | INTRAVENOUS | Status: DC

## 2024-06-04 MED ORDER — INSULIN GLARGINE 100 UNIT/ML ~~LOC~~ SOLN
15.0000 [IU] | Freq: Every day | SUBCUTANEOUS | Status: DC
  Administered 2024-06-04 – 2024-06-05 (×2): 15 [IU] via SUBCUTANEOUS
  Filled 2024-06-04 (×2): qty 0.15

## 2024-06-04 MED ORDER — SODIUM CHLORIDE 0.9 % IV SOLN: 500.0000 mg | INTRAVENOUS | Status: DC

## 2024-06-04 NOTE — Progress Notes (Signed)
 Progress Note   Patient: Belinda Garcia FMW:[id number] DOB: May 03, 1951 DOA: 06/02/2024     1 DOS: the patient was seen and examined on 06/04/2024   Brief hospital course: Belinda Garcia is a 73 y.o. female with medical history significant of COPD on as needed oxygen at home, HTN, HLD, IDDM, hypothyroidism, chronic pain on narcotics, obesity, presented with worsening of cough wheezing shortness of breath.   Patient claimed that she was hospitalized at [facility] February this year for double pneumonia.  Since then never got out of it she has been having frequent episodes of COPD flareup with cough wheezing shortness of breath.  This time, she started to have productive cough with clear phlegm 1 week ago, with wheezing and shortness of breath however 2 days ago, her cough became weak and dry, denied any chest pain no fever or chills.  Denies any smoking or passive smoking in the house.  Today, she is going to to see her pulmonology for follow-up however she felt shortness of breath and neighbor called EMS.  EMS arrived and found patient O2 saturation 86% and started patient on 15 L NRB.    Assessment and Plan:   Principal Problem:   COPD exacerbation (HCC) Active Problems:   COPD with acute exacerbation (HCC)   Obesity (BMI 30-39.9)   Asthma without status asthmaticus   Chronic low back pain   Fatty liver   Diabetes mellitus (HCC)   Hypertension   Pulmonary embolism (HCC)   Tobacco use disorder   # COPD with acute exacerbation Acute on chronic hypoxic respiratory failure Cough, dyspnea. Says has been treated with several rounds of antibiotics and steroids since February.  Was noted to have pulse oximetry of 86% and initially required 15 L oxygen via nonrebreather mask.  She has been weaned back down to her baseline 3 L and maintaining pulse oximetry greater than 92% Reports she doesn't have a controller inhaler at home, just has albuterol .  CXR clear. Does have hx of pe but  dimer not elevated for age. Bnp is mildly elevated. Respiratory panels neg. CT angiogram of the chest shows focal ground-glass opacities in the left upper lobe, favored to represent infection and/or inflammation. Patchy bibasilar opacities, possibly representing atelectasis/scarring although infection is difficult to exclude. Continue Levaquin  750 mg daily Continue systemic steroids Continue scheduled and as needed bronchodilator therapy Pulmonary consult      # Lactic acidosis May be 2/2 above 2 acute COPD exacerbation. Home metformin  may contribute Improved    # T2DM With hyperglycemia, steroids contributing - continue ssi resistant - Continue 5 units with meals and increase long-acting insulin  to 15 units - metformin  on hold since patient received IV contrast   # HTN Bp appropriate - Continue losartan  and metoprolol  - will hold home hydrochlorothiazide     # HFpEF Doesn't appear exacerbated - TTE pending - Continue imdur , metoprolol    # GAD - Continue valium    # Chronic pain - home duloxetine , pregabalin , tramadol    # Hyothyroid Recent tsh wnl - Continue levothyroxine    # Obesity BMI 33 Complicates overall prognosis and care Lifestyle modification and exercise discussed with patient    # Debility - Appreciate PT input -Recommend home health upon discharge         Subjective: Sitting up in a chair.  Continues to feel poorly.  Expectorating yellow phlegm  Physical Exam: Vitals:   06/03/24 2035 06/04/24 0135 06/04/24 0458 06/04/24 0818  BP: 119/60  (!) 161/77 (!) 164/72  Pulse: 92  73 80  Resp: 18  18 18   Temp: 97.9 F (36.6 C)  97.8 F (36.6 C) (!) 97.5 F (36.4 C)  TempSrc: Oral  Oral Oral  SpO2: 95% 95% 98% 99%  Weight:      Height:       General exam: Appears ill Respiratory system: Scattered rhonchi, faint exp wheeze Cardiovascular system: S1 & S2 heard, RRR. Soft systolic murmur Gastrointestinal system: Abdomen is obese, soft and  nontender.   Central nervous system: Alert and oriented. No focal neurological deficits. Extremities: Symmetric 5 x 5 power. Trace LE edema Skin: No rashes, lesions or ulcers Psychiatry: Judgement and insight appear normal. Mood & affect appropriate.        Data Reviewed: Sodium 134, glucose 311, hemoglobin A1c 8.1, lactic acid 1.3 Labs reviewed  Family Communication: Plan of care discussed with patient and her son at the bedside.  They verbalized understanding and agreed with the plan.  Disposition: Status is: Inpatient Remains inpatient appropriate because: Requires IV antibiotics  Planned Discharge Destination: Home    Time spent: 50 minutes  Author: Aimee Somerset, MD 06/04/2024 4:01 PM  For on call review [email or web address].

## 2024-06-04 NOTE — Plan of Care (Signed)

## 2024-06-04 NOTE — Evaluation (Signed)
 Physical Therapy Evaluation Patient Details Name: Belinda Garcia MRN: [id number] DOB: Oct 29, 1950 Today's Date: 06/04/2024  History of Present Illness  presented to ER secondary to cough, wheezing, SOB; admitted for management of AECOPD  Clinical Impression  Patient resting in bed upon arrival to room; son present at bedside. Patient alert and oriented, follows commands and agreeable to participation with session.  Endorses mild soreness in chest, related to recurrent coughing fits. Bilat UE/LE strength and ROM generally weak and deconditioned due to acute illness; however, grossly symmetrical and WFL for basic transfers and mobility.   Currently requiring min/mod assist for bed mobility; sup/mod indep for unsupported sitting edge of bed; min assist for sit/stand, SPT from bed/chair with RW.  Additional mobility deferred due to significant coughing with initiation of activity; unable to tolerate additional gait as result.   RN present at bedside; to discuss additional cough suppressant options with MD as appropriate.   Sats >95% on supplemental O2 via [place] throughout session. Would benefit from skilled PT to address above deficits and promote optimal return to PLOF.; recommend post-acute PT follow up as indicated by interdisciplinary care team.          If plan is discharge home, recommend the following: A little help with walking and/or transfers;A little help with bathing/dressing/bathroom   Can travel by private vehicle        Equipment Recommendations  (has RW, [facility])  Recommendations for Other Services       Functional Status Assessment Patient has had a recent decline in their functional status and demonstrates the ability to make significant improvements in function in a reasonable and predictable amount of time.     Precautions / Restrictions Precautions Precautions: Fall Restrictions Weight Bearing Restrictions Per Provider Order: No      Mobility  Bed Mobility Overal bed  mobility: Needs Assistance Bed Mobility: Supine to Sit     Supine to sit: Min assist, Mod assist     General bed mobility comments: assist for truncal elevation    Transfers Overall transfer level: Needs assistance Equipment used: Rolling walker (2 wheels) Transfers: Sit to/from Stand, Bed to chair/wheelchair/BSC Sit to Stand: Min assist Stand pivot transfers: Min assist              Ambulation/Gait               General Gait Details: deferred due to coughing episodes during session; unable to tolerate additional activity  Stairs            Wheelchair Mobility     Tilt Bed    Modified Rankin (Stroke Patients Only)       Balance Overall balance assessment: Needs assistance Sitting-balance support: No upper extremity supported, Feet supported Sitting balance-Leahy Scale: Good     Standing balance support: Bilateral upper extremity supported Standing balance-Leahy Scale: Fair                               Pertinent Vitals/Pain Pain Assessment Pain Assessment: No/denies pain    Home Living Family/patient expects to be discharged to:: Private residence Living Arrangements: Children Available Help at Discharge: Family Type of Home: House Home Access: Stairs to enter Entrance Stairs-Rails: Doctor, general practice of Steps: 4-5   Home Layout: Two level;Able to live on main level with bedroom/bathroom Home Equipment: [place] - single point;Rolling Walker (2 wheels)      Prior Function  Mobility Comments: Sup/mod indep with SPC for household mobilization, RW for community distances; denies fall history. Home O2 at 3L       Extremity/Trunk Assessment   Upper Extremity Assessment Upper Extremity Assessment: Generalized weakness    Lower Extremity Assessment Lower Extremity Assessment: Generalized weakness (grossly at least 4-/5 throughout; no focal weakness appreciated)       Communication    Communication Communication: No apparent difficulties    Cognition Arousal: Alert Behavior During Therapy: WFL for tasks assessed/performed   PT - Cognitive impairments: No apparent impairments                         Following commands: Intact       Cueing       General Comments      Exercises     Assessment/Plan    PT Assessment Patient needs continued PT services  PT Problem List Decreased strength;Decreased activity tolerance;Decreased balance;Decreased mobility;Decreased knowledge of use of DME;Decreased safety awareness;Decreased knowledge of precautions;Cardiopulmonary status limiting activity       PT Treatment Interventions DME instruction;Gait training;Stair training;Functional mobility training;Therapeutic activities;Therapeutic exercise;Balance training;Cognitive remediation;Patient/family education    PT Goals (Current goals can be found in the Care Plan section)  Acute Rehab PT Goals Patient Stated Goal: to make this coughing better PT Goal Formulation: With patient Time For Goal Achievement: 06/18/24 Potential to Achieve Goals: Good    Frequency Min 2X/week     Co-evaluation               AM-PAC PT 6 Clicks Mobility  Outcome Measure Help needed turning from your back to your side while in a flat bed without using bedrails?: A Little Help needed moving from lying on your back to sitting on the side of a flat bed without using bedrails?: A Lot Help needed moving to and from a bed to a chair (including a wheelchair)?: A Little Help needed standing up from a chair using your arms (e.g., wheelchair or bedside chair)?: A Little Help needed to walk in hospital room?: A Little Help needed climbing 3-5 steps with a railing? : A Lot 6 Click Score: 16    End of Session   Activity Tolerance: Patient tolerated treatment well Patient left: in chair;with call bell/phone within reach;with bed alarm set;with family/visitor present;with  nursing/sitter in room Nurse Communication: Mobility status PT Visit Diagnosis: Muscle weakness (generalized) (M62.81);Difficulty in walking, not elsewhere classified (R26.2)    Time: 8973-8954 PT Time Calculation (min) (ACUTE ONLY): 19 min   Charges:   PT Evaluation $PT Eval Moderate Complexity: 1 Mod   PT General Charges $$ ACUTE PT VISIT: 1 Visit         Jocabed Cheese H. Delores, PT, DPT, NCS 06/04/24, 10:55 AM [phone]

## 2024-06-04 NOTE — Care Management Important Message (Signed)
 Important Message  Patient Details  Name: LAURIANNE FLORESCA MRN: [id number] Date of Birth: 1951-08-27   Important Message Given:  Yes - Medicare IM     Rojelio [name] 06/04/2024, 2:26 PM

## 2024-06-05 DIAGNOSIS — J441 Chronic obstructive pulmonary disease with (acute) exacerbation: Secondary | ICD-10-CM | POA: Diagnosis not present

## 2024-06-05 LAB — GLUCOSE, CAPILLARY
Glucose-Capillary: 190 mg/dL — ABNORMAL HIGH (ref 70–99)
Glucose-Capillary: 242 mg/dL — ABNORMAL HIGH (ref 70–99)
Glucose-Capillary: 466 mg/dL — ABNORMAL HIGH (ref 70–99)
Glucose-Capillary: 493 mg/dL — ABNORMAL HIGH (ref 70–99)
Glucose-Capillary: 416 mg/dL — ABNORMAL HIGH (ref 70–99)
Glucose-Capillary: 453 mg/dL — ABNORMAL HIGH (ref 70–99)
Glucose-Capillary: 407 mg/dL — ABNORMAL HIGH (ref 70–99)

## 2024-06-05 LAB — BASIC METABOLIC PANEL WITH GFR
Anion gap: 7 (ref 5–15)
BUN: 22 mg/dL (ref 8–23)
CO2: 30 mmol/L (ref 22–32)
Calcium: 9.2 mg/dL (ref 8.9–10.3)
Chloride: 101 mmol/L (ref 98–111)
Creatinine, Ser: 0.7 mg/dL (ref 0.44–1.00)
GFR, Estimated: >60 mL/min (ref >60)
Glucose, Bld: 219 mg/dL — ABNORMAL HIGH (ref 70–99)
Potassium: 4.1 mmol/L (ref 3.5–5.1)
Sodium: 138 mmol/L (ref 135–145)

## 2024-06-05 MED ORDER — HYDROCHLOROTHIAZIDE 25 MG PO TABS
25.0000 mg | ORAL_TABLET | Freq: Every day | ORAL | Status: DC
  Administered 2024-06-06 – 2024-06-10 (×5): 25 mg via ORAL
  Filled 2024-06-05 (×5): qty 1

## 2024-06-05 MED ORDER — INSULIN GLARGINE 100 UNIT/ML ~~LOC~~ SOLN
20.0000 [IU] | Freq: Every day | SUBCUTANEOUS | Status: DC
  Administered 2024-06-06 – 2024-06-10 (×5): 20 [IU] via SUBCUTANEOUS
  Filled 2024-06-05 (×5): qty 0.2

## 2024-06-05 MED ORDER — FUROSEMIDE 10 MG/ML IJ SOLN
60.0000 mg | Freq: Once | INTRAMUSCULAR | Status: AC
  Administered 2024-06-05: 60 mg via INTRAVENOUS
  Filled 2024-06-05: qty 8

## 2024-06-05 MED ORDER — INSULIN GLARGINE 100 UNIT/ML ~~LOC~~ SOLN
5.0000 [IU] | Freq: Once | SUBCUTANEOUS | Status: AC
  Administered 2024-06-05: 5 [IU] via SUBCUTANEOUS
  Filled 2024-06-05: qty 0.05

## 2024-06-05 NOTE — Plan of Care (Signed)

## 2024-06-05 NOTE — Progress Notes (Signed)
 Progress Note   Patient: Belinda Garcia FMW:[id number] DOB: Feb 24, 1951 DOA: 06/02/2024     2 DOS: the patient was seen and examined on 06/05/2024   Brief hospital course:  Belinda Garcia is a 73 y.o. female with medical history significant of COPD on as needed oxygen at home, HTN, HLD, IDDM, hypothyroidism, chronic pain on narcotics, obesity, presented with worsening of cough wheezing shortness of breath.   Patient claimed that she was hospitalized at [facility] February this year for double pneumonia.  Since then never got out of it she has been having frequent episodes of COPD flareup with cough wheezing shortness of breath.  This time, she started to have productive cough with clear phlegm 1 week ago, with wheezing and shortness of breath however 2 days ago, her cough became weak and dry, denied any chest pain no fever or chills.  Denies any smoking or passive smoking in the house.  Today, she is going to to see her pulmonology for follow-up however she felt shortness of breath and neighbor called EMS.  EMS arrived and found patient O2 saturation 86% and started patient on 15 L NRB.     Assessment and Plan:  Principal Problem:   COPD exacerbation (HCC) Active Problems:   COPD with acute exacerbation (HCC)   Obesity (BMI 30-39.9)   Asthma without status asthmaticus   Chronic low back pain   Fatty liver   Diabetes mellitus (HCC)   Hypertension   Pulmonary embolism (HCC)   Tobacco use disorder   # COPD with acute exacerbation Acute on chronic hypoxic respiratory failure Cough, dyspnea. Per patient she has been treated with several rounds of antibiotics and steroids since February, 2025.  Was noted to have pulse oximetry of 86% and initially required 15 L oxygen via nonrebreather mask.  She has been weaned back down to her baseline 3 L and maintaining pulse oximetry greater than 92% CT angiogram of the chest showed focal ground-glass opacities in the left upper lobe, favored to  represent infection and/or inflammation. Patchy bibasilar opacities, possibly representing atelectasis/scarring although infection is difficult to exclude. Continue Levaquin  750 mg daily to complete a 5 - 7 day course of therapy Continue systemic steroids Continue scheduled and as needed bronchodilator therapy Appreciate Pulmonary input       # Lactic acidosis May be 2/2 above 2 acute COPD exacerbation. Home metformin  may contribute Improved     # T2DM With hyperglycemia, steroids contributing Improved Continue 5 units with meals and increase long-acting insulin  to 20 units Metformin  on hold since patient received IV contrast    # HTN Bp appropriate Continue losartan , hydrochlorothiazide  and metoprolol     # HFpEF Doesn't appear to be acutely exacerbated 2D echocardiogram shows a normal LVEF  of 60 to 65% with no regional wall motion abnormalities - Continue imdur , metoprolol  and hydrochlorothiazide    # GAD - Continue valium    # Chronic pain - home duloxetine , pregabalin , tramadol    # Hyothyroid Recent tsh wnl - Continue levothyroxine    # Obesity BMI 33 Complicates overall prognosis and care Lifestyle modification and exercise discussed with patient     # Debility - Appreciate PT input -Recommend home health upon discharge           Subjective: Feels better.  Continues to wheeze  Physical Exam: Vitals:   06/04/24 1947 06/05/24 0405 06/05/24 0422 06/05/24 0740  BP: (!) 157/78 (!) 177/75 (!) 160/72 (!) 162/71  Pulse: 80 79 80 73  Resp: 18 18  18 17  Temp: 98.2 F (36.8 C) 98.5 F (36.9 C) 97.9 F (36.6 C) 97.6 F (36.4 C)  TempSrc:      SpO2: 96% 93% 93% 93%  Weight:      Height:       General exam: Appears comfortable and in no distress Respiratory system: Scattered rhonchi, faint expiratory wheeze Cardiovascular system: S1 & S2 heard, RRR. Soft systolic murmur Gastrointestinal system: Abdomen is obese, soft and nontender.   Central nervous  system: Alert and oriented. No focal neurological deficits. Extremities: Symmetric 5 x 5 power. Trace LE edema Skin: No rashes, lesions or ulcers Psychiatry: Judgement and insight appear normal. Mood & affect appropriate.        Data Reviewed: Glucose 219, creatinine 0.70 Labs reviewed  Family Communication: Plan of care discussed with patient at the bedside.  She verbalizes understanding and agrees with the plan  Disposition: Status is: Inpatient Remains inpatient appropriate because: Continues to require IV antibiotics  Planned Discharge Destination: Home with Home Health    Time spent: 35 minutes  Author: Aimee Somerset, MD 06/05/2024 12:22 PM  For on call review [email or web address].
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
## 2024-06-05 NOTE — Plan of Care (Signed)
  Problem: Nutritional: Goal: Maintenance of adequate nutrition will improve Outcome: Progressing   Problem: Skin Integrity: Goal: Risk for impaired skin integrity will decrease Outcome: Progressing   Problem: Nutrition: Goal: Adequate nutrition will be maintained Outcome: Progressing   Problem: Coping: Goal: Level of anxiety will decrease Outcome: Progressing   Problem: Elimination: Goal: Will not experience complications related to bowel motility Outcome: Progressing Goal: Will not experience complications related to urinary retention Outcome: Progressing   Problem: Pain Managment: Goal: General experience of comfort will improve and/or be controlled Outcome: Progressing

## 2024-06-05 NOTE — Progress Notes (Signed)
 At approximately 1630, patient's blood sugar was taken for pre-meal reading. Resulted as 493 mg/dL. Immediate recheck from another finger resulted at 466 mg/dL. MD notified per protocol for orders at 1640. 1641, MD inquired SSI dosage to be given, and RN explained that there is no listed amount, but that the SS indicates need to notify MD. New order for insulin  glargine 20 units to be given daily. No other orders. Notified MD at 1644 that current SSI order indicates that 15 units of short-acting insulin  should be given for blood glucose of 351-400. No new orders at that time. MD notified at 1706 to inquire if MD would like RN to give current maximum SSI dose of 15 units along with scheduled 5 units for meal coverage, to which MD stated yes. SSI plus meal coverage given at 1716. Blood glucose was rechecked at 1749 with result of 453 mg/dL. Blood glucose was rechecked at 1805 with result of 416 mg/dL. MD was notified to make aware; MD stated that patient had received 20 units of novolog  and 5 units of lantus . RN stated there was no order for 5 units of lantus /insulin  glargine. New order for 5 units of insulin  glargine to be given at 2000. MD also requested recheck of blood glucose in approximately one hour. Report given to Jeoffrey Crank, RN for transfer of care.

## 2024-06-06 ENCOUNTER — Inpatient Hospital Stay

## 2024-06-06 DIAGNOSIS — J441 Chronic obstructive pulmonary disease with (acute) exacerbation: Secondary | ICD-10-CM | POA: Diagnosis not present

## 2024-06-06 LAB — BASIC METABOLIC PANEL WITH GFR
Anion gap: 11 (ref 5–15)
BUN: 28 mg/dL — ABNORMAL HIGH (ref 8–23)
CO2: 31 mmol/L (ref 22–32)
Calcium: 9.2 mg/dL (ref 8.9–10.3)
Chloride: 96 mmol/L — ABNORMAL LOW (ref 98–111)
Creatinine, Ser: 0.79 mg/dL (ref 0.44–1.00)
GFR, Estimated: >60 mL/min (ref >60)
Glucose, Bld: 202 mg/dL — ABNORMAL HIGH (ref 70–99)
Potassium: 3.8 mmol/L (ref 3.5–5.1)
Sodium: 138 mmol/L (ref 135–145)

## 2024-06-06 LAB — GLUCOSE, CAPILLARY
Glucose-Capillary: 188 mg/dL — ABNORMAL HIGH (ref 70–99)
Glucose-Capillary: 297 mg/dL — ABNORMAL HIGH (ref 70–99)
Glucose-Capillary: 258 mg/dL — ABNORMAL HIGH (ref 70–99)
Glucose-Capillary: 376 mg/dL — ABNORMAL HIGH (ref 70–99)
Glucose-Capillary: 471 mg/dL — ABNORMAL HIGH (ref 70–99)
Glucose-Capillary: 555 mg/dL (ref 70–99)
Glucose-Capillary: 350 mg/dL — ABNORMAL HIGH (ref 70–99)

## 2024-06-06 MED ORDER — INSULIN ASPART 100 UNIT/ML IJ SOLN
0.0000 [IU] | Freq: Three times a day (TID) | INTRAMUSCULAR | Status: DC
  Administered 2024-06-06: 15 [IU] via SUBCUTANEOUS
  Administered 2024-06-07: 7 [IU] via SUBCUTANEOUS
  Administered 2024-06-07: 11 [IU] via SUBCUTANEOUS
  Administered 2024-06-07: 3 [IU] via SUBCUTANEOUS
  Administered 2024-06-07: 20 [IU] via SUBCUTANEOUS
  Administered 2024-06-08: 11 [IU] via SUBCUTANEOUS
  Administered 2024-06-08: 15 [IU] via SUBCUTANEOUS
  Administered 2024-06-08: 3 [IU] via SUBCUTANEOUS
  Administered 2024-06-08: 15 [IU] via SUBCUTANEOUS
  Administered 2024-06-09: 4 [IU] via SUBCUTANEOUS
  Administered 2024-06-09: 7 [IU] via SUBCUTANEOUS
  Administered 2024-06-09: 15 [IU] via SUBCUTANEOUS
  Administered 2024-06-09: 20 [IU] via SUBCUTANEOUS
  Administered 2024-06-10: 4 [IU] via SUBCUTANEOUS
  Administered 2024-06-10: 7 [IU] via SUBCUTANEOUS
  Filled 2024-06-06 (×15): qty 1

## 2024-06-06 MED ORDER — INSULIN ASPART 100 UNIT/ML IJ SOLN
20.0000 [IU] | Freq: Once | INTRAMUSCULAR | Status: AC
  Administered 2024-06-06: 20 [IU] via SUBCUTANEOUS
  Filled 2024-06-06: qty 1

## 2024-06-06 MED ORDER — BUDESONIDE 0.25 MG/2ML IN SUSP
0.2500 mg | Freq: Two times a day (BID) | RESPIRATORY_TRACT | Status: DC
  Administered 2024-06-06 – 2024-06-10 (×9): 0.25 mg via RESPIRATORY_TRACT
  Filled 2024-06-06 (×9): qty 2

## 2024-06-06 MED ORDER — INSULIN ASPART 100 UNIT/ML IJ SOLN
8.0000 [IU] | Freq: Three times a day (TID) | INTRAMUSCULAR | Status: DC
  Administered 2024-06-06 – 2024-06-08 (×6): 8 [IU] via SUBCUTANEOUS
  Filled 2024-06-06 (×6): qty 1

## 2024-06-06 MED ORDER — IPRATROPIUM-ALBUTEROL 0.5-2.5 (3) MG/3ML IN SOLN
3.0000 mL | Freq: Three times a day (TID) | RESPIRATORY_TRACT | Status: DC
  Administered 2024-06-06 – 2024-06-10 (×11): 3 mL via RESPIRATORY_TRACT
  Filled 2024-06-06 (×11): qty 3

## 2024-06-06 NOTE — Progress Notes (Signed)
 Progress Note   Patient: Belinda Garcia FMW:[id number] DOB: 19-Jul-1951 DOA: 06/02/2024     3 DOS: the patient was seen and examined on 06/06/2024   Brief hospital course:  Belinda Garcia is a 73 y.o. female with medical history significant of COPD on as needed oxygen at home, HTN, HLD, IDDM, hypothyroidism, chronic pain on narcotics, obesity, presented with worsening of cough wheezing shortness of breath.   Patient claimed that she was hospitalized at [facility] February this year for double pneumonia.  Since then never got out of it she has been having frequent episodes of COPD flareup with cough wheezing shortness of breath.  This time, she started to have productive cough with clear phlegm 1 week ago, with wheezing and shortness of breath however 2 days ago, her cough became weak and dry, denied any chest pain no fever or chills.  Denies any smoking or passive smoking in the house.  Today, she is going to to see her pulmonology for follow-up however she felt shortness of breath and neighbor called EMS.  EMS arrived and found patient O2 saturation 86% and started patient on 15 L NRB.    Assessment and Plan:   Principal Problem:   COPD exacerbation (HCC) Active Problems:   COPD with acute exacerbation (HCC)   Obesity (BMI 30-39.9)   Asthma without status asthmaticus   Chronic low back pain   Fatty liver   Diabetes mellitus (HCC)   Hypertension   Pulmonary embolism (HCC)   Tobacco use disorder   # COPD with acute exacerbation Acute on chronic hypoxic respiratory failure Cough, dyspnea. Per patient she has been treated with several rounds of antibiotics and steroids since February, 2025.  Was noted to have pulse oximetry of 86% and initially required 15 L oxygen via nonrebreather mask.  She has been weaned back down to her baseline 3 L and maintaining pulse oximetry greater than 92% CT angiogram of the chest showed focal ground-glass opacities in the left upper lobe, favored to  represent infection and/or inflammation. Patchy bibasilar opacities, possibly representing atelectasis/scarring although infection is difficult to exclude. Continue Levaquin  750 mg daily to complete a 5 - 7 day course of therapy Continue systemic steroids Continue scheduled and as needed bronchodilator therapy Appreciate Pulmonary input       # Lactic acidosis May be 2/2 above 2 acute COPD exacerbation. Home metformin  may have also contributed Improved     # T2DM With hyperglycemia, steroids contributing Improved Increase Premeal insulin  to 8 units with meals and continue long-acting insulin  at 20 units Metformin  on hold since patient received IV contrast     # HTN Bp appropriate Continue losartan , hydrochlorothiazide  and metoprolol      # HFpEF Doesn't appear to be acutely exacerbated 2D echocardiogram shows a normal LVEF  of 60 to 65% with no regional wall motion abnormalities - Continue imdur , metoprolol  and hydrochlorothiazide    # GAD - Continue valium    # Chronic pain - home duloxetine , pregabalin , tramadol    # Hyothyroid Recent tsh wnl - Continue levothyroxine    # Obesity BMI 33 Complicates overall prognosis and care Lifestyle modification and exercise discussed with patient     # Debility - Appreciate PT input -Recommend home health upon discharge               Subjective: Continues to cough yellow phlegm  Physical Exam: Vitals:   06/06/24 0400 06/06/24 0726 06/06/24 1116 06/06/24 1345  BP: (!) 150/66 (!) 171/79 (!) 146/82  Pulse: 75 71 70   Resp: 18 17    Temp: 98.6 F (37 C) 97.6 F (36.4 C)    TempSrc:  Oral    SpO2: 91% 91%  92%  Weight:      Height:       General exam: Appears comfortable and in no distress Respiratory system: Scattered rhonchi, faint expiratory wheeze Cardiovascular system: S1 & S2 heard, RRR. Soft systolic murmur Gastrointestinal system: Abdomen is obese, soft and nontender.   Central nervous system: Alert  and oriented. No focal neurological deficits. Extremities: Symmetric 5 x 5 power. Trace LE edema Skin: No rashes, lesions or ulcers Psychiatry: Judgement and insight appear normal. Mood & affect appropriate.      Data Reviewed: Glucose 202, BUN 28, creatinine 0.7  Labs reviewed  Family Communication: Plan of care discussed with patient at the bedside.  She verbalizes understanding and agrees with the plan  Disposition: Status is: Inpatient Remains inpatient appropriate because: Discharge planning  Planned Discharge Destination: Home with home health    Time spent: 35 minutes  Author: Aimee Somerset, MD 06/06/2024 2:05 PM  For on call review [email or web address].

## 2024-06-06 NOTE — Plan of Care (Signed)
  Problem: Education: Goal: Ability to describe self-care measures that may prevent or decrease complications (Diabetes Survival Skills Education) will improve Outcome: Progressing   Problem: Pain Managment: Goal: General experience of comfort will improve and/or be controlled Outcome: Progressing   Problem: Safety: Goal: Ability to remain free from injury will improve Outcome: Progressing   Problem: Skin Integrity: Goal: Risk for impaired skin integrity will decrease Outcome: Progressing

## 2024-06-07 DIAGNOSIS — J441 Chronic obstructive pulmonary disease with (acute) exacerbation: Secondary | ICD-10-CM | POA: Diagnosis not present

## 2024-06-07 LAB — GLUCOSE, CAPILLARY
Glucose-Capillary: 211 mg/dL — ABNORMAL HIGH (ref 70–99)
Glucose-Capillary: 216 mg/dL — ABNORMAL HIGH (ref 70–99)
Glucose-Capillary: 150 mg/dL — ABNORMAL HIGH (ref 70–99)
Glucose-Capillary: 447 mg/dL — ABNORMAL HIGH (ref 70–99)
Glucose-Capillary: 539 mg/dL (ref 70–99)
Glucose-Capillary: 287 mg/dL — ABNORMAL HIGH (ref 70–99)

## 2024-06-07 LAB — CBC
HCT: 41.9 % (ref 36.0–46.0)
Hemoglobin: 13.9 g/dL (ref 12.0–15.0)
MCH: 32.5 pg (ref 26.0–34.0)
MCHC: 33.2 g/dL (ref 30.0–36.0)
MCV: 97.9 fL (ref 80.0–100.0)
Platelets: 303 K/uL (ref 150–400)
RBC: 4.28 MIL/uL (ref 3.87–5.11)
RDW: 13.2 % (ref 11.5–15.5)
WBC: 15.6 K/uL — ABNORMAL HIGH (ref 4.0–10.5)
nRBC: 0 % (ref 0.0–0.2)

## 2024-06-07 LAB — CULTURE, RESPIRATORY W GRAM STAIN: Culture: NORMAL

## 2024-06-07 LAB — BASIC METABOLIC PANEL WITH GFR
Anion gap: 13 (ref 5–15)
BUN: 31 mg/dL — ABNORMAL HIGH (ref 8–23)
CO2: 31 mmol/L (ref 22–32)
Calcium: 9.2 mg/dL (ref 8.9–10.3)
Chloride: 96 mmol/L — ABNORMAL LOW (ref 98–111)
Creatinine, Ser: 0.86 mg/dL (ref 0.44–1.00)
GFR, Estimated: >60 mL/min (ref >60)
Glucose, Bld: 192 mg/dL — ABNORMAL HIGH (ref 70–99)
Potassium: 4 mmol/L (ref 3.5–5.1)
Sodium: 140 mmol/L (ref 135–145)

## 2024-06-07 MED ORDER — PREDNISONE 10 MG PO TABS: 10.0000 mg | ORAL_TABLET | Freq: Every day | ORAL | Status: DC

## 2024-06-07 MED ORDER — PREDNISONE 20 MG PO TABS
30.0000 mg | ORAL_TABLET | Freq: Every day | ORAL | Status: AC
  Administered 2024-06-08: 30 mg via ORAL
  Filled 2024-06-07: qty 1

## 2024-06-07 MED ORDER — PREDNISONE 10 MG PO TABS: 15.0000 mg | ORAL_TABLET | Freq: Every day | ORAL | Status: DC

## 2024-06-07 MED ORDER — PREDNISONE 50 MG PO TABS
35.0000 mg | ORAL_TABLET | Freq: Every day | ORAL | Status: AC
  Administered 2024-06-07: 35 mg via ORAL
  Filled 2024-06-07: qty 1

## 2024-06-07 MED ORDER — INSULIN ASPART 100 UNIT/ML IJ SOLN
10.0000 [IU] | Freq: Once | INTRAMUSCULAR | Status: AC
  Administered 2024-06-07: 10 [IU] via SUBCUTANEOUS
  Filled 2024-06-07: qty 1

## 2024-06-07 MED ORDER — PREDNISONE 50 MG PO TABS
25.0000 mg | ORAL_TABLET | Freq: Every day | ORAL | Status: AC
  Administered 2024-06-09: 25 mg via ORAL
  Filled 2024-06-07: qty 1

## 2024-06-07 MED ORDER — FUROSEMIDE 10 MG/ML IJ SOLN
40.0000 mg | Freq: Once | INTRAMUSCULAR | Status: AC
  Administered 2024-06-07: 40 mg via INTRAVENOUS
  Filled 2024-06-07: qty 4

## 2024-06-07 MED ORDER — PREDNISONE 20 MG PO TABS
20.0000 mg | ORAL_TABLET | Freq: Every day | ORAL | Status: AC
  Administered 2024-06-10: 20 mg via ORAL
  Filled 2024-06-07: qty 1

## 2024-06-07 NOTE — Progress Notes (Signed)
 Progress Note   Patient: Belinda Garcia FMW:[id number] DOB: [birth year] DOA: 06/02/2024     4 DOS: the patient was seen and examined on 06/07/2024   Brief hospital course:  Belinda Garcia is a 73 y.o. female with medical history significant of COPD on as needed oxygen at home, HTN, HLD, IDDM, hypothyroidism, chronic pain on narcotics, obesity, presented with worsening of cough wheezing shortness of breath.   Patient claimed that she was hospitalized at [facility] February this year for double pneumonia.  Since then never got out of it she has been having frequent episodes of COPD flareup with cough wheezing shortness of breath.  This time, she started to have productive cough with clear phlegm 1 week ago, with wheezing and shortness of breath however 2 days ago, her cough became weak and dry, denied any chest pain no fever or chills.  Denies any smoking or passive smoking in the house.  Today, she is going to to see her pulmonology for follow-up however she felt shortness of breath and neighbor called EMS.  EMS arrived and found patient O2 saturation 86% and started patient on 15 L NRB.    Assessment and Plan:  Principal Problem:   COPD exacerbation (HCC) Active Problems:   COPD with acute exacerbation (HCC)   Obesity (BMI 30-39.9)   Asthma without status asthmaticus   Chronic low back pain   Fatty liver   Diabetes mellitus (HCC)   Hypertension   Pulmonary embolism (HCC)   Tobacco use disorder   # COPD with acute exacerbation Acute on chronic hypoxic respiratory failure Cough, dyspnea. Per patient she has been treated with several rounds of antibiotics and steroids since February, 2025.  Was noted to have pulse oximetry of 86% on room air  and initially required 15 L oxygen via nonrebreather mask.  She has been weaned back down to her baseline 2 -3 L and maintaining pulse oximetry greater than 92% CT angiogram of the chest showed focal ground-glass opacities in the left upper  lobe, favored to represent infection and/or inflammation. Patchy bibasilar opacities, possibly representing atelectasis/scarring although infection is difficult to exclude. Continue Levaquin  750 mg daily to complete a 5 - 7 day course of therapy Continue systemic steroids Continue scheduled and as needed bronchodilator therapy Appreciate Pulmonary input.  Recommends to taper prednisone  by 5 mg daily upon discharge Patient is on home oxygen as needed but will likely require continuous home O2 upon discharge since her room air pulse oximetry was 86%.       # Lactic acidosis May be 2/2 above 2 acute COPD exacerbation. Home metformin  may have also contributed Improved     # T2DM With hyperglycemia, steroids contributing Improved Continue Premeal insulin  8 units with meals and continue long-acting insulin  at 20 units Metformin  on hold since patient received IV contrast     # HTN Bp appropriate Continue losartan , hydrochlorothiazide  and metoprolol      # HFpEF Doesn't appear to be acutely exacerbated 2D echocardiogram shows a normal LVEF  of 60 to 65% with no regional wall motion abnormalities - Continue imdur , metoprolol  and hydrochlorothiazide     # GAD - Continue valium    # Chronic pain - home duloxetine , pregabalin , tramadol    # Hyothyroid Recent tsh wnl - Continue levothyroxine    # Obesity BMI 33 Complicates overall prognosis and care Lifestyle modification and exercise discussed with patient     # Debility - Appreciate PT input -Recommend skilled nursing facility upon discharge [facility] consulted  Subjective: Feels better but continues to have a cough productive of yellow phlegm  Physical Exam: Vitals:   06/06/24 1947 06/06/24 2021 06/07/24 0434 06/07/24 0759  BP:  (!) 150/66 (!) 119/58 (!) 152/66  Pulse:  91 72 74  Resp:  20 16 14   Temp:  97.7 F (36.5 C) 97.6 F (36.4 C) 97.7 F (36.5 C)  TempSrc:  Oral Oral Oral  SpO2: 93% 92% 94% 94%   Weight:      Height:       General exam: Appears comfortable and in no distress Respiratory system: Scattered rhonchi, faint expiratory wheeze Cardiovascular system: S1 & S2 heard, RRR. Soft systolic murmur Gastrointestinal system: Abdomen is obese, soft and nontender.   Central nervous system: Alert and oriented. No focal neurological deficits. Extremities: Symmetric 5 x 5 power. Trace LE edema Skin: No rashes, lesions or ulcers Psychiatry: Judgement and insight appear normal. Mood & affect appropriate.     Data Reviewed: White count 15.6, glucose 192, BUN 31, creatinine 0.86 Labs reviewed  Family Communication: Plan of care discussed with the patient at the bedside.  She verbalizes understanding and agrees with the plan.  Disposition: Status is: Inpatient Remains inpatient appropriate because: Awaiting placement  Planned Discharge Destination: Skilled nursing facility    Time spent: 35 minutes  Author: Aimee Somerset, MD 06/07/2024 2:31 PM  For on call review [email or web address].

## 2024-06-07 NOTE — Progress Notes (Signed)
 Mobility Specialist Progress Note:    06/07/24 1604  Mobility  Activity Ambulated with assistance  Level of Assistance Minimal assist, patient does 75% or more  Assistive Device Front wheel walker  Distance Ambulated (ft) 25 ft  Range of Motion/Exercises Active;All extremities  Activity Response Tolerated well  Mobility visit 1 Mobility  Mobility Specialist Start Time (ACUTE ONLY) 1543  Mobility Specialist Stop Time (ACUTE ONLY) 1601  Mobility Specialist Time Calculation (min) (ACUTE ONLY) 18 min   Pt received in chair, son in room. Agreeable to mobility, required MinA to stand and ambulate with RW. Tolerated well, expresses fear of falling during session. Audible SOB, SpO2 90% on 2L after session. Returned pt to chair, belongings in reach. All needs met.   Sherrilee Ditty Mobility Specialist Please contact via Special educational needs teacher or  Rehab office at [phone]

## 2024-06-07 NOTE — Inpatient Diabetes Management (Signed)
 Inpatient Diabetes Program Recommendations  AACE/ADA: New Consensus Statement on Inpatient Glycemic Control   Target Ranges:  Prepandial:   less than 140 mg/dL      Peak postprandial:   less than 180 mg/dL (1-2 hours)      Critically ill patients:  140 - 180 mg/dL    Latest Reference Range & Units 06/07/24 04:11 06/07/24 08:03  Glucose-Capillary 70 - 99 mg/dL 788 (H) 783 (H)    Latest Reference Range & Units 06/06/24 07:27 06/06/24 11:17 06/06/24 12:19 06/06/24 16:39 06/06/24 20:31 06/06/24 20:36 06/06/24 22:41  Glucose-Capillary 70 - 99 mg/dL 811 (H) 702 (H) 741 (H) 376 (H) 555 (HH) 471 (H) 350 (H)   Review of Glycemic Control  Diabetes history: DM2 Outpatient Diabetes medications: Metaglip  5/500 mg 1 tablet BID,Lantus  6 units at HS for CBG >200 Current orders for Inpatient glycemic control: Lantus  20 units daily, Novolog  8 units TID with  meals, Novolog  0-20 units AC&HS; Prednisone  30 mg QAM (tapering)  Inpatient Diabetes Program Recommendations:    Insulin : CBG 216 mg/dl this morning and CBG up to 555 mg/dl last night.   If steroids are continued, please consider increasing Lantus  to 27 units daily and meal coverage to Novolog  12 units TID with meals.  Thanks, Earnie Gainer, RN, MSN, CDCES Diabetes Coordinator Inpatient Diabetes Program [phone] (Team Pager from 8am to 5pm)

## 2024-06-07 NOTE — Progress Notes (Incomplete)
 Pt blood sugar 555, 471 on recheck.
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
## 2024-06-07 NOTE — Progress Notes (Signed)
 Physical Therapy Treatment Patient Details Name: Belinda Garcia MRN: [id number] DOB: Aug 05, 1951 Today's Date: 06/07/2024   History of Present Illness presented to ER secondary to cough, wheezing, SOB; admitted for management of AECOPD    PT Comments  Pt demonstrates decreased balance and activity tolerance this PT session. Pt able to ambulate 71ft around room, however demonstrated 1 LOB when turning around bed. SPT provided min A for balance correction. Pt required 1 seated rest break at EOB d/t fatigue. Min A for RW management, as patient has tendency to walk with RW far in front. SpO2 monitored throughout session and stayed >90% throughout with pt on 3L of O2. Pt is not at her baseline function and would benefit from skilled PT to address above deficits and promote optimal return to PLOF.    If plan is discharge home, recommend the following: A little help with walking and/or transfers;A little help with bathing/dressing/bathroom   Can travel by private vehicle     Yes  Equipment Recommendations  Other (comment) (TBD at next venue of care. Pt has RW and [facility].)    Recommendations for Other Services       Precautions / Restrictions Precautions Precautions: Fall Recall of Precautions/Restrictions: Intact Restrictions Weight Bearing Restrictions Per Provider Order: No     Mobility  Bed Mobility Overal bed mobility: Needs Assistance             General bed mobility comments: NT. Pt received in recliner pre/post session.    Transfers Overall transfer level: Needs assistance Equipment used: Rolling walker (2 wheels) Transfers: Sit to/from Stand, Bed to chair/wheelchair/BSC Sit to Stand: Min assist   Step pivot transfers: Min assist       General transfer comment: Min A with eccntric phase of STS. Shaky with standing up from EOB but regains stability with prolonged standing using RW. Pt leans on RW with forward flexed posture which she was able to correct with verbal  cuing.    Ambulation/Gait Ambulation/Gait assistance: Min assist Gait Distance (Feet): 30 Feet Assistive device: Rolling walker (2 wheels) Gait Pattern/deviations: Step-through pattern Gait velocity: decreased     General Gait Details: 1 instance of LOB with turn to the left around the bed, min A for correction. Noted increase in RR with ambulation around the bed with pt requiring seated rest break at EOB. Verbal cuing for sequencing. Min A for Rw management.   Stairs             Wheelchair Mobility     Tilt Bed    Modified Rankin (Stroke Patients Only)       Balance Overall balance assessment: Needs assistance Sitting-balance support: No upper extremity supported, Feet supported Sitting balance-Leahy Scale: Good Sitting balance - Comments: No LOB with sitting at recliner.   Standing balance support: Bilateral upper extremity supported, During functional activity, Reliant on assistive device for balance Standing balance-Leahy Scale: Poor Standing balance comment: Pt demonstrates shakiness once standing and has heavily rely on the RW to gain stability. Verbal cuing for posture correction.                            Communication Communication Communication: No apparent difficulties  Cognition Arousal: Alert Behavior During Therapy: WFL for tasks assessed/performed   PT - Cognitive impairments: No apparent impairments                       PT - Cognition Comments:  Pt is pleasant and agreeable to PT session. Family is present during session. Following commands: Intact      Cueing Cueing Techniques: Verbal cues, Visual cues  Exercises Other Exercises Other Exercises: Edu about SpO2 norms    General Comments General comments (skin integrity, edema, etc.): 3L of O2 upon entering room.SpO2 >90% throughout session.      Pertinent Vitals/Pain Pain Assessment Pain Assessment: No/denies pain    Home Living                           Prior Function            PT Goals (current goals can now be found in the care plan section) Acute Rehab PT Goals Patient Stated Goal: none stated PT Goal Formulation: With patient Time For Goal Achievement: 06/18/24 Potential to Achieve Goals: Good Progress towards PT goals: Progressing toward goals    Frequency    Min 2X/week      PT Plan      Co-evaluation              AM-PAC PT 6 Clicks Mobility   Outcome Measure  Help needed turning from your back to your side while in a flat bed without using bedrails?: A Little Help needed moving from lying on your back to sitting on the side of a flat bed without using bedrails?: A Lot Help needed moving to and from a bed to a chair (including a wheelchair)?: A Little Help needed standing up from a chair using your arms (e.g., wheelchair or bedside chair)?: A Little Help needed to walk in hospital room?: A Little Help needed climbing 3-5 steps with a railing? : A Lot 6 Click Score: 16    End of Session Equipment Utilized During Treatment: Gait belt;Oxygen Activity Tolerance: Patient tolerated treatment well;Patient limited by fatigue Patient left: in chair;with call bell/phone within reach;with bed alarm set;with family/visitor present;with nursing/sitter in room Nurse Communication: Mobility status PT Visit Diagnosis: Muscle weakness (generalized) (M62.81);Difficulty in walking, not elsewhere classified (R26.2)     Time: 8961-8940 PT Time Calculation (min) (ACUTE ONLY): 21 min  Charges:                            Nayef College, SPT    Milburn Freeney 06/07/2024, 11:19 AM

## 2024-06-07 NOTE — Evaluation (Signed)
 Occupational Therapy Evaluation Patient Details Name: Belinda Garcia MRN: [id number] DOB: Jan 28, 1951 Today's Date: 06/07/2024   History of Present Illness   73 y.o. female with medical history significant of COPD on as needed oxygen at home, HTN, HLD, IDDM, hypothyroidism, chronic pain on narcotics, obesity, presented with worsening of cough wheezing shortness of breath.   Clinical Impressions Belinda Garcia was seen for OT evaluation this date. Prior to hospital admission, pt was MOD I using SPC in house, RW in community and 3L [place] as needed. Pt lives in 2 level house with 6 STE. Pt currently requires MIN A exit bed and sit<>stand. MIN A don underwear. CGA + RW for ADL t/f ~15 ft. SpO2 91% on 3L [place] with activity. Pt fatigued quickly with activity, appears fearful of falling. Educated on IS and flutter valve. Pt would benefit from skilled OT to address noted impairments and functional limitations (see below for any additional details). Upon hospital discharge, recommend OT follow up <3 hours/day.    If plan is discharge home, recommend the following:   A little help with walking and/or transfers;A little help with bathing/dressing/bathroom;Help with stairs or ramp for entrance     Functional Status Assessment   Patient has had a recent decline in their functional status and demonstrates the ability to make significant improvements in function in a reasonable and predictable amount of time.     Equipment Recommendations   BSC/3in1     Recommendations for Other Services         Precautions/Restrictions   Precautions Precautions: Fall Recall of Precautions/Restrictions: Intact Restrictions Weight Bearing Restrictions Per Provider Order: No     Mobility Bed Mobility Overal bed mobility: Needs Assistance Bed Mobility: Supine to Sit     Supine to sit: Min assist          Transfers Overall transfer level: Needs assistance Equipment used: Rolling walker (2  wheels) Transfers: Sit to/from Stand Sit to Stand: Min assist                  Balance Overall balance assessment: Needs assistance Sitting-balance support: No upper extremity supported, Feet supported Sitting balance-Leahy Scale: Fair Sitting balance - Comments: fearful Postural control: Posterior lean Standing balance support: Bilateral upper extremity supported Standing balance-Leahy Scale: Fair                             ADL either performed or assessed with clinical judgement   ADL Overall ADL's : Needs assistance/impaired                                       General ADL Comments: MIN A don underwear. CGA + RW for ADL t/f.     Vision         Perception         Praxis         Pertinent Vitals/Pain Pain Assessment Pain Assessment: No/denies pain     Extremity/Trunk Assessment Upper Extremity Assessment Upper Extremity Assessment: Generalized weakness   Lower Extremity Assessment Lower Extremity Assessment: Generalized weakness       Communication Communication Communication: No apparent difficulties   Cognition Arousal: Alert Behavior During Therapy: Anxious Cognition: No apparent impairments  Following commands: Intact       Cueing  General Comments   Cueing Techniques: Verbal cues;Visual cues  SpO2 91% on 3L [place] with activity   Exercises     Shoulder Instructions      Home Living Family/patient expects to be discharged to:: Private residence Living Arrangements: Children Available Help at Discharge: Family;Available PRN/intermittently Type of Home: House Home Access: Stairs to enter [employer] of Steps: 6 Entrance Stairs-Rails: Left;Right Home Layout: Two level;Able to live on main level with bedroom/bathroom     Bathroom Shower/Tub: Walk-in shower         Home Equipment: Cane - single Librarian, academic (2 wheels);Shower seat           Prior Functioning/Environment Prior Level of Function : Needs assist;History of Falls (last six months)             Mobility Comments: reports 6 falls in last 6 months. PRN 3L [place] baseline. Assist for stairs      OT Problem List: Decreased strength;Decreased range of motion;Decreased activity tolerance;Impaired balance (sitting and/or standing);Decreased safety awareness   OT Treatment/Interventions: Self-care/ADL training;Therapeutic exercise;Energy conservation;DME and/or AE instruction;Therapeutic activities;Balance training;Patient/family education      OT Goals(Current goals can be found in the care plan section)   Acute Rehab OT Goals Patient Stated Goal: to go to rehab OT Goal Formulation: With patient/family Time For Goal Achievement: 06/21/24 Potential to Achieve Goals: Good ADL Goals Pt Will Perform Grooming: with modified independence;standing Pt Will Perform Lower Body Dressing: with modified independence;sit to/from stand Pt Will Transfer to Toilet: with modified independence;ambulating;regular height toilet   OT Frequency:  Min 3X/week    Co-evaluation              AM-PAC OT 6 Clicks Daily Activity     Outcome Measure Help from another person eating meals?: None Help from another person taking care of personal grooming?: A Little Help from another person toileting, which includes using toliet, bedpan, or urinal?: A Little Help from another person bathing (including washing, rinsing, drying)?: A Lot Help from another person to put on and taking off regular upper body clothing?: A Little Help from another person to put on and taking off regular lower body clothing?: A Lot 6 Click Score: 17   End of Session Equipment Utilized During Treatment: Rolling walker (2 wheels) Nurse Communication: Mobility status  Activity Tolerance: Patient tolerated treatment well Patient left: in chair;with call bell/phone within reach;with chair alarm set;with  family/visitor present  OT Visit Diagnosis: Other abnormalities of gait and mobility (R26.89);Muscle weakness (generalized) (M62.81)                Time: 9040-8970 OT Time Calculation (min): 30 min Charges:  OT General Charges $OT Visit: 1 Visit OT Evaluation $OT Eval Moderate Complexity: 1 Mod OT Treatments $Self Care/Home Management : 8-22 mins  Belinda Garcia, M.S. OTR/L  06/07/24, 1:26 PM  ascom [phone]

## 2024-06-08 DIAGNOSIS — J441 Chronic obstructive pulmonary disease with (acute) exacerbation: Secondary | ICD-10-CM | POA: Diagnosis not present

## 2024-06-08 LAB — GLUCOSE, CAPILLARY
Glucose-Capillary: 137 mg/dL — ABNORMAL HIGH (ref 70–99)
Glucose-Capillary: 300 mg/dL — ABNORMAL HIGH (ref 70–99)
Glucose-Capillary: 312 mg/dL — ABNORMAL HIGH (ref 70–99)
Glucose-Capillary: 319 mg/dL — ABNORMAL HIGH (ref 70–99)

## 2024-06-08 LAB — CULTURE, BLOOD (ROUTINE X 2)
Culture: NO GROWTH
Culture: NO GROWTH
Special Requests: ADEQUATE
Special Requests: ADEQUATE

## 2024-06-08 LAB — BASIC METABOLIC PANEL WITH GFR
Anion gap: 14 (ref 5–15)
BUN: 37 mg/dL — ABNORMAL HIGH (ref 8–23)
CO2: 25 mmol/L (ref 22–32)
Calcium: 9 mg/dL (ref 8.9–10.3)
Chloride: 98 mmol/L (ref 98–111)
Creatinine, Ser: 0.91 mg/dL (ref 0.44–1.00)
GFR, Estimated: >60 mL/min (ref >60)
Glucose, Bld: 71 mg/dL (ref 70–99)
Potassium: 4.5 mmol/L (ref 3.5–5.1)
Sodium: 137 mmol/L (ref 135–145)

## 2024-06-08 MED ORDER — INSULIN ASPART 100 UNIT/ML IJ SOLN
12.0000 [IU] | Freq: Three times a day (TID) | INTRAMUSCULAR | Status: DC
  Administered 2024-06-08 – 2024-06-10 (×6): 12 [IU] via SUBCUTANEOUS
  Filled 2024-06-08 (×6): qty 1

## 2024-06-08 NOTE — TOC Progression Note (Signed)
 Transition of Care [facility]) - Progression Note    Patient Details  Name: Belinda Garcia MRN: [id number] Date of Birth: 11-27-1950  Transition of Care [facility]) CM/SW Contact  Alvaro Louder, [place] Phone Number: 06/08/2024, 12:10 PM  Clinical Narrative:   [facility] Faxed out information to SNF's in [place]. LCSWA will present Facilities to patient at the bedside.  TOC to follow for discharge                    Expected Discharge Plan and Services                                               Social Drivers of Health (SDOH) Interventions SDOH Screenings   Food Insecurity: Patient Declined (06/03/2024)  Housing: Patient Declined (06/03/2024)  Transportation Needs: Patient Declined (06/03/2024)  Utilities: Not At Risk (01/06/2024)   Received from [facility]  Financial Resource Strain: High Risk (01/06/2024)   Received from [facility]  Physical Activity: Inactive (01/06/2024)   Received from [facility]  Social Connections: Unknown (06/03/2024)  Stress: Stress Concern Present (01/06/2024)   Received from [facility]  Tobacco Use: High Risk (06/02/2024)  Health Literacy: Adequate Health Literacy (01/06/2024)   Received from [facility]    Readmission Risk Interventions     No data to display

## 2024-06-08 NOTE — Progress Notes (Signed)
 Mobility Specialist Progress Note:    06/08/24 0940  Mobility  Activity Ambulated with assistance;Pivoted/transferred from bed to chair  Level of Assistance Minimal assist, patient does 75% or more  Assistive Device Front wheel walker  Distance Ambulated (ft) 35 ft  Range of Motion/Exercises Active;All extremities  Activity Response Tolerated well  Mobility visit 1 Mobility  Mobility Specialist Start Time (ACUTE ONLY) [id number]  Mobility Specialist Stop Time (ACUTE ONLY) 0926  Mobility Specialist Time Calculation (min) (ACUTE ONLY) 17 min   Pt received in bed, son in room. Agreeable to mobility, required MinA to stand and ambulate with RW. Tolerated well, SpO2 91% on 2L throughout session. Left pt in chair, RN in room. All needs met.   Sherrilee Ditty Mobility Specialist Please contact via Special educational needs teacher or  Rehab office at [phone]
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
## 2024-06-08 NOTE — Progress Notes (Signed)
 Progress Note   Patient: Belinda Garcia FMW:[id number] DOB: 21-Dec-1950 DOA: 06/02/2024     5 DOS: the patient was seen and examined on 06/08/2024   Brief hospital course:  Belinda Garcia is a 73 y.o. female with medical history significant of COPD on as needed oxygen at home, HTN, HLD, IDDM, hypothyroidism, chronic pain on narcotics, obesity, presented with worsening of cough wheezing shortness of breath.  Admitted for acute on chronic hypoxic respiratory failure, multifocal pneumonia, acute COPD exacerbation.  Hospital course as below   Assessment and Plan:  Principal Problem:   COPD exacerbation (HCC) Active Problems:   COPD with acute exacerbation (HCC)   Obesity (BMI 30-39.9)   Asthma without status asthmaticus   Chronic low back pain   Fatty liver   Diabetes mellitus (HCC)   Hypertension   Pulmonary embolism (HCC)   Tobacco use disorder   COPD with acute exacerbation Acute on chronic hypoxic respiratory failure Cough, dyspnea. Per patient she has been treated with several rounds of antibiotics and steroids since February, 2025.  Was noted to have pulse oximetry of 86% on room air  and initially required 15 L oxygen via nonrebreather mask.  She has been weaned back down to her baseline 2 -3 L and maintaining pulse oximetry greater than 92% CT angiogram of the chest showed focal ground-glass opacities in the left upper lobe, favored to represent infection and/or inflammation. Patchy bibasilar opacities, possibly representing atelectasis/scarring although infection is difficult to exclude. Continue Levaquin  750 mg daily to complete a 5 - 7 day course of therapy On prednisone  taper Continue scheduled and as needed bronchodilator therapy Appreciate Pulmonary input Patient is on home oxygen as needed but will likely require continuous home O2 upon discharge since her room air pulse oximetry was 86%.  CAP -Multifocal pneumonia - Respiratory cultures with mixed species -On  levofloxacin  and prednisone    Lactic acidosis May be 2/2 above 2 acute COPD exacerbation. Home metformin  may have also contributed Resolved   T2DM With hyperglycemia, steroids contributing Lantus  20units, Inc Novolog  12 TID, SSI Metformin  on hold   HTN Bp appropriate Continue losartan , hydrochlorothiazide  and metoprolol      HFpEF Doesn't appear to be acutely exacerbated 2D echocardiogram shows a normal LVEF  of 60 to 65% with no regional wall motion abnormalities - Continue imdur , metoprolol  and hydrochlorothiazide   GAD - Continue valium    Chronic pain - home duloxetine , pregabalin , tramadol    Hyothyroid Recent tsh wnl - Continue levothyroxine    Obesity BMI 33 Complicates overall prognosis and care Lifestyle modification and exercise discussed with patient     Debility - Appreciate PT input -Recommend skilled nursing facility upon discharge TOC consulted      Subjective: Feels better but continues to have a cough productive of yellow phlegm, overall improving, pending placement to SNF  Physical Exam: Vitals:   06/07/24 1948 06/07/24 2019 06/08/24 0423 06/08/24 0754  BP: 125/64  122/68 121/62  Pulse: 70  72 67  Resp: 20  18 17   Temp: 98.1 F (36.7 C)  98.1 F (36.7 C) 97.6 F (36.4 C)  TempSrc: Oral  Oral Oral  SpO2: 95% 93% 96% 96%  Weight:      Height:       General exam: Appears comfortable and in no distress Respiratory: faint expiratory wheeze, no crackles  Cardiovascular system: S1 & S2 heard, RRR. Soft systolic murmur Gastrointestinal system: Abdomen is obese, soft and nontender.   Central nervous system: Alert and oriented. No focal neurological  deficits. Extremities: Symmetric 5 x 5 power. Trace LE edema Skin: No rashes, lesions or ulcers Psychiatry: Judgement and insight appear normal. Mood & affect appropriate.     Labs reviewed  Family Communication: Plan of care discussed with the patient at the bedside.  She verbalizes  understanding and agrees with the plan.  Disposition: Status is: Inpatient Remains inpatient appropriate because: Awaiting placement  Planned Discharge Destination: Skilled nursing facility    Time spent: 35 minutes  Author: Laree Lock, MD 06/08/2024 8:24 AM  For on call review [email or web address].

## 2024-06-08 NOTE — [place] FL2 (Signed)
 [place]  MEDICAID FL2 LEVEL OF CARE FORM     IDENTIFICATION  Patient Name: Belinda Garcia Birthdate: 1951/08/16 Sex: female Admission Date (Current Location): 06/02/2024  [place] and [place] Number:  Chiropodist and Address:  [facility], [address], [place], [place] [address]      Provider Number: [id number]  Attending Physician Name and Address:  Jerelene Critchley, MD  Relative Name and Phone Number:       Current Level of Care: Hospital Recommended Level of Care: Skilled Nursing Facility Prior Approval Number:    Date Approved/Denied:   PASRR Number: [id number]  Discharge Plan: Home    Current Diagnoses: Patient Active Problem List   Diagnosis Date Noted   Obesity (BMI 30-39.9) 06/03/2024   COPD with acute exacerbation (HCC) 06/02/2024   COPD exacerbation (HCC) 06/02/2024   Chronic low back pain 03/14/2021   Rib fracture 02/08/2021   Traumatic subdural hematoma (HCC) 02/08/2021   Tobacco use disorder 06/22/2020   Fatty liver 08/19/2018   Upper abdominal pain 06/17/2018   Nausea without vomiting 06/17/2018   Asthma without status asthmaticus 11/28/2015   Diabetes mellitus (HCC) 11/28/2015   Heart murmur 11/28/2015   Hypertension 11/28/2015   Pulmonary embolism (HCC) 11/28/2015    Orientation RESPIRATION BLADDER Height & Weight     Self, Time, Situation, Place  O2 (Nasal Cannula) Incontinent Weight: 199 lb 14.4 oz (90.7 kg) Height:  5' 5 (165.1 cm)  BEHAVIORAL SYMPTOMS/MOOD NEUROLOGICAL BOWEL NUTRITION STATUS      Continent Diet (Heart Healthy)  AMBULATORY STATUS COMMUNICATION OF NEEDS Skin   Extensive Assist Verbally Normal                       Personal Care Assistance Level of Assistance  Bathing, Feeding, Dressing Bathing Assistance: Limited assistance Feeding assistance: Independent Dressing Assistance: Limited assistance     Functional Limitations Info  Sight, Hearing, Speech Sight Info:  Adequate Hearing Info: Adequate Speech Info: Adequate    SPECIAL CARE FACTORS FREQUENCY  PT (By licensed PT), OT (By licensed OT)     PT Frequency: 5x/week OT Frequency: 5x/week            Contractures      Additional Factors Info  Code Status, Allergies Code Status Info: Full Allergies Info: NKA           Current Medications (06/08/2024):  This is the current hospital active medication list Current Facility-Administered Medications  Medication Dose Route Frequency Provider Last Rate Last Admin   acetaminophen  (TYLENOL ) tablet 650 mg  650 mg Oral Q6H PRN Laurita Manor T, MD   650 mg at 06/07/24 1455   Or   acetaminophen  (TYLENOL ) suppository 650 mg  650 mg Rectal Q6H PRN Laurita Manor T, MD       albuterol  (PROVENTIL ) (2.5 MG/3ML) 0.083% nebulizer solution 2.5 mg  2.5 mg Nebulization Q2H PRN Laurita Manor T, MD   2.5 mg at 06/06/24 9247   aspirin  EC tablet 81 mg  81 mg Oral Daily Laurita Manor T, MD   81 mg at 06/08/24 9162   atorvastatin  (LIPITOR) tablet 40 mg  40 mg Oral QHS Laurita Manor T, MD   40 mg at 06/07/24 2150   benzonatate  (TESSALON ) capsule 100 mg  100 mg Oral TID PRN Laurita Manor T, MD   100 mg at 06/08/24 9078   budesonide  (PULMICORT ) nebulizer solution 0.25 mg  0.25 mg Nebulization BID Agbata, Tochukwu, MD   0.25  mg at 06/08/24 0736   cyclobenzaprine  (FLEXERIL ) tablet 10 mg  10 mg Oral TID PRN Laurita Cort DASEN, MD   10 mg at 06/07/24 1455   diazepam  (VALIUM ) tablet 10 mg  10 mg Oral QHS PRN Laurita Cort T, MD   10 mg at 06/05/24 2234   docusate sodium  (COLACE) capsule 100 mg  100 mg Oral BID Laurita Cort T, MD   100 mg at 06/08/24 9162   DULoxetine  (CYMBALTA ) DR capsule 60 mg  60 mg Oral Daily Zhang, Ping T, MD   60 mg at 06/08/24 9162   enoxaparin  (LOVENOX ) injection 45 mg  0.5 mg/kg Subcutaneous Q24H Laurita Cort T, MD   45 mg at 06/07/24 2153   guaiFENesin  (MUCINEX ) 12 hr tablet 1,200 mg  1,200 mg Oral BID Laurita Cort T, MD   1,200 mg at 06/08/24 9162   hydrochlorothiazide   (HYDRODIURIL ) tablet 25 mg  25 mg Oral Daily Agbata, Tochukwu, MD   25 mg at 06/08/24 0837   insulin  aspart (novoLOG ) injection 0-20 Units  0-20 Units Subcutaneous TID [facility] Mansy, Madison LABOR, MD   3 Units at 06/08/24 9167   insulin  aspart (novoLOG ) injection 8 Units  8 Units Subcutaneous TID WC Agbata, Tochukwu, MD   8 Units at 06/08/24 9167   insulin  glargine (LANTUS ) injection 20 Units  20 Units Subcutaneous Daily Agbata, Tochukwu, MD   20 Units at 06/08/24 0833   ipratropium-albuterol  (DUONEB) 0.5-2.5 (3) MG/3ML nebulizer solution 3 mL  3 mL Inhalation TID Agbata, Tochukwu, MD   3 mL at 06/08/24 0736   isosorbide  mononitrate (IMDUR ) 24 hr tablet 60 mg  60 mg Oral Daily Zhang, Ping T, MD   60 mg at 06/08/24 0836   levofloxacin  (LEVAQUIN ) tablet 750 mg  750 mg Oral Daily Patel, Rutvi M, RPH   750 mg at 06/08/24 9162   levothyroxine  (SYNTHROID ) tablet 125 mcg  125 mcg Oral Q0600 Laurita Cort T, MD   125 mcg at 06/08/24 0630   loperamide  (IMODIUM ) capsule 2 mg  2 mg Oral Daily PRN Laurita Cort DASEN, MD       losartan  (COZAAR ) tablet 100 mg  100 mg Oral Daily Chappell, Alex B, RPH   100 mg at 06/08/24 9163   meclizine  (ANTIVERT ) tablet 25 mg  25 mg Oral TID PRN Laurita Cort DASEN, MD   25 mg at 06/06/24 1228   metoprolol  succinate (TOPROL -XL) 24 hr tablet 25 mg  25 mg Oral Daily Zhang, Ping T, MD   25 mg at 06/08/24 9162   montelukast  (SINGULAIR ) tablet 10 mg  10 mg Oral QHS Laurita Cort T, MD   10 mg at 06/07/24 2150   ondansetron  (ZOFRAN ) tablet 4 mg  4 mg Oral Q6H PRN Laurita Cort DASEN, MD       Or   ondansetron  (ZOFRAN ) injection 4 mg  4 mg Intravenous Q6H PRN Laurita Cort DASEN, MD       pantoprazole  (PROTONIX ) EC tablet 40 mg  40 mg Oral Daily Zhang, Ping T, MD   40 mg at 06/08/24 0837   [START ON 06/09/2024] predniSONE  (DELTASONE ) tablet 25 mg  25 mg Oral Q breakfast Clair Marolyn NOVAK, RPH       Followed by   NOREEN ON 06/10/2024] predniSONE  (DELTASONE ) tablet 20 mg  20 mg Oral Q breakfast Clair Marolyn NOVAK, RPH        Followed by   NOREEN ON 06/11/2024] predniSONE  (DELTASONE ) tablet 15 mg  15 mg Oral  Q breakfast Clair Marolyn NOVAK, [place]       Followed by   NOREEN ON 06/12/2024] predniSONE  (DELTASONE ) tablet 10 mg  10 mg Oral Q breakfast Clair Marolyn NOVAK, [facility]       pregabalin  (LYRICA ) capsule 75 mg  75 mg Oral BID Laurita Manor T, MD   75 mg at 06/08/24 9162   senna-docusate (Senokot-S) tablet 1 tablet  1 tablet Oral QHS PRN Laurita Manor DASEN, MD       traMADol  (ULTRAM ) tablet 50 mg  50 mg Oral Q6H PRN Laurita Manor DASEN, MD   50 mg at 06/08/24 9078     Discharge Medications: Please see discharge summary for a list of discharge medications.  Relevant Imaging Results:  Relevant Lab Results:   Additional Information SSN: [id number]  Alvaro Louder, LCSW

## 2024-06-08 NOTE — Inpatient Diabetes Management (Signed)
 Inpatient Diabetes Program Recommendations  AACE/ADA: New Consensus Statement on Inpatient Glycemic Control (2015)  Target Ranges:  Prepandial:   less than 140 mg/dL      Peak postprandial:   less than 180 mg/dL (1-2 hours)      Critically ill patients:  140 - 180 mg/dL    Latest Reference Range & Units 06/07/24 11:53 06/07/24 16:44 06/07/24 18:22 06/07/24 20:57 06/08/24 07:53 06/08/24 11:41  Glucose-Capillary 70 - 99 mg/dL 849 (H)  Novolog  11 units @ 1310 447 (H)  Novolog  28 units @ 1716 539 (HH)  Novolog  10 units @  1846 287 (H)  Novolog  11 units given @ 2219 137 (H)  Novolog  11 units @ 0832  Gargine 20 units @ 0832   Prednisone  30mg   300 (H)    Review of Glycemic Control  Diabetes history:  Outpatient Diabetes medications: Metaglip  5/500 mg 1 tablet BID,Lantus  6 units at HS for CBG >200   Current orders for Inpatient glycemic control: Lantus  20 units daily, Novolog  8 units TID with  meals, Novolog  0-20 units AC&HS; Prednisone  30 mg QAM (tapering)    Inpatient Diabetes Program Recommendations:   CBG was 447 mg/dl at 8283 last night and a total of Novolog  28 units was administered. CBG was then rechecked at 1822 and was 539 mg/dl - pt was administered Novolog  10 units. At 2057 CBG was 287 mg/dl and pt received Novolog  11 units. Pt's CBG did trend down due to receiving multiple correction doses within 3-4 hours; however, now back up to 300 mg/dl.   Insulin : Please consider increasing Lantus  to 27 units daily and meal coverage to Novolog  12 units TID with meals.   Thanks,  Lavanda Search, RN, MSN, [facility]  Inpatient Diabetes Coordinator  Pager [phone] (8a-5p)

## 2024-06-09 DIAGNOSIS — J441 Chronic obstructive pulmonary disease with (acute) exacerbation: Secondary | ICD-10-CM | POA: Diagnosis not present

## 2024-06-09 LAB — GLUCOSE, CAPILLARY
Glucose-Capillary: 167 mg/dL — ABNORMAL HIGH (ref 70–99)
Glucose-Capillary: 240 mg/dL — ABNORMAL HIGH (ref 70–99)
Glucose-Capillary: 345 mg/dL — ABNORMAL HIGH (ref 70–99)
Glucose-Capillary: 392 mg/dL — ABNORMAL HIGH (ref 70–99)

## 2024-06-09 MED ORDER — DIPHENHYDRAMINE HCL 25 MG PO CAPS: 25.0000 mg | ORAL_CAPSULE | Freq: Every evening | ORAL | Status: DC | PRN

## 2024-06-09 MED ORDER — MELATONIN 5 MG PO TABS
2.5000 mg | ORAL_TABLET | Freq: Every day | ORAL | Status: DC
  Administered 2024-06-09: 2.5 mg via ORAL
  Filled 2024-06-09: qty 1

## 2024-06-09 NOTE — Progress Notes (Signed)
 PT Cancellation Note  Patient Details Name: Belinda Garcia MRN: [id number] DOB: 08-23-1951   Cancelled Treatment:     PT attempt. Pt endorses just returning to bed after having OT earlier in the day and then sitting in recliner for several hours. Pt politely requested dino return later. Acute PT will continue to follow and progress per current POC.    Rankin KATHEE Essex 06/09/2024, 11:26 AM

## 2024-06-09 NOTE — Progress Notes (Signed)
 Progress Note   Patient: Belinda Garcia FMW:[id number] DOB: 04/12/1951 DOA: 06/02/2024     6 DOS: the patient was seen and examined on 06/09/2024   Brief hospital course:  AKI Belinda Garcia is a 73 y.o. female with medical history significant of COPD on as needed oxygen at home, HTN, HLD, IDDM, hypothyroidism, chronic pain on narcotics, obesity, presented with worsening of cough wheezing shortness of breath.  Admitted for acute on chronic hypoxic respiratory failure, multifocal pneumonia, acute COPD exacerbation.  Hospital course as below   Assessment and Plan:  Principal Problem:   COPD exacerbation (HCC) Active Problems:   COPD with acute exacerbation (HCC)   Obesity (BMI 30-39.9)   Asthma without status asthmaticus   Chronic low back pain   Fatty liver   Diabetes mellitus (HCC)   Hypertension   Pulmonary embolism (HCC)   Tobacco use disorder   COPD with acute exacerbation Acute on chronic hypoxic respiratory failure Cough, dyspnea. Per patient she has been treated with several rounds of antibiotics and steroids since February, 2025.  Was noted to have pulse oximetry of 86% on room air  and initially required 15 L oxygen via nonrebreather mask.  She has been weaned back down to her baseline 2 -3 L and maintaining pulse oximetry greater than 92% CT angiogram of the chest showed focal ground-glass opacities in the left upper lobe, favored to represent infection and/or inflammation. Patchy bibasilar opacities, possibly representing atelectasis/scarring although infection is difficult to exclude. Continue Levaquin  750 mg daily to complete a 5 - 7 day course of therapy On prednisone  taper Continue scheduled and as needed bronchodilator therapy Appreciate Pulmonary input, follow-up outpatient within 1 week Patient is on home oxygen as needed but will likely require continuous home O2 upon discharge since her room air pulse oximetry was 86%.  CAP -Multifocal pneumonia - Respiratory  cultures with mixed species -On levofloxacin  and prednisone    Lactic acidosis May be 2/2 above 2 acute COPD exacerbation. Home metformin  may have also contributed Resolved   T2DM With hyperglycemia, steroids contributing Lantus  20 units, Inc Novolog  12 TID, SSI Metformin  on hold   HTN Bp appropriate Continue losartan , hydrochlorothiazide  and metoprolol      HFpEF Doesn't appear to be acutely exacerbated 2D echocardiogram shows a normal LVEF  of 60 to 65% with no regional wall motion abnormalities - Continue imdur , metoprolol  and hydrochlorothiazide   GAD - Continue valium    Chronic pain - home duloxetine , pregabalin , tramadol    Hyothyroid Recent tsh wnl - Continue levothyroxine    Obesity BMI 33 Complicates overall prognosis and care Lifestyle modification and exercise discussed with patient     Debility - Appreciate PT input -Recommend skilled nursing facility upon discharge TOC consulted      Subjective: Feels better but continues to have a cough productive of yellow phlegm, overall improving, pending placement to SNF Also reports was not able to sleep last night, added melatonin and Benadryl  as needed which patient takes at home.  Plan for discharge tomorrow to SNF as per [facility]  Physical Exam: Vitals:   06/08/24 2040 06/09/24 0243 06/09/24 0740 06/09/24 0757  BP:  116/60  (!) 122/51  Pulse:  68  70  Resp:  18  14  Temp:  98.8 F (37.1 C)  97.7 F (36.5 C)  TempSrc:      SpO2: 93% 95% 95% 93%  Weight:      Height:       General exam: Appears comfortable and in no distress Respiratory: faint  expiratory wheeze, no crackles  Cardiovascular system: S1 & S2 heard, RRR. Soft systolic murmur Gastrointestinal system: Abdomen is obese, soft and nontender.   Central nervous system: Alert and oriented. No focal neurological deficits. Extremities: Symmetric 5 x 5 power. Trace LE edema Skin: No rashes, lesions or ulcers Psychiatry: Judgement and insight appear  normal. Mood & affect appropriate.     Labs reviewed  Family Communication: Plan of care discussed with the patient at the bedside.  She verbalizes understanding and agrees with the plan.  Disposition: Status is: Inpatient Remains inpatient appropriate because: Awaiting placement  Planned Discharge Destination: Skilled nursing facility    Time spent: 35 minutes  Author: Laree Lock, MD 06/09/2024 2:48 PM  For on call review [email or web address].

## 2024-06-09 NOTE — TOC Progression Note (Signed)
 Transition of Care [facility]) - Progression Note    Patient Details  Name: Belinda Garcia MRN: [id number] Date of Birth: [birth year]  Transition of Care [facility]) CM/SW Contact  Alvaro Louder, [place] Phone Number: 06/09/2024, 4:32 PM  Clinical Narrative:   Patient selected [facility], Has bed availability tomorrow.   SNF Auth approved 06/09/24-06/13/24 plan auth id # [id number]                       Expected Discharge Plan and Services                                               Social Drivers of Health (SDOH) Interventions SDOH Screenings   Food Insecurity: Patient Declined (06/03/2024)  Housing: Patient Declined (06/03/2024)  Transportation Needs: Patient Declined (06/03/2024)  Utilities: Not At Risk (01/06/2024)   Received from [facility]  Financial Resource Strain: High Risk (01/06/2024)   Received from [facility]  Physical Activity: Inactive (01/06/2024)   Received from [facility]  Social Connections: Unknown (06/03/2024)  Stress: Stress Concern Present (01/06/2024)   Received from [facility]  Tobacco Use: High Risk (06/02/2024)  Health Literacy: Adequate Health Literacy (01/06/2024)   Received from [facility]    Readmission Risk Interventions     No data to display

## 2024-06-09 NOTE — Progress Notes (Signed)
 Occupational Therapy Treatment Patient Details Name: Belinda Garcia MRN: [id number] DOB: Feb 15, 1951 Today's Date: 06/09/2024   History of present illness 73 y.o. female with medical history significant of COPD on as needed oxygen at home, HTN, HLD, IDDM, hypothyroidism, chronic pain on narcotics, obesity, presented with worsening of cough wheezing shortness of breath.   OT comments  Belinda Garcia was seen for OT treatment on this date. Upon arrival to room pt in bed, agreeable to tx. Pt requires CGA + RW for toilet t/f. MOD A don B socks in sitting. Improved static sitting balance and tolerance, continues to report intermittent dizziness. SpO2 92% on 2L [place] t/o session. Educated on d/c recs and falls safety. Pt making good progress toward goals, will continue to follow POC. Discharge recommendation remains appropriate.        If plan is discharge home, recommend the following:  A little help with walking and/or transfers;A little help with bathing/dressing/bathroom;Help with stairs or ramp for entrance   Equipment Recommendations  BSC/3in1    Recommendations for Other Services      Precautions / Restrictions Precautions Precautions: Fall Recall of Precautions/Restrictions: Intact Restrictions Weight Bearing Restrictions Per Provider Order: No       Mobility Bed Mobility Overal bed mobility: Needs Assistance Bed Mobility: Supine to Sit     Supine to sit: Supervision, HOB elevated          Transfers Overall transfer level: Needs assistance Equipment used: Rolling walker (2 wheels) Transfers: Sit to/from Stand Sit to Stand: Contact guard assist                 Balance Overall balance assessment: Needs assistance Sitting-balance support: No upper extremity supported, Feet supported Sitting balance-Leahy Scale: Fair     Standing balance support: Bilateral upper extremity supported Standing balance-Leahy Scale: Fair                             ADL either  performed or assessed with clinical judgement   ADL Overall ADL's : Needs assistance/impaired                                       General ADL Comments: CGA + RW for toilet t/f. MOD A don B socks in sitting    Extremity/Trunk Assessment              Vision       Perception     Praxis     Communication Communication Communication: No apparent difficulties   Cognition Arousal: Alert Behavior During Therapy: Anxious Cognition: No apparent impairments                               Following commands: Intact        Cueing      Exercises      Shoulder Instructions       General Comments SpO2 92% on 2L [place]    Pertinent Vitals/ Pain       Pain Assessment Pain Assessment: No/denies pain   Frequency  Min 3X/week        Progress Toward Goals  OT Goals(current goals can now be found in the care plan section)  Progress towards OT goals: Progressing toward goals  Acute Rehab OT Goals OT Goal Formulation: With patient/family Time For  Goal Achievement: 06/21/24 Potential to Achieve Goals: Good ADL Goals Pt Will Perform Grooming: with modified independence;standing Pt Will Perform Lower Body Dressing: with modified independence;sit to/from stand Pt Will Transfer to Toilet: with modified independence;ambulating;regular height toilet  Plan      Co-evaluation                 AM-PAC OT 6 Clicks Daily Activity     Outcome Measure   Help from another person eating meals?: None Help from another person taking care of personal grooming?: A Little Help from another person toileting, which includes using toliet, bedpan, or urinal?: A Little Help from another person bathing (including washing, rinsing, drying)?: A Lot Help from another person to put on and taking off regular upper body clothing?: A Little Help from another person to put on and taking off regular lower body clothing?: A Lot 6 Click Score: 17    End of  Session Equipment Utilized During Treatment: Rolling walker (2 wheels);Gait belt;Oxygen  OT Visit Diagnosis: Other abnormalities of gait and mobility (R26.89);Muscle weakness (generalized) (M62.81)   Activity Tolerance Patient tolerated treatment well   Patient Left in chair;with call bell/phone within reach;with chair alarm set   Nurse Communication          Time: [phone] OT Time Calculation (min): 24 min  Charges: OT General Charges $OT Visit: 1 Visit OT Treatments $Self Care/Home Management : 23-37 mins  Elston Slot, M.S. OTR/L  06/09/24, 9:34 AM  ascom [phone]

## 2024-06-09 NOTE — Progress Notes (Signed)
   06/09/24 1400  Mobility  Activity Ambulated with assistance;Stood at bedside;Respositioned in chair  Level of Assistance Contact guard assist, steadying assist  Assistive Device Front wheel walker  Distance Ambulated (ft) 150 ft  Range of Motion/Exercises Active Assistive  Activity Response Tolerated well  Mobility Referral Yes  Mobility visit 1 Mobility  Mobility Specialist Start Time (ACUTE ONLY) 1403  Mobility Specialist Stop Time (ACUTE ONLY) 1426  Mobility Specialist Time Calculation (min) (ACUTE ONLY) 23 min   Mobility Specialist - Progress Note  Pt was in bed upon arrival O2 was at 2 LPM. O2 levels sustained above 88% throughout ambulation . Pt repositioned in chair with needs in reach.   Clem Rodes Mobility Specialist 06/09/24, 2:37 PM

## 2024-06-10 ENCOUNTER — Telehealth (HOSPITAL_COMMUNITY): Payer: Self-pay | Admitting: Pharmacy Technician

## 2024-06-10 ENCOUNTER — Other Ambulatory Visit (HOSPITAL_COMMUNITY): Payer: Self-pay

## 2024-06-10 DIAGNOSIS — J441 Chronic obstructive pulmonary disease with (acute) exacerbation: Secondary | ICD-10-CM | POA: Diagnosis not present

## 2024-06-10 LAB — GLUCOSE, CAPILLARY
Glucose-Capillary: 165 mg/dL — ABNORMAL HIGH (ref 70–99)
Glucose-Capillary: 214 mg/dL — ABNORMAL HIGH (ref 70–99)

## 2024-06-10 MED ORDER — PREDNISONE 10 MG PO TABS: ORAL_TABLET | ORAL | 0 refills | Status: AC

## 2024-06-10 MED ORDER — DIAZEPAM 10 MG PO TABS: 10.0000 mg | ORAL_TABLET | Freq: Every evening | ORAL | 0 refills | Status: AC | PRN

## 2024-06-10 MED ORDER — GUAIFENESIN-DM 100-10 MG/5ML PO SYRP: 5.0000 mL | ORAL_SOLUTION | ORAL | 0 refills | Status: AC | PRN

## 2024-06-10 MED ORDER — PREGABALIN 75 MG PO CAPS
75.0000 mg | ORAL_CAPSULE | Freq: Two times a day (BID) | ORAL | 0 refills | Status: AC
Start: 2024-06-10 — End: ?

## 2024-06-10 MED ORDER — BUDESON-GLYCOPYRROL-FORMOTEROL 160-9-4.8 MCG/ACT IN AERO
2.0000 | INHALATION_SPRAY | Freq: Two times a day (BID) | RESPIRATORY_TRACT | Status: DC
  Administered 2024-06-10: 2 via RESPIRATORY_TRACT
  Filled 2024-06-10: qty 5.9

## 2024-06-10 MED ORDER — IPRATROPIUM-ALBUTEROL 0.5-2.5 (3) MG/3ML IN SOLN: 3.0000 mL | Freq: Two times a day (BID) | RESPIRATORY_TRACT | Status: DC

## 2024-06-10 MED ORDER — BUDESON-GLYCOPYRROL-FORMOTEROL 160-9-4.8 MCG/ACT IN AERO: 2.0000 | INHALATION_SPRAY | Freq: Two times a day (BID) | RESPIRATORY_TRACT | Status: AC

## 2024-06-10 MED ORDER — TRAMADOL HCL 50 MG PO TABS: 50.0000 mg | ORAL_TABLET | Freq: Four times a day (QID) | ORAL | 0 refills | Status: AC | PRN

## 2024-06-10 NOTE — Plan of Care (Signed)
   Problem: Education: Goal: Ability to describe self-care measures that may prevent or decrease complications (Diabetes Survival Skills Education) will improve Outcome: Adequate for Discharge Goal: Individualized Educational Video(s) Outcome: Adequate for Discharge   Problem: Coping: Goal: Ability to adjust to condition or change in health will improve Outcome: Adequate for Discharge   Problem: Fluid Volume: Goal: Ability to maintain a balanced intake and output will improve Outcome: Adequate for Discharge   Problem: Health Behavior/Discharge Planning: Goal: Ability to identify and utilize available resources and services will improve Outcome: Adequate for Discharge Goal: Ability to manage health-related needs will improve Outcome: Adequate for Discharge   Problem: Metabolic: Goal: Ability to maintain appropriate glucose levels will improve Outcome: Adequate for Discharge   Problem: Nutritional: Goal: Maintenance of adequate nutrition will improve Outcome: Adequate for Discharge Goal: Progress toward achieving an optimal weight will improve Outcome: Adequate for Discharge   Problem: Skin Integrity: Goal: Risk for impaired skin integrity will decrease Outcome: Adequate for Discharge   Problem: Tissue Perfusion: Goal: Adequacy of tissue perfusion will improve Outcome: Adequate for Discharge   Problem: Education: Goal: Knowledge of General Education information will improve Description: Including pain rating scale, medication(s)/side effects and non-pharmacologic comfort measures Outcome: Adequate for Discharge   Problem: Health Behavior/Discharge Planning: Goal: Ability to manage health-related needs will improve Outcome: Adequate for Discharge   Problem: Clinical Measurements: Goal: Ability to maintain clinical measurements within normal limits will improve Outcome: Adequate for Discharge Goal: Will remain free from infection Outcome: Adequate for Discharge Goal:  Diagnostic test results will improve Outcome: Adequate for Discharge Goal: Respiratory complications will improve Outcome: Adequate for Discharge Goal: Cardiovascular complication will be avoided Outcome: Adequate for Discharge   Problem: Activity: Goal: Risk for activity intolerance will decrease Outcome: Adequate for Discharge   Problem: Nutrition: Goal: Adequate nutrition will be maintained Outcome: Adequate for Discharge   Problem: Coping: Goal: Level of anxiety will decrease Outcome: Adequate for Discharge   Problem: Elimination: Goal: Will not experience complications related to bowel motility Outcome: Adequate for Discharge Goal: Will not experience complications related to urinary retention Outcome: Adequate for Discharge   Problem: Pain Managment: Goal: General experience of comfort will improve and/or be controlled Outcome: Adequate for Discharge   Problem: Safety: Goal: Ability to remain free from injury will improve Outcome: Adequate for Discharge   Problem: Skin Integrity: Goal: Risk for impaired skin integrity will decrease Outcome: Adequate for Discharge   Problem: Education: Goal: Knowledge of disease or condition will improve Outcome: Adequate for Discharge Goal: Knowledge of the prescribed therapeutic regimen will improve Outcome: Adequate for Discharge Goal: Individualized Educational Video(s) Outcome: Adequate for Discharge   Problem: Activity: Goal: Ability to tolerate increased activity will improve Outcome: Adequate for Discharge Goal: Will verbalize the importance of balancing activity with adequate rest periods Outcome: Adequate for Discharge   Problem: Respiratory: Goal: Ability to maintain a clear airway will improve Outcome: Adequate for Discharge Goal: Levels of oxygenation will improve Outcome: Adequate for Discharge Goal: Ability to maintain adequate ventilation will improve Outcome: Adequate for Discharge

## 2024-06-10 NOTE — Progress Notes (Signed)
 Physical Therapy Treatment Patient Details Name: Belinda Garcia MRN: [id number] DOB: 1951/04/14 Today's Date: 06/10/2024   History of Present Illness 73 y.o. female with medical history significant of COPD on as needed oxygen at home, HTN, HLD, IDDM, hypothyroidism, chronic pain on narcotics, obesity, presented with worsening of cough wheezing shortness of breath.    PT Comments  Pt was on 2 L o2 upon arrival. She is A and O x 4. Removed o2 throughout session with sao2 > 88%. RN made aware post session and closely monitor throughout shift. Pt was able to tolerate getting OOB. Standing to RW and ambulating ~ 120 ft total however several standing rest and constant vcing for posture correction. Pt remains not at her baseline abilities and will benefit form continued skilled PT at DC to maximize independence and safety with all ADLs.    If plan is discharge home, recommend the following: A little help with walking and/or transfers;A little help with bathing/dressing/bathroom     Equipment Recommendations  Other (comment) (Defer to next level of care)       Precautions / Restrictions Precautions Precautions: Fall Recall of Precautions/Restrictions: Intact Restrictions Weight Bearing Restrictions Per Provider Order: No     Mobility  Bed Mobility Overal bed mobility: Needs Assistance Bed Mobility: Supine to Sit  Supine to sit: Supervision (HOB slightly elevated)   Transfers Overall transfer level: Needs assistance Equipment used: Rolling walker (2 wheels) Transfers: Sit to/from Stand Sit to Stand: Contact guard assist  General transfer comment: Vcs throughout for handplacement, fwd wt shift, and need to monitor sao2 at DC. Pt has home O2 already but per son, does not wear it.    Ambulation/Gait Ambulation/Gait assistance: Contact guard assist Gait Distance (Feet): 120 Feet Assistive device: Rolling walker (2 wheels) Gait Pattern/deviations: Step-through pattern Gait velocity:  decreased  General Gait Details: no LOB or safety concerns however pt does require 2 standing rest and does endorse fatigue.       Balance Overall balance assessment: Needs assistance Sitting-balance support: No upper extremity supported, Feet supported Sitting balance-Leahy Scale: Fair   Standing balance support: Bilateral upper extremity supported, During functional activity, Reliant on assistive device for balance Standing balance-Leahy Scale: Fair       Hotel manager: No apparent difficulties  Cognition Arousal: Alert      Following commands: Intact      Cueing Cueing Techniques: Verbal cues, Tactile cues    PT Goals (current goals can now be found in the care plan section) Acute Rehab PT Goals Patient Stated Goal: get better so I can go home. Progress towards PT goals: Progressing toward goals    Frequency    Min 2X/week       AM-PAC PT 6 Clicks Mobility   Outcome Measure  Help needed turning from your back to your side while in a flat bed without using bedrails?: A Little Help needed moving from lying on your back to sitting on the side of a flat bed without using bedrails?: A Little Help needed moving to and from a bed to a chair (including a wheelchair)?: A Little Help needed standing up from a chair using your arms (e.g., wheelchair or bedside chair)?: A Little Help needed to walk in hospital room?: A Lot Help needed climbing 3-5 steps with a railing? : A Lot 6 Click Score: 16    End of Session Equipment Utilized During Treatment: Gait belt;Oxygen (2L o2) Activity Tolerance: Patient tolerated treatment well;Patient limited by fatigue Patient left:  in chair;with call bell/phone within reach;with bed alarm set;with family/visitor present;with nursing/sitter in room Nurse Communication: Mobility status PT Visit Diagnosis: Muscle weakness (generalized) (M62.81);Difficulty in walking, not elsewhere classified (R26.2)      Time: 8940-8879 PT Time Calculation (min) (ACUTE ONLY): 21 min  Charges:    $Gait Training: 8-22 mins PT General Charges $$ ACUTE PT VISIT: 1 Visit                     Rankin Essex PTA 06/10/24, 1:03 PM

## 2024-06-10 NOTE — Progress Notes (Signed)
 Mobility Specialist Progress Note:    06/10/24 0941  Mobility  Activity Refused and notified nurse if applicable   Kindly refused d/t fatigue and overall not feeling well. Will attempt at a later time. All needs met.  Sherrilee Ditty Mobility Specialist Please contact via Special educational needs teacher or  Rehab office at [phone]

## 2024-06-10 NOTE — TOC Transition Note (Signed)
 Transition of Care [facility]) - Discharge Note   Patient Details  Name: Belinda Garcia MRN: [id number] Date of Birth: 04-22-1951  Transition of Care [facility]) CM/SW Contact:  Alvaro Louder, LCSW Phone Number: 06/10/2024, 3:13 PM   Clinical Narrative:   LCSWA received insurance approval for patient to admit to SNF [facility]. LCSWA confirmed with MD that patient is stable for discharge. LCSWA notified the patient and they are in agreement with discharge SNF. LCSWA confirmed bed is available at. Transport arranged with [facility] for next available.   Room 608, Number to call report [phone]    TOC to follow for discharge    Final next level of care: Skilled Nursing Facility Barriers to Discharge: No Barriers Identified   Patient Goals and CMS Choice            Discharge Placement              Patient chooses bed at: [facility] Patient to be transferred to facility by: [facility] Name of family member notified: Self Patient and family notified of of transfer: 06/10/24  Discharge Plan and Services Additional resources added to the After Visit Summary for                                       Social Drivers of Health (SDOH) Interventions SDOH Screenings   Food Insecurity: Patient Declined (06/03/2024)  Housing: Patient Declined (06/03/2024)  Transportation Needs: Patient Declined (06/03/2024)  Utilities: Not At Risk (01/06/2024)   Received from [facility]  Financial Resource Strain: High Risk (01/06/2024)   Received from [facility]  Physical Activity: Inactive (01/06/2024)   Received from [facility]  Social Connections: Unknown (06/03/2024)  Stress: Stress Concern Present (01/06/2024)   Received from [facility]  Tobacco Use: High Risk (06/02/2024)  Health Literacy: Adequate Health Literacy (01/06/2024)   Received from [facility]     Readmission Risk  Interventions     No data to display

## 2024-06-10 NOTE — Discharge Summary (Addendum)
 Physician Discharge Summary   Patient: Belinda Garcia MRN: [id number] DOB: 13-Mar-1951  Admit date:     06/02/2024  Discharge date: 06/10/24  Discharge Physician: Laree Lock   PCP: Valora Agent, MD   Recommendations at discharge:  Follow up with SNF provider - Repeat BMP, CBC within 1 week Monitor BP, CBG and titrate meds as needed  Follow up with Pulmonary within 1 week  On Home oxygen 2L, prn  Discharge Diagnoses: Principal Problem:   COPD exacerbation (HCC) Active Problems:   COPD with acute exacerbation (HCC)   Obesity (BMI 30-39.9)   Asthma without status asthmaticus   Chronic low back pain   Fatty liver   Diabetes mellitus (HCC)   Hypertension   Pulmonary embolism (HCC)   Tobacco use disorder  Resolved Problems:   * No resolved hospital problems. *  Hospital Course: Belinda Garcia is a 73 y.o. female with medical history significant of COPD on as needed oxygen at home, HTN, HLD, IDDM, hypothyroidism, chronic pain on narcotics, obesity, presented with worsening of cough wheezing shortness of breath.  Admitted for acute on chronic hypoxic respiratory failure, multifocal pneumonia, acute COPD exacerbation.  Hospital course as below    COPD with acute exacerbation Acute on chronic hypoxic respiratory failure - Per patient she has been treated with several rounds of antibiotics and steroids since February, 2025.  - Was noted to have pulse oximetry of 86% on room air  and initially required 15 L oxygen via nonrebreather mask.  She has been weaned back down to her baseline 2 -3 L and maintaining pulse oximetry greater than 92% - CT angiogram of the chest showed focal ground-glass opacities in the left upper lobe, favored to represent infection and/or inflammation. Patchy bibasilar opacities, possibly representing atelectasis/scarring although infection is difficult to exclude. - s/p Levaquin  course for 7 days - On prednisone  taper - Change Advair to Breztri , Duonebs  prn - Seen by Pulmonary, follow up outpatient in 1 week   CAP -Multifocal pneumonia - completed course of levofloxacin    Lactic acidosis - resolved May be 2/2 above 2 acute COPD exacerbation. Home metformin  may have also contributed Resolved   T2DM Steroid induced hyperglycemia -A1c 8.1 -Discharged on home regimen, tapering steroids and will finish in 2 days   HTN Continue losartan , hydrochlorothiazide  and metoprolol  Not taking amlodipine  at home   Chronic HFpEF Euvolemic 2D echocardiogram shows a normal LVEF  of 60 to 65% with no regional wall motion abnormalities - Continue imdur , metoprolol  and hydrochlorothiazide    GAD - Continue valium  - Discontinued buspirone, bupropion  -not taking at home   Chronic pain - home duloxetine , pregabalin , tramadol    Hyothyroid - Continue levothyroxine    Obesity BMI 33.2 Lifestyle modification and exercise discussed with patient  Debility Seen by PT, SNF at discharge    Pain control - [place]  Controlled Substance Reporting System database was reviewed. and patient was instructed, not to drive, operate heavy machinery, perform activities at heights, swimming or participation in water activities or provide baby-sitting services while on Pain, Sleep and Anxiety Medications; until their outpatient Physician has advised to do so again. Also recommended to not to take more than prescribed Pain, Sleep and Anxiety Medications.   Consultants: Pulmonary Procedures performed: None Disposition: Skilled nursing facility Diet recommendation:  Discharge Diet Orders (From admission, onward)     Start     Ordered   06/10/24 0000  Diet - low sodium heart healthy        06/10/24  1336            DISCHARGE MEDICATION: Allergies as of 06/10/2024   No Known Allergies      Medication List     STOP taking these medications    ADVAIR HFA IN   amLODipine  5 MG tablet Commonly known as: NORVASC    buPROPion  150 MG 12 hr  tablet Commonly known as: WELLBUTRIN  SR   busPIRone 15 MG tablet Commonly known as: BUSPAR   fluticasone -salmeterol 115-21 MCG/ACT inhaler Commonly known as: ADVAIR HFA   losartan -hydrochlorothiazide  100-25 MG tablet Commonly known as: HYZAAR       TAKE these medications    albuterol  (2.5 MG/3ML) 0.083% nebulizer solution Commonly known as: PROVENTIL  :1 Vial(s) Every 6 Hours PRN   aspirin  EC 81 MG tablet Take 81 mg by mouth daily. Swallow whole.   atorvastatin  40 MG tablet Commonly known as: LIPITOR Take 40 mg by mouth at bedtime.   budesonide -glycopyrrolate -formoterol  160-9-4.8 MCG/ACT Aero inhaler Commonly known as: BREZTRI  Inhale 2 puffs into the lungs 2 (two) times daily.   chlorhexidine 0.12 % solution Commonly known as: PERIDEX RINSE MOUTH WITH (1 CAPFUL) FOR 30 SECONDS MORNING AND BEDTIME AFTER BRUSHING. SPIT OUT, DO NOT SWALLOW   cholecalciferol  1000 units tablet Commonly known as: VITAMIN D  Take 1 tablet (1,000 Units total) by mouth daily.   cyclobenzaprine  10 MG tablet Commonly known as: FLEXERIL  Take 10 mg by mouth 3 (three) times daily as needed for muscle spasms.   diazepam  10 MG tablet Commonly known as: VALIUM  Take 1 tablet (10 mg total) by mouth at bedtime as needed for anxiety.   diclofenac sodium 1 % Gel Commonly known as: VOLTAREN Apply 2 g topically 4 (four) times daily.   docusate sodium  100 MG capsule Commonly known as: COLACE Take 100 mg by mouth 2 (two) times daily.   DULoxetine  30 MG capsule Commonly known as: CYMBALTA  Take 60 mg by mouth daily.   esomeprazole 40 MG capsule Commonly known as: NEXIUM Take 40 mg by mouth 2 (two) times daily.   glipiZIDE -metformin  5-500 MG tablet Commonly known as: METAGLIP  Take 1 tablet by mouth 2 (two) times daily before a meal.   guaiFENesin -dextromethorphan  100-10 MG/5ML syrup Commonly known as: ROBITUSSIN DM Take 5 mLs by mouth every 4 (four) hours as needed for cough.    hydrochlorothiazide  25 MG tablet Commonly known as: HYDRODIURIL  Take 25 mg by mouth daily.   ipratropium-albuterol  0.5-2.5 (3) MG/3ML Soln Commonly known as: DUONEB Inhale 3 mLs into the lungs.  Take 3 mLs by nebulization 4 (four) times daily for 360 days What changed: Another medication with the same name was removed. Continue taking this medication, and follow the directions you see here.   isosorbide  mononitrate 60 MG 24 hr tablet Commonly known as: IMDUR  Take 60 mg by mouth daily.   Lantus  SoloStar 100 UNIT/ML Solostar Pen Generic drug: insulin  glargine Inject 6 Units into the skin.  Inject 6 Units subcutaneously at bedtime Only take if blood sugar is over 200 at bedtime.   levothyroxine  125 MCG tablet Commonly known as: SYNTHROID  Take 125 mcg by mouth daily before breakfast.   lidocaine  5 % Commonly known as: LIDODERM  Place 1 patch onto the skin daily. Remove & Discard patch within 12 hours or as directed by MD   loperamide  2 MG capsule Commonly known as: IMODIUM  Take 2 mg by mouth daily.   losartan  100 MG tablet Commonly known as: COZAAR  Take 1 tablet by mouth daily.   meclizine   25 MG tablet Commonly known as: ANTIVERT  Take 25 mg by mouth 3 (three) times daily as needed for dizziness.   meloxicam  15 MG tablet Commonly known as: MOBIC  Take 15 mg by mouth daily.   metoprolol  succinate 25 MG 24 hr tablet Commonly known as: TOPROL -XL Take 25 mg by mouth daily.   montelukast  10 MG tablet Commonly known as: SINGULAIR  Take 10 mg by mouth at bedtime.   nitroGLYCERIN  0.4 MG SL tablet Commonly known as: NITROSTAT  Place 0.4 mg under the tongue every 5 (five) minutes as needed for chest pain.   Novofine Pen Needle 32G X 6 MM Misc Generic drug: Insulin  Pen Needle by Other route See admin instructions.   ondansetron  4 MG disintegrating tablet Commonly known as: ZOFRAN -ODT Take 4 mg by mouth every 8 (eight) hours as needed for nausea or vomiting.   predniSONE  10  MG tablet Commonly known as: DELTASONE  Take 1.5 tablets (15 mg total) by mouth daily with breakfast for 1 day, THEN 1 tablet (10 mg total) daily with breakfast for 1 day, THEN 0.5 tablets (5 mg total) daily with breakfast for 1 day. Start taking on: June 11, 2024 What changed:  medication strength See the new instructions.   pregabalin  75 MG capsule Commonly known as: LYRICA  Take 1 capsule (75 mg total) by mouth 2 (two) times daily.   traMADol  50 MG tablet Commonly known as: ULTRAM  Take 1 tablet (50 mg total) by mouth every 6 (six) hours as needed for moderate pain (pain score 4-6) or severe pain (pain score 7-10). What changed:  how much to take reasons to take this        Contact information for after-discharge care     Destination     [facility] .   Service: Skilled Nursing Contact information: [address] [place]  [address] [phone]                    Discharge Exam: Fredricka Weights   06/02/24 1221  Weight: 90.7 kg   General exam: Appears comfortable and in no distress Respiratory: Clear to auscultation bilaterally Cardiovascular system: S1 & S2 heard, RRR Gastrointestinal system: Abdomen is obese, soft and nontender.   Central nervous system: Alert and oriented. No focal neurological deficits. Extremities: Symmetric 5 x 5 power. Trace LE edema Skin: No rashes, lesions or ulcers  Condition at discharge: good  The results of significant diagnostics from this hospitalization (including imaging, microbiology, ancillary and laboratory) are listed below for reference.   Imaging Studies: DG Chest Port 1 View Result Date: 06/06/2024 CLINICAL DATA:  Interstitial edema. EXAM: PORTABLE CHEST 1 VIEW COMPARISON:  06/02/2024 FINDINGS: Cardiopericardial silhouette is at upper limits of normal for size. Bibasilar streaky opacities suggest atelectasis with small bilateral pleural  effusions. Superimposed pneumonia in the lung bases, notably on the left, is not excluded. No pneumothorax. No acute bony abnormality. IMPRESSION: Bibasilar streaky opacities suggest atelectasis with small bilateral pleural effusions. Superimposed pneumonia in the lung bases, notably on the left, is not excluded. Electronically Signed   By: Camellia Candle M.D.   On: 06/06/2024 09:42   CT Angio Chest Pulmonary Embolism (PE) W or WO Contrast Result Date: 06/03/2024 CLINICAL DATA:  Cough, dyspnea, history of PE EXAM: CT ANGIOGRAPHY CHEST WITH CONTRAST TECHNIQUE: Multidetector CT imaging of the chest was performed using the standard protocol during bolus administration of intravenous contrast. Multiplanar CT image reconstructions and MIPs were obtained to evaluate  the vascular anatomy. RADIATION DOSE REDUCTION: This exam was performed according to the departmental dose-optimization program which includes automated exposure control, adjustment of the mA and/or kV according to patient size and/or use of iterative reconstruction technique. CONTRAST:  75mL OMNIPAQUE  IOHEXOL  350 MG/ML SOLN COMPARISON:  Same day chest radiograph and CT performed December 03, 2022 FINDINGS: Cardiovascular: Satisfactory opacification of the pulmonary arteries to the segmental level. No evidence of pulmonary embolism. No pericardial effusion. Coronary artery calcifications. Scattered aortic calcifications. Mediastinum/Nodes: No lymphadenopathy. Lungs/Pleura: Patchy opacities at the bilateral lung bases. Pleuroparenchymal scarring in the left upper lobe with some adjacent focal areas of ground-glass, new from prior and may represent infection. Unchanged right apical scarring. No pleural effusions. No pneumothorax. Upper Abdomen: No acute findings. Musculoskeletal: No acute osseous findings. Review of the MIP images confirms the above findings. IMPRESSION: 1. No pulmonary embolism identified. 2. Focal ground-glass opacities in the left upper  lobe, favored to represent infection and/or inflammation. Patchy bibasilar opacities, possibly representing atelectasis/scarring although infection is difficult to exclude. Aortic Atherosclerosis (ICD10-I70.0). Electronically Signed   By: Michaeline Blanch M.D.   On: 06/03/2024 10:59   ECHOCARDIOGRAM COMPLETE Result Date: 06/03/2024    ECHOCARDIOGRAM REPORT   Patient Name:   IZABELA OW Date of Exam: 06/03/2024 Medical Rec #:  [id number]      Height:       65.0 in Accession #:    [id number]     Weight:       199.9 lb Date of Birth:  [birth year]      BSA:          1.978 m Patient Age:    72 years       BP:           147/69 mmHg Patient Gender: F              HR:           93 bpm. Exam Location:  [facility] Procedure: 2D Echo, Cardiac Doppler and Color Doppler (Both Spectral and Color            Flow Doppler were utilized during procedure). Indications:     Murmur R01.1  History:         Patient has no prior history of Echocardiogram examinations.                  Signs/Symptoms:Murmur; Risk Factors:Diabetes and Hypertension.                  Anxiety.  Sonographer:     Christopher Furnace Referring Phys:  [id number] CORT ONEIDA MANA Diagnosing Phys: Marsa Dooms MD IMPRESSIONS  1. Left ventricular ejection fraction, by estimation, is 60 to 65%. The left ventricle has normal function. The left ventricle has no regional wall motion abnormalities. Left ventricular diastolic parameters are consistent with Grade I diastolic dysfunction (impaired relaxation).  2. Right ventricular systolic function is normal. The right ventricular size is normal.  3. The mitral valve is normal in structure. No evidence of mitral valve regurgitation. No evidence of mitral stenosis.  4. The aortic valve is normal in structure. Aortic valve regurgitation is not visualized. No aortic stenosis is present.  5. The inferior vena cava is normal in size with greater than 50% respiratory variability, suggesting right atrial pressure of 3 mmHg. FINDINGS  Left  Ventricle: Left ventricular ejection fraction, by estimation, is 60 to 65%. The left ventricle has normal function. The left ventricle has no regional wall motion abnormalities.  Strain was performed and the global longitudinal strain is indeterminate. The left ventricular internal cavity size was normal in size. There is no left ventricular hypertrophy. Left ventricular diastolic parameters are consistent with Grade I diastolic dysfunction (impaired relaxation). Right Ventricle: The right ventricular size is normal. No increase in right ventricular wall thickness. Right ventricular systolic function is normal. Left Atrium: Left atrial size was normal in size. Right Atrium: Right atrial size was normal in size. Pericardium: Trivial pericardial effusion is present. Mitral Valve: The mitral valve is normal in structure. No evidence of mitral valve regurgitation. No evidence of mitral valve stenosis. MV peak gradient, 7.4 mmHg. The mean mitral valve gradient is 4.0 mmHg. Tricuspid Valve: The tricuspid valve is normal in structure. Tricuspid valve regurgitation is not demonstrated. No evidence of tricuspid stenosis. Aortic Valve: The aortic valve is normal in structure. Aortic valve regurgitation is not visualized. No aortic stenosis is present. Aortic valve mean gradient measures 7.5 mmHg. Aortic valve peak gradient measures 13.8 mmHg. Aortic valve area, by VTI measures 2.29 cm. Pulmonic Valve: The pulmonic valve was normal in structure. Pulmonic valve regurgitation is not visualized. No evidence of pulmonic stenosis. Aorta: The aortic root is normal in size and structure. Venous: The inferior vena cava is normal in size with greater than 50% respiratory variability, suggesting right atrial pressure of 3 mmHg. IAS/Shunts: No atrial level shunt detected by color flow Doppler. Additional Comments: 3D was performed not requiring image post processing on an independent workstation and was indeterminate.  LEFT VENTRICLE  PLAX 2D LVIDd:         4.00 cm   Diastology LVIDs:         2.50 cm   LV e' medial:    7.51 cm/s LV PW:         1.20 cm   LV E/e' medial:  12.6 LV IVS:        0.90 cm   LV e' lateral:   9.14 cm/s LVOT diam:     2.00 cm   LV E/e' lateral: 10.4 LV SV:         73 LV SV Index:   37 LVOT Area:     3.14 cm  RIGHT VENTRICLE RV Basal diam:  2.90 cm RV Mid diam:    2.10 cm RV S prime:     19.40 cm/s TAPSE (M-mode): 1.9 cm LEFT ATRIUM             Index        RIGHT ATRIUM           Index LA diam:        3.70 cm 1.87 cm/m   RA Area:     13.70 cm LA Vol (A2C):   36.7 ml 18.56 ml/m  RA Volume:   30.20 ml  15.27 ml/m LA Vol (A4C):   41.5 ml 20.98 ml/m LA Biplane Vol: 40.8 ml 20.63 ml/m  AORTIC VALVE AV Area (Vmax):    2.20 cm AV Area (Vmean):   2.22 cm AV Area (VTI):     2.29 cm AV Vmax:           186.00 cm/s AV Vmean:          127.500 cm/s AV VTI:            0.320 m AV Peak Grad:      13.8 mmHg AV Mean Grad:      7.5 mmHg LVOT Vmax:  130.00 cm/s LVOT Vmean:        90.100 cm/s LVOT VTI:          0.233 m LVOT/AV VTI ratio: 0.73  AORTA Ao Root diam: 2.80 cm MITRAL VALVE                TRICUSPID VALVE MV Area (PHT): 3.99 cm     TR Peak grad:   21.5 mmHg MV Area VTI:   2.41 cm     TR Vmax:        232.00 cm/s MV Peak grad:  7.4 mmHg MV Mean grad:  4.0 mmHg     SHUNTS MV Vmax:       1.36 m/s     Systemic VTI:  0.23 m MV Vmean:      100.0 cm/s   Systemic Diam: 2.00 cm MV Decel Time: 190 msec MV E velocity: 94.90 cm/s MV A velocity: 121.00 cm/s MV E/A ratio:  0.78 Marsa Dooms MD Electronically signed by Marsa Dooms MD Signature Date/Time: 06/03/2024/10:55:47 AM    Final    DG Chest Port 1 View Result Date: 06/02/2024 CLINICAL DATA:  Shortness of breath. EXAM: PORTABLE CHEST 1 VIEW COMPARISON:  11/09/2023. FINDINGS: Bilateral lung fields are clear. Bilateral costophrenic angles are clear. Stable cardio-mediastinal silhouette. No acute osseous abnormalities. The soft tissues are within normal limits.  IMPRESSION: No active disease. Electronically Signed   By: Ree Molt M.D.   On: 06/02/2024 12:55    Microbiology: Results for orders placed or performed during the hospital encounter of 06/02/24  Resp panel by RT-PCR (RSV, Flu A&B, Covid) Anterior Nasal Swab     Status: None   Collection Time: 06/02/24  1:20 PM   Specimen: Anterior Nasal Swab  Result Value Ref Range Status   SARS Coronavirus 2 by RT PCR NEGATIVE NEGATIVE Final    Comment: (NOTE) SARS-CoV-2 target nucleic acids are NOT DETECTED.  The SARS-CoV-2 RNA is generally detectable in upper respiratory specimens during the acute phase of infection. The lowest concentration of SARS-CoV-2 viral copies this assay can detect is 138 copies/mL. A negative result does not preclude SARS-Cov-2 infection and should not be used as the sole basis for treatment or other patient management decisions. A negative result may occur with  improper specimen collection/handling, submission of specimen other than nasopharyngeal swab, presence of viral mutation(s) within the areas targeted by this assay, and inadequate number of viral copies(<138 copies/mL). A negative result must be combined with clinical observations, patient history, and epidemiological information. The expected result is Negative.  Fact Sheet for Patients:  BloggerCourse.com  Fact Sheet for Healthcare Providers:  SeriousBroker.it  This test is no t yet approved or cleared by the [place]  FDA and  has been authorized for detection and/or diagnosis of SARS-CoV-2 by FDA under an Emergency Use Authorization (EUA). This EUA will remain  in effect (meaning this test can be used) for the duration of the COVID-19 declaration under Section 564(b)(1) of the Act, 21 U.S.C.section 360bbb-3(b)(1), unless the authorization is terminated  or revoked sooner.       Influenza A by PCR NEGATIVE NEGATIVE Final   Influenza B by PCR  NEGATIVE NEGATIVE Final    Comment: (NOTE) The Xpert Xpress SARS-CoV-2/FLU/RSV plus assay is intended as an aid in the diagnosis of influenza from Nasopharyngeal swab specimens and should not be used as a sole basis for treatment. Nasal washings and aspirates are unacceptable for Xpert Xpress SARS-CoV-2/FLU/RSV testing.  Fact Sheet for Patients: BloggerCourse.com  Fact Sheet for Healthcare Providers: SeriousBroker.it  This test is not yet approved or cleared by the [place]  FDA and has been authorized for detection and/or diagnosis of SARS-CoV-2 by FDA under an Emergency Use Authorization (EUA). This EUA will remain in effect (meaning this test can be used) for the duration of the COVID-19 declaration under Section 564(b)(1) of the Act, 21 U.S.C. section 360bbb-3(b)(1), unless the authorization is terminated or revoked.     Resp Syncytial Virus by PCR NEGATIVE NEGATIVE Final    Comment: (NOTE) Fact Sheet for Patients: BloggerCourse.com  Fact Sheet for Healthcare Providers: SeriousBroker.it  This test is not yet approved or cleared by the [place]  FDA and has been authorized for detection and/or diagnosis of SARS-CoV-2 by FDA under an Emergency Use Authorization (EUA). This EUA will remain in effect (meaning this test can be used) for the duration of the COVID-19 declaration under Section 564(b)(1) of the Act, 21 U.S.C. section 360bbb-3(b)(1), unless the authorization is terminated or revoked.  Performed at [facility], [address] [place], [place] [address]   Respiratory (~20 pathogens) panel by PCR     Status: None   Collection Time: 06/02/24  6:00 PM   Specimen: Nasopharyngeal Swab; Respiratory  Result Value Ref Range Status   Adenovirus NOT DETECTED NOT DETECTED Final   Coronavirus 229E NOT DETECTED NOT DETECTED Final    Comment: (NOTE) The  Coronavirus on the Respiratory Panel, DOES NOT test for the novel  Coronavirus (2019 nCoV)    Coronavirus HKU1 NOT DETECTED NOT DETECTED Final   Coronavirus NL63 NOT DETECTED NOT DETECTED Final   Coronavirus OC43 NOT DETECTED NOT DETECTED Final   Metapneumovirus NOT DETECTED NOT DETECTED Final   Rhinovirus / Enterovirus NOT DETECTED NOT DETECTED Final   Influenza A NOT DETECTED NOT DETECTED Final   Influenza B NOT DETECTED NOT DETECTED Final   Parainfluenza Virus 1 NOT DETECTED NOT DETECTED Final   Parainfluenza Virus 2 NOT DETECTED NOT DETECTED Final   Parainfluenza Virus 3 NOT DETECTED NOT DETECTED Final   Parainfluenza Virus 4 NOT DETECTED NOT DETECTED Final   Respiratory Syncytial Virus NOT DETECTED NOT DETECTED Final   Bordetella pertussis NOT DETECTED NOT DETECTED Final   Bordetella Parapertussis NOT DETECTED NOT DETECTED Final   Chlamydophila pneumoniae NOT DETECTED NOT DETECTED Final   Mycoplasma pneumoniae NOT DETECTED NOT DETECTED Final    Comment: Performed at [facility], [address] [place], [place] [address]  Culture, blood (Routine X 2) w Reflex to ID Panel     Status: None   Collection Time: 06/03/24  1:27 AM   Specimen: BLOOD  Result Value Ref Range Status   Specimen Description   Final    BLOOD BLOOD LEFT ARM Performed at [facility], [address] [place], [place] [address]    Special Requests   Final    BOTTLES DRAWN AEROBIC AND ANAEROBIC Blood Culture adequate volume Performed at [facility], [address] [place], [place] [address]    Culture   Final    NO GROWTH 5 DAYS Performed at [facility], [address] [place], [place] [address]    Report Status 06/08/2024 FINAL  Final  Culture, blood (Routine X 2) w Reflex to ID Panel     Status: None   Collection Time: 06/03/24  1:27 AM   Specimen: BLOOD  Result Value Ref Range Status   Specimen Description   Final    BLOOD BLOOD  RIGHT ARM Performed at [facility], [address], [place], [place] [address]    Special Requests   Final    BOTTLES DRAWN AEROBIC AND ANAEROBIC Blood Culture adequate volume Performed at [facility], [address], [place], [place] [address]    Culture   Final    NO GROWTH 5 DAYS Performed at [facility], [address] [place], [place] [address]    Report Status 06/08/2024 FINAL  Final  Expectorated Sputum Assessment w Gram Stain, Rflx to Resp Cult     Status: None   Collection Time: 06/03/24  5:20 AM   Specimen: Expectorated Sputum  Result Value Ref Range Status   Specimen Description EXPECTORATED SPUTUM  Final   Special Requests NONE  Final   Sputum evaluation   Final    Sputum specimen not acceptable for testing.  Please recollect.    JANESE ERNESTINA GULLY AT 9389 06/03/24 JG Performed at [facility], [address] [place], [place] [address]    Report Status 06/03/2024 FINAL  Final  Expectorated Sputum Assessment w Gram Stain, Rflx to Resp Cult     Status: None   Collection Time: 06/04/24  2:25 PM   Specimen: Sputum  Result Value Ref Range Status   Specimen Description SPUTUM  Final   Special Requests NONE  Final   Sputum evaluation   Final    THIS SPECIMEN IS ACCEPTABLE FOR SPUTUM CULTURE Performed at [facility], [address], [place], [place] [address]    Report Status 06/04/2024 FINAL  Final  Culture, Respiratory w Gram Stain     Status: None   Collection Time: 06/04/24  2:25 PM   Specimen: SPU  Result Value Ref Range Status   Specimen Description   Final    SPUTUM Performed at [facility], [address], [place], [place] [address]    Special Requests   Final    NONE Reflexed from [id number] Performed at [facility], [address] [place], [place] [address]    Gram Stain   Final    RARE WBC PRESENT, PREDOMINANTLY PMN RARE GRAM POSITIVE COCCI RARE GRAM POSITIVE RODS RARE BUDDING YEAST SEEN    Culture   Final     FEW Normal respiratory flora-no Staph aureus or Pseudomonas seen Performed at [facility], [address], [place], [place] [address]    Report Status 06/07/2024 FINAL  Final    Labs: CBC: Recent Labs  Lab 06/07/24 0556  WBC 15.6*  HGB 13.9  HCT 41.9  MCV 97.9  PLT 303   Basic Metabolic Panel: Recent Labs  Lab 06/04/24 0931 06/05/24 0642 06/06/24 0607 06/07/24 0556 06/08/24 0247  NA 134* 138 138 140 137  K 4.2 4.1 3.8 4.0 4.5  CL 98 101 96* 96* 98  CO2 26 30 31 31 25   GLUCOSE 311* 219* 202* 192* 71  BUN 20 22 28* 31* 37*  CREATININE 0.83 0.70 0.79 0.86 0.91  CALCIUM  8.8* 9.2 9.2 9.2 9.0   Liver Function Tests: No results for input(s): AST, ALT, ALKPHOS, BILITOT, PROT, ALBUMIN in the last 168 hours. CBG: Recent Labs  Lab 06/09/24 1155 06/09/24 1642 06/09/24 2058 06/10/24 0816 06/10/24 1207  GLUCAP 240* 345* 392* 165* 214*    Discharge time spent: greater than 30 minutes.  Signed: Laree Lock, MD [facility] 06/10/2024

## 2024-06-10 NOTE — Telephone Encounter (Signed)
 Patient Product/process development scientist completed.    The patient is insured through [employer]. Patient has Medicare and is not eligible for a copay card, but may be able to apply for patient assistance or Medicare RX Payment Plan (Patient Must reach out to their plan, if eligible for payment plan), if available.    Ran test claim for Trelegy and the current 30 day co-pay is $40.00.  Ran test claim for Breztri  and the current 30 day co-pay is $40.00.  This test claim was processed through [facility]- copay amounts may vary at other pharmacies due to pharmacy/plan contracts, or as the patient moves through the different stages of their insurance plan.     Reyes Sharps, CPHT Pharmacy Technician III Certified Patient Advocate [facility] Pharm[facility] Patient Advocate Team Direct Number: [phone]  Fax: [phone]

## 2024-06-10 NOTE — TOC Progression Note (Deleted)
 Transition of Care [facility]) - Progression Note    Patient Details  Name: Belinda Garcia MRN: [id number] Date of Birth: [birth year]  Transition of [facility] CM/SW Contact  Alvaro Louder, [place] Phone Number: 06/10/2024, 3:10 PM  Clinical Narrative:    LCSWA received insurance approval for patient to admit to SNF [facility]. LCSWA confirmed with MD that patient is stable for discharge. LCSWA notified the patient and they are in agreement with discharge SNF. LCSWA confirmed bed is available at. Transport arranged with [facility] for next available.  Room 608, Number to call report [phone]   TOC to follow for discharge    Barriers to Discharge: No Barriers Identified               Expected Discharge Plan and Services         Expected Discharge Date: 06/10/24                                     Social Drivers of Health (SDOH) Interventions SDOH Screenings   Food Insecurity: Patient Declined (06/03/2024)  Housing: Patient Declined (06/03/2024)  Transportation Needs: Patient Declined (06/03/2024)  Utilities: Not At Risk (01/06/2024)   Received from [facility]  Financial Resource Strain: High Risk (01/06/2024)   Received from [facility]  Physical Activity: Inactive (01/06/2024)   Received from [facility]  Social Connections: Unknown (06/03/2024)  Stress: Stress Concern Present (01/06/2024)   Received from [facility]  Tobacco Use: High Risk (06/02/2024)  Health Literacy: Adequate Health Literacy (01/06/2024)   Received from [facility]    Readmission Risk Interventions     No data to display

## 2024-06-10 NOTE — Progress Notes (Signed)
 Mobility Specialist Progress Note:    06/10/24 1500  Mobility  Activity Refused and notified nurse if applicable   Pt refused d/t having a sore throat. Will attempt on another date. All needs met.  Sherrilee Ditty Mobility Specialist Please contact via Special educational needs teacher or  Rehab office at [phone]

## 2024-06-10 NOTE — Plan of Care (Signed)

## 2024-06-10 NOTE — Progress Notes (Signed)
 Physical Therapy Treatment Patient Details Name: Belinda Garcia MRN: [id number] DOB: 11/25/1950 Today's Date: 06/10/2024   History of Present Illness 73 y.o. female with medical history significant of COPD on as needed oxygen at home, HTN, HLD, IDDM, hypothyroidism, chronic pain on narcotics, obesity, presented with worsening of cough wheezing shortness of breath.    PT Comments  Pt was long sitting in bed upon arrival. She is A and O x 4. Supportive son present throughout session. Pt is well known by author from a previous admission. Pt was on 2L o2 throughout session with one episode of short desaturation. Author questions pulse oximeter accuracy. With dynamap, sao2 > 88% on 2 L. Pt does have minimal SOB observed and was severely limited by fatigue overall. She demonstrates improved abilities to safely exit bed, stand to RW, and tolerate ambulation. Several standing rest breaks. Vcing throughout session for improved technique and sequencing. Energy conservation techniques discussed throughout session. Pt remains not at her baseline abilities. PT recs remain appropriate to maximize her independence and safety with all ADLs. Pt will continue to benefit from skilled PT to maximize her independence and safety with all ADLs while improving activity tolerance.     If plan is discharge home, recommend the following: A little help with walking and/or transfers;A little help with bathing/dressing/bathroom     Equipment Recommendations  Other (comment) (Defer to next level of care)       Precautions / Restrictions Precautions Precautions: Fall Recall of Precautions/Restrictions: Intact Restrictions Weight Bearing Restrictions Per Provider Order: No     Mobility  Bed Mobility Overal bed mobility: Needs Assistance Bed Mobility: Supine to Sit  Supine to sit: Supervision (HOB slightly elevated)   Transfers Overall transfer level: Needs assistance Equipment used: Rolling walker (2  wheels) Transfers: Sit to/from Stand Sit to Stand: Contact guard assist  General transfer comment: Vcs throughout for handplacement, fwd wt shift, and need to monitor sao2 at DC. Pt has home O2 already but per son, does not wear it.    Ambulation/Gait Ambulation/Gait assistance: Contact guard assist Assistive device: Rolling walker (2 wheels) Gait Pattern/deviations: Step-through pattern Gait velocity: decreased  General Gait Details: no LOB or safety concerns however pt does require 2 standing rest and does endorse fatigue.    Balance Overall balance assessment: Needs assistance Sitting-balance support: No upper extremity supported, Feet supported Sitting balance-Leahy Scale: Fair     Standing balance support: Bilateral upper extremity supported, During functional activity, Reliant on assistive device for balance Standing balance-Leahy Scale: Fair      Hotel manager: No apparent difficulties  Cognition Arousal: Alert    Following commands: Intact      Cueing Cueing Techniques: Verbal cues, Tactile cues    PT Goals (current goals can now be found in the care plan section) Acute Rehab PT Goals Patient Stated Goal: get better so I can go home.    Frequency    Min 2X/week       AM-PAC PT 6 Clicks Mobility   Outcome Measure  Help needed turning from your back to your side while in a flat bed without using bedrails?: A Little Help needed moving from lying on your back to sitting on the side of a flat bed without using bedrails?: A Little Help needed moving to and from a bed to a chair (including a wheelchair)?: A Little Help needed standing up from a chair using your arms (e.g., wheelchair or bedside chair)?: A Little Help needed to walk in  hospital room?: A Lot Help needed climbing 3-5 steps with a railing? : A Lot 6 Click Score: 16    End of Session Equipment Utilized During Treatment: Gait belt;Oxygen (2L o2) Activity Tolerance:  Patient tolerated treatment well;Patient limited by fatigue Patient left: in chair;with call bell/phone within reach;with bed alarm set;with family/visitor present;with nursing/sitter in room Nurse Communication: Mobility status PT Visit Diagnosis: Muscle weakness (generalized) (M62.81);Difficulty in walking, not elsewhere classified (R26.2)     Time:  -     Charges:                 Rankin Essex PTA 06/10/24, 6:57 AM

## 2024-06-10 NOTE — Inpatient Diabetes Management (Addendum)
 Inpatient Diabetes Program Recommendations  AACE/ADA: New Consensus Statement on Inpatient Glycemic Control (2015)  Target Ranges:  Prepandial:   less than 140 mg/dL      Peak postprandial:   less than 180 mg/dL (1-2 hours)      Critically ill patients:  140 - 180 mg/dL    Latest Reference Range & Units 06/09/24 07:49 06/09/24 11:55 06/09/24 16:42 06/09/24 20:58 06/10/24 08:16  Glucose-Capillary 70 - 99 mg/dL 832 (H) 759 (H) 654 (H) 392 (H) 165 (H)    Latest Reference Range & Units 06/08/24 07:53 06/08/24 11:41 06/08/24 16:19 06/08/24 20:52  Glucose-Capillary 70 - 99 mg/dL 862 (H) 699 (H) 687 (H) 319 (H)   Review of Glycemic Control  Diabetes history: DM2 Outpatient Diabetes medications:  Metaglip  5/500 mg 1 tablet BID,Lantus  6 units at HS for CBG >200  Current orders for Inpatient glycemic control: Lantus  20 units at bedtime, Novolog  0-20 units AC&HS, Novolog  12 units TID with meals; Prednisone  15 mg daily (taper; got 20 mg today)  Inpatient Diabetes Program Recommendations:    Insulin : If steroids are continued, please consider increasing meal coverage to Novolog  15 units TID with meals if patient eats at least 50% of meals.  Thanks, Earnie Gainer, RN, MSN, CDCES Diabetes Coordinator Inpatient Diabetes Program [phone] (Team Pager from 8am to 5pm)

## 2024-07-18 ENCOUNTER — Other Ambulatory Visit: Payer: Self-pay | Admitting: Family Medicine

## 2024-07-18 DIAGNOSIS — Z1231 Encounter for screening mammogram for malignant neoplasm of breast: Secondary | ICD-10-CM

## 2024-08-19 ENCOUNTER — Ambulatory Visit
Admission: RE | Admit: 2024-08-19 | Discharge: 2024-08-19 | Disposition: A | Discharge status: Home / Self Care | Source: Ambulatory Visit | Attending: Family Medicine | Admitting: Family Medicine

## 2024-08-19 DIAGNOSIS — Z1231 Encounter for screening mammogram for malignant neoplasm of breast: Secondary | ICD-10-CM | POA: Diagnosis present

## 2024-09-15 ENCOUNTER — Other Ambulatory Visit: Payer: Self-pay

## 2024-09-15 ENCOUNTER — Inpatient Hospital Stay
Admission: EM | Admit: 2024-09-15 | Discharge: 2024-09-17 | DRG: 640 | Disposition: A | Attending: Internal Medicine | Admitting: Internal Medicine

## 2024-09-15 ENCOUNTER — Emergency Department

## 2024-09-15 DIAGNOSIS — E66811 Obesity, class 1: Secondary | ICD-10-CM | POA: Diagnosis present

## 2024-09-15 DIAGNOSIS — R296 Repeated falls: Secondary | ICD-10-CM | POA: Diagnosis present

## 2024-09-15 DIAGNOSIS — Z6833 Body mass index (BMI) 33.0-33.9, adult: Secondary | ICD-10-CM | POA: Diagnosis not present

## 2024-09-15 DIAGNOSIS — R4182 Altered mental status, unspecified: Secondary | ICD-10-CM

## 2024-09-15 DIAGNOSIS — E119 Type 2 diabetes mellitus without complications: Secondary | ICD-10-CM | POA: Diagnosis present

## 2024-09-15 DIAGNOSIS — E871 Hypo-osmolality and hyponatremia: Secondary | ICD-10-CM | POA: Diagnosis present

## 2024-09-15 DIAGNOSIS — Z9081 Acquired absence of spleen: Secondary | ICD-10-CM | POA: Diagnosis not present

## 2024-09-15 DIAGNOSIS — R519 Headache, unspecified: Principal | ICD-10-CM

## 2024-09-15 DIAGNOSIS — Z79890 Hormone replacement therapy (postmenopausal): Secondary | ICD-10-CM | POA: Diagnosis not present

## 2024-09-15 DIAGNOSIS — F419 Anxiety disorder, unspecified: Secondary | ICD-10-CM | POA: Diagnosis present

## 2024-09-15 DIAGNOSIS — Z7982 Long term (current) use of aspirin: Secondary | ICD-10-CM | POA: Diagnosis not present

## 2024-09-15 DIAGNOSIS — Z791 Long term (current) use of non-steroidal anti-inflammatories (NSAID): Secondary | ICD-10-CM | POA: Diagnosis not present

## 2024-09-15 DIAGNOSIS — H538 Other visual disturbances: Secondary | ICD-10-CM

## 2024-09-15 DIAGNOSIS — M545 Low back pain, unspecified: Secondary | ICD-10-CM | POA: Diagnosis present

## 2024-09-15 DIAGNOSIS — G928 Other toxic encephalopathy: Secondary | ICD-10-CM | POA: Diagnosis present

## 2024-09-15 DIAGNOSIS — E7800 Pure hypercholesterolemia, unspecified: Secondary | ICD-10-CM | POA: Diagnosis present

## 2024-09-15 DIAGNOSIS — M797 Fibromyalgia: Secondary | ICD-10-CM | POA: Diagnosis present

## 2024-09-15 DIAGNOSIS — M170 Bilateral primary osteoarthritis of knee: Secondary | ICD-10-CM | POA: Diagnosis present

## 2024-09-15 DIAGNOSIS — G8929 Other chronic pain: Secondary | ICD-10-CM | POA: Diagnosis present

## 2024-09-15 DIAGNOSIS — Z794 Long term (current) use of insulin: Secondary | ICD-10-CM | POA: Diagnosis not present

## 2024-09-15 DIAGNOSIS — F17210 Nicotine dependence, cigarettes, uncomplicated: Secondary | ICD-10-CM | POA: Diagnosis present

## 2024-09-15 DIAGNOSIS — I110 Hypertensive heart disease with heart failure: Secondary | ICD-10-CM | POA: Diagnosis present

## 2024-09-15 DIAGNOSIS — K760 Fatty (change of) liver, not elsewhere classified: Secondary | ICD-10-CM | POA: Diagnosis present

## 2024-09-15 DIAGNOSIS — Z86711 Personal history of pulmonary embolism: Secondary | ICD-10-CM | POA: Diagnosis not present

## 2024-09-15 DIAGNOSIS — G9341 Metabolic encephalopathy: Secondary | ICD-10-CM | POA: Diagnosis not present

## 2024-09-15 DIAGNOSIS — Z87820 Personal history of traumatic brain injury: Secondary | ICD-10-CM | POA: Diagnosis not present

## 2024-09-15 DIAGNOSIS — J4489 Other specified chronic obstructive pulmonary disease: Secondary | ICD-10-CM | POA: Diagnosis present

## 2024-09-15 DIAGNOSIS — I5032 Chronic diastolic (congestive) heart failure: Secondary | ICD-10-CM | POA: Diagnosis present

## 2024-09-15 LAB — URINALYSIS, W/ REFLEX TO CULTURE (INFECTION SUSPECTED)
Bacteria, UA: NONE SEEN
Bilirubin Urine: NEGATIVE
Glucose, UA: NEGATIVE mg/dL
Hgb urine dipstick: NEGATIVE
Ketones, ur: NEGATIVE mg/dL
Leukocytes,Ua: NEGATIVE
Nitrite: NEGATIVE
Protein, ur: NEGATIVE mg/dL
RBC / HPF: 0 RBC/hpf (ref 0–5)
Specific Gravity, Urine: 1.02 (ref 1.005–1.030)
pH: 6 (ref 5.0–8.0)

## 2024-09-15 LAB — URINE DRUG SCREEN
Amphetamines: NEGATIVE
Barbiturates: NEGATIVE
Benzodiazepines: POSITIVE — AB
Cocaine: NEGATIVE
Fentanyl: NEGATIVE
Methadone Scn, Ur: NEGATIVE
Opiates: NEGATIVE
Tetrahydrocannabinol: NEGATIVE

## 2024-09-15 LAB — BLOOD GAS, VENOUS
Acid-Base Excess: 2.6 mmol/L — ABNORMAL HIGH (ref 0.0–2.0)
Bicarbonate: 29.7 mmol/L — ABNORMAL HIGH (ref 20.0–28.0)
O2 Saturation: 59.4 %
Patient temperature: 37
pCO2, Ven: 55 mmHg (ref 44–60)
pH, Ven: 7.34 (ref 7.25–7.43)
pO2, Ven: 33 mmHg (ref 32–45)

## 2024-09-15 LAB — COMPREHENSIVE METABOLIC PANEL WITH GFR
ALT: 28 U/L (ref 0–44)
AST: 48 U/L — ABNORMAL HIGH (ref 15–41)
Albumin: 3.9 g/dL (ref 3.5–5.0)
Alkaline Phosphatase: 94 U/L (ref 38–126)
Anion gap: 10 (ref 5–15)
BUN: 11 mg/dL (ref 8–23)
CO2: 27 mmol/L (ref 22–32)
Calcium: 9.5 mg/dL (ref 8.9–10.3)
Chloride: 84 mmol/L — ABNORMAL LOW (ref 98–111)
Creatinine, Ser: 0.92 mg/dL (ref 0.44–1.00)
GFR, Estimated: >60 mL/min (ref >60)
Glucose, Bld: 135 mg/dL — ABNORMAL HIGH (ref 70–99)
Potassium: 4.7 mmol/L (ref 3.5–5.1)
Sodium: 121 mmol/L — ABNORMAL LOW (ref 135–145)
Total Bilirubin: 0.4 mg/dL (ref 0.0–1.2)
Total Protein: 6.1 g/dL — ABNORMAL LOW (ref 6.5–8.1)

## 2024-09-15 LAB — CBC WITH DIFFERENTIAL/PLATELET
Abs Immature Granulocytes: 0.05 K/uL (ref 0.00–0.07)
Basophils Absolute: 0.1 K/uL (ref 0.0–0.1)
Basophils Relative: 1 %
Eosinophils Absolute: 0.4 K/uL (ref 0.0–0.5)
Eosinophils Relative: 3 %
HCT: 41.1 % (ref 36.0–46.0)
Hemoglobin: 13.9 g/dL (ref 12.0–15.0)
Immature Granulocytes: 1 %
Lymphocytes Relative: 26 %
Lymphs Abs: 2.7 K/uL (ref 0.7–4.0)
MCH: 31.9 pg (ref 26.0–34.0)
MCHC: 33.8 g/dL (ref 30.0–36.0)
MCV: 94.3 fL (ref 80.0–100.0)
Monocytes Absolute: 1 K/uL (ref 0.1–1.0)
Monocytes Relative: 10 %
Neutro Abs: 6.1 K/uL (ref 1.7–7.7)
Neutrophils Relative %: 59 %
Platelets: 430 K/uL — ABNORMAL HIGH (ref 150–400)
RBC: 4.36 MIL/uL (ref 3.87–5.11)
RDW: 12.9 % (ref 11.5–15.5)
WBC: 10.3 K/uL (ref 4.0–10.5)
nRBC: 0 % (ref 0.0–0.2)

## 2024-09-15 LAB — AMMONIA: Ammonia: 32 umol/L (ref 9–35)

## 2024-09-15 LAB — TROPONIN T, HIGH SENSITIVITY
Troponin T High Sensitivity: 21 ng/L — ABNORMAL HIGH (ref 0–19)
Troponin T High Sensitivity: 22 ng/L — ABNORMAL HIGH (ref 0–19)

## 2024-09-15 LAB — ETHANOL: Alcohol, Ethyl (B): <15 mg/dL (ref <15)

## 2024-09-15 MED ORDER — ACETAMINOPHEN 325 MG PO TABS: 650.0000 mg | ORAL_TABLET | Freq: Four times a day (QID) | ORAL | Status: DC | PRN

## 2024-09-15 MED ORDER — ONDANSETRON HCL 4 MG PO TABS: 4.0000 mg | ORAL_TABLET | Freq: Four times a day (QID) | ORAL | Status: DC | PRN

## 2024-09-15 MED ORDER — OXYCODONE HCL 5 MG PO TABS: 5.0000 mg | ORAL_TABLET | Freq: Three times a day (TID) | ORAL | Status: DC | PRN

## 2024-09-15 MED ORDER — METOPROLOL SUCCINATE ER 25 MG PO TB24
25.0000 mg | ORAL_TABLET | Freq: Every day | ORAL | Status: DC
  Administered 2024-09-17: 25 mg via ORAL
  Filled 2024-09-15 (×2): qty 1

## 2024-09-15 MED ORDER — MONTELUKAST SODIUM 10 MG PO TABS
10.0000 mg | ORAL_TABLET | Freq: Every day | ORAL | Status: DC
  Administered 2024-09-15 – 2024-09-16 (×2): 10 mg via ORAL
  Filled 2024-09-15 (×2): qty 1

## 2024-09-15 MED ORDER — SODIUM CHLORIDE 0.9 % IV BOLUS
500.0000 mL | Freq: Once | INTRAVENOUS | Status: AC
  Administered 2024-09-15: 500 mL via INTRAVENOUS

## 2024-09-15 MED ORDER — DIAZEPAM 5 MG PO TABS
10.0000 mg | ORAL_TABLET | Freq: Every evening | ORAL | Status: DC | PRN
  Administered 2024-09-16: 10 mg via ORAL
  Filled 2024-09-15: qty 2

## 2024-09-15 MED ORDER — SODIUM CHLORIDE 0.9 % IV SOLN: INTRAVENOUS | Status: AC

## 2024-09-15 MED ORDER — DULOXETINE HCL 30 MG PO CPEP
60.0000 mg | ORAL_CAPSULE | Freq: Every day | ORAL | Status: DC
  Administered 2024-09-16 – 2024-09-17 (×2): 60 mg via ORAL
  Filled 2024-09-15: qty 1
  Filled 2024-09-15: qty 2

## 2024-09-15 MED ORDER — ACETAMINOPHEN 650 MG RE SUPP: 650.0000 mg | Freq: Four times a day (QID) | RECTAL | Status: DC | PRN

## 2024-09-15 MED ORDER — SODIUM CHLORIDE 0.9 % IV BOLUS
1000.0000 mL | Freq: Once | INTRAVENOUS | Status: AC
  Administered 2024-09-15: 1000 mL via INTRAVENOUS

## 2024-09-15 MED ORDER — ENOXAPARIN SODIUM 40 MG/0.4ML IJ SOSY
40.0000 mg | PREFILLED_SYRINGE | INTRAMUSCULAR | Status: DC
  Administered 2024-09-16 – 2024-09-17 (×2): 40 mg via SUBCUTANEOUS
  Filled 2024-09-15 (×2): qty 0.4

## 2024-09-15 MED ORDER — POLYETHYLENE GLYCOL 3350 17 G PO PACK: 17.0000 g | PACK | Freq: Every day | ORAL | Status: DC | PRN

## 2024-09-15 MED ORDER — NAPROXEN 500 MG PO TABS
250.0000 mg | ORAL_TABLET | Freq: Three times a day (TID) | ORAL | Status: DC
  Administered 2024-09-16 – 2024-09-17 (×4): 250 mg via ORAL
  Filled 2024-09-15 (×7): qty 1

## 2024-09-15 MED ORDER — ISOSORBIDE MONONITRATE ER 30 MG PO TB24
60.0000 mg | ORAL_TABLET | Freq: Every day | ORAL | Status: DC
  Administered 2024-09-16 – 2024-09-17 (×2): 60 mg via ORAL
  Filled 2024-09-15: qty 2
  Filled 2024-09-15: qty 1

## 2024-09-15 MED ORDER — METOCLOPRAMIDE HCL 5 MG/ML IJ SOLN
10.0000 mg | Freq: Once | INTRAMUSCULAR | Status: AC
  Administered 2024-09-15: 10 mg via INTRAVENOUS
  Filled 2024-09-15: qty 2

## 2024-09-15 MED ORDER — ATORVASTATIN CALCIUM 20 MG PO TABS
40.0000 mg | ORAL_TABLET | Freq: Every day | ORAL | Status: DC
  Administered 2024-09-16: 40 mg via ORAL
  Filled 2024-09-15: qty 2

## 2024-09-15 MED ORDER — PANTOPRAZOLE SODIUM 40 MG PO TBEC
40.0000 mg | DELAYED_RELEASE_TABLET | Freq: Every day | ORAL | Status: DC
  Administered 2024-09-16 – 2024-09-17 (×2): 40 mg via ORAL
  Filled 2024-09-15 (×2): qty 1

## 2024-09-15 MED ORDER — BUDESON-GLYCOPYRROL-FORMOTEROL 160-9-4.8 MCG/ACT IN AERO
2.0000 | INHALATION_SPRAY | Freq: Two times a day (BID) | RESPIRATORY_TRACT | Status: DC
  Administered 2024-09-15 – 2024-09-17 (×4): 2 via RESPIRATORY_TRACT
  Filled 2024-09-15: qty 5.9

## 2024-09-15 MED ORDER — IOHEXOL 350 MG/ML SOLN
75.0000 mL | Freq: Once | INTRAVENOUS | Status: AC | PRN
  Administered 2024-09-15: 75 mL via INTRAVENOUS

## 2024-09-15 MED ORDER — LEVOTHYROXINE SODIUM 50 MCG PO TABS
125.0000 ug | ORAL_TABLET | Freq: Every day | ORAL | Status: DC
  Administered 2024-09-16 – 2024-09-17 (×2): 125 ug via ORAL
  Filled 2024-09-15 (×2): qty 1

## 2024-09-15 MED ORDER — IPRATROPIUM-ALBUTEROL 0.5-2.5 (3) MG/3ML IN SOLN: 3.0000 mL | Freq: Four times a day (QID) | RESPIRATORY_TRACT | Status: DC | PRN

## 2024-09-15 MED ORDER — PREGABALIN 75 MG PO CAPS
75.0000 mg | ORAL_CAPSULE | Freq: Two times a day (BID) | ORAL | Status: DC
  Administered 2024-09-15 – 2024-09-17 (×4): 75 mg via ORAL
  Filled 2024-09-15 (×4): qty 1

## 2024-09-15 MED ORDER — ROFLUMILAST 500 MCG PO TABS
500.0000 ug | ORAL_TABLET | Freq: Every day | ORAL | Status: DC
  Administered 2024-09-16 – 2024-09-17 (×2): 500 ug via ORAL
  Filled 2024-09-15 (×2): qty 1

## 2024-09-15 MED ORDER — ONDANSETRON HCL 4 MG/2ML IJ SOLN: 4.0000 mg | Freq: Four times a day (QID) | INTRAMUSCULAR | Status: DC | PRN

## 2024-09-15 MED FILL — Aspirin Tab Delayed Release 81 MG: 81.0000 mg | ORAL | Qty: 1 | Status: AC

## 2024-09-15 NOTE — ED Notes (Signed)
Placed Pt on 2L NC

## 2024-09-15 NOTE — ED Notes (Signed)
 This RN and Janeeta, EDT helped patient use the bedpan to collect a urine sample. Patient was unable to urinate. Janeeta, EDT did an in and out cath on the patient to collect the urine sample. A complete linen change was done and patient was placed in a comfortable position. Bed in lowest position with call bell in reach.
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
## 2024-09-15 NOTE — ED Provider Notes (Signed)
 [facility] Provider Note    Event Date/Time   First MD Initiated Contact with Patient 09/15/24 1400     (approximate)   History   Headache   HPI  Belinda Garcia is a 73 y.o. female with history of COPD, diabetes, chronic pain on tramadol , pregabalin , anxiety on as needed Valium , presenting with altered mental status.  Patient states that she when she woke up today, she had a headache, unable to pinpoint where the headache is located, also noted blurry vision bilaterally.  She denies any nausea vomiting, she denies any focal weakness or numbness. Is on tramadol , states that she took 2 this morning.  She denies any chest pain or shortness of breath, no cough or urinary symptoms, no abdominal pain or nausea vomiting diarrhea.  Denies incontinence or saddle anesthesia.  States that she was feeling generally weak yesterday, fell and her son caught her, no head strike or trauma.  States that she was normal last night when she went out to eat with her son.  Per independent history from EMS, they noted that she has slurred speech at baseline and this is her baseline.  States that unclear if she took more tramadol  than prescribed.  She was intermittently falling asleep on them while they were bringing her to the ED.  No meds were given.      Physical Exam   Triage Vital Signs: ED Triage Vitals  Encounter Vitals Group     BP 09/15/24 1404 114/71     Girls Systolic BP Percentile --      Girls Diastolic BP Percentile --      Boys Systolic BP Percentile --      Boys Diastolic BP Percentile --      Pulse Rate 09/15/24 1404 65     Resp 09/15/24 1404 20     Temp --      Temp src --      SpO2 09/15/24 1404 97 %     Weight 09/15/24 1405 199 lb 15.3 oz (90.7 kg)     Height 09/15/24 1405 5' 5 (1.651 m)     Head Circumference --      Peak Flow --      Pain Score --      Pain Loc --      Pain Education --      Exclude from Growth Chart --     Most recent vital  signs: Vitals:   09/15/24 1404 09/15/24 1500  BP: 114/71 101/60  Pulse: 65 65  Resp: 20 17  SpO2: 97% 99%     General: Patient's slightly sleepy, wakes up to voice CV:  Good peripheral perfusion.  Resp:  Normal effort.  No tachypnea or respiratory distress Abd:  No distention.  Soft nontender Other:  Pupils equal reactive, extraocular limbs are intact, no obvious facial droop, she is able to dorsi and plantarflex bilaterally without focal weakness, grip strength intact bilaterally.  Speech sounded very mildly slurred.  Her mucous membranes are dry.   ED Results / Procedures / Treatments   Labs (all labs ordered are listed, but only abnormal results are displayed) Labs Reviewed  COMPREHENSIVE METABOLIC PANEL WITH GFR - Abnormal; Notable for the following components:      Result Value   Sodium 121 (*)    Chloride 84 (*)    Glucose, Bld 135 (*)    Total Protein 6.1 (*)    AST 48 (*)    All  other components within normal limits  CBC WITH DIFFERENTIAL/PLATELET - Abnormal; Notable for the following components:   Platelets 430 (*)    All other components within normal limits  BLOOD GAS, VENOUS - Abnormal; Notable for the following components:   Bicarbonate 29.7 (*)    Acid-Base Excess 2.6 (*)    All other components within normal limits  TROPONIN T, HIGH SENSITIVITY - Abnormal; Notable for the following components:   Troponin T High Sensitivity 21 (*)    All other components within normal limits  ETHANOL  AMMONIA  URINALYSIS, W/ REFLEX TO CULTURE (INFECTION SUSPECTED)  URINE DRUG SCREEN     EKG  EKG shows, sinus rhythm heart rate 67, normal QS, normal QTc, no obvious ischemic ST elevation, T wave version to V2, this change compared to prior     PROCEDURES:  Critical Care performed: No  Procedures   MEDICATIONS ORDERED IN ED: Medications  metoCLOPramide (REGLAN) injection 10 mg (10 mg Intravenous Given 09/15/24 1454)  sodium chloride  0.9 % bolus 500 mL (500 mLs  Intravenous New Bag/Given 09/15/24 1454)  iohexol  (OMNIPAQUE ) 350 MG/ML injection 75 mL (75 mLs Intravenous Contrast Given 09/15/24 1426)     IMPRESSION / MDM / ASSESSMENT AND PLAN / ED COURSE  I reviewed the triage vital signs and the nursing notes.                              Differential diagnosis includes, but is not limited to, medication side effect, polypharmacy, electrolyte derangements, underlying infection, atypical ACS, did consider CVA but patient has no obvious focal deficits on exam other than blurry vision.  Did consider atypical migraine, tension headache, viral illness, subarachnoid hemorrhage, intracranial hemorrhage.  Also consider hypercapnia.  Will get labs, EKG, troponin, UA, CT angio head and neck.  Urine drug screen, ethanol level.  Ammonia level.  VBG.  Did call CT to expedite CT angio, they will come and get her.  Patient's presentation is most consistent with acute presentation with potential threat to life or bodily function.  Independent interpretation of labs below.  Labs notable for hyponatremia, given dry mucous membranes, could be hypovolemic hyponatremia.  She is getting fluids. Patient signed out pending CT imaging result and rest of labs, will need to be admitted for further management of her hyponatremia.     Clinical Course as of 09/15/24 1508  Thu Sep 15, 2024  1434 Tried calling sons for collateral information but no reply. [TT]  1506 Independent review of labs, she is acutely hyponatremic, urine is normal, AST is mildly elevated, rest of LFTs are normal, no leukocytosis, ethanol levels are not elevated, she is not acutely acidotic, not hypercarbic, troponins mildly elevated, ammonia is normal. [TT]    Clinical Course User Index [TT] Waymond, Lorelle Cummins, MD     FINAL CLINICAL IMPRESSION(S) / ED DIAGNOSES   Final diagnoses:  Acute nonintractable headache, unspecified headache type  Blurry vision  Altered mental status, unspecified altered mental  status type  Hyponatremia     Rx / DC Orders   ED Discharge Orders     None        Note:  This document was prepared using Dragon voice recognition software and may include unintentional dictation errors.    Waymond Lorelle Cummins, MD 09/15/24 [phone]

## 2024-09-15 NOTE — ED Notes (Signed)
 Removed O2. Patient on Room air remaining in 90s for oxygen saturation.

## 2024-09-15 NOTE — ED Triage Notes (Signed)
 Pt coming from home.  Called this morning for headache and blurred vision that started when she woke up. Patient falling asleep during conversation. Pt takes tramadol  for pain and she took two this morning. Slurred speech but apparently normal for patient. Sister was at the house with patient and states that patient was able to swallow pills this morning. Patient lives alone. Pt lethargic and hard to arouse.   Pt had a fall yesterday, son caught her.  Pt was normal last night and went out to eat with son.

## 2024-09-16 LAB — BASIC METABOLIC PANEL WITH GFR
Anion gap: 7 (ref 5–15)
BUN: 9 mg/dL (ref 8–23)
CO2: 29 mmol/L (ref 22–32)
Calcium: 9 mg/dL (ref 8.9–10.3)
Chloride: 91 mmol/L — ABNORMAL LOW (ref 98–111)
Creatinine, Ser: 0.83 mg/dL (ref 0.44–1.00)
GFR, Estimated: >60 mL/min (ref >60)
Glucose, Bld: 142 mg/dL — ABNORMAL HIGH (ref 70–99)
Potassium: 4.6 mmol/L (ref 3.5–5.1)
Sodium: 127 mmol/L — ABNORMAL LOW (ref 135–145)

## 2024-09-16 MED ADMIN — Aspirin Tab Delayed Release 81 MG: 81 mg | ORAL | NDC 77333003125

## 2024-09-16 NOTE — Evaluation (Addendum)
 Occupational Therapy Evaluation Patient Details Name: Belinda Garcia MRN: [id number] DOB: 02/03/1951 Today's Date: 09/16/2024   History of Present Illness   Pt is a73 year old female admitted with hyponatremia, acute toxic metabolic encephalopathy, headache (traumatic from fall), recent diarrhea      PMH significant for chronic low back pain, chronic knee pain from arthritis, cervical radiculopathy, COPD, diabetes mellitus, fatty liver, fibromyalgia, frequent falls, multiple concussions from falls, hypertension, depression, history of PE, traumatic subdural hematoma.     Clinical Impressions Chart reviewed to date, pt greeted semi supine in bed, oriented to self and place. Increased time for one step directions. PTA pt reports she has PRN assist for ADLs from her son, assist for IADLs. She amb household distances with [facility] per her report. On this date, pt is presenting with defiic in strength, endurance, activity tolerance, balance, cognition, affecting safe and optimal ADL completion See further detials below. Pt is performing ADL/functional mobility below PLOF, will benefit from acute OT to address functoinal deficits to facilitate optimal/ADL functional mobility performance. Pt is left semi supine in bed, offloaded to R, all needs met. OT will follow.   Pt reports dizziness with position changes  112.45 in semi supine  135/45 in sitting 107/45 back in semi supine     If plan is discharge home, recommend the following:   A lot of help with walking and/or transfers;A lot of help with bathing/dressing/bathroom;Supervision due to cognitive status     Functional Status Assessment   Patient has had a recent decline in their functional status and demonstrates the ability to make significant improvements in function in a reasonable and predictable amount of time.     Equipment Recommendations   Other (comment) (defer to next venue of care)     Recommendations for Other Services          Precautions/Restrictions   Precautions Precautions: Fall Recall of Precautions/Restrictions: Impaired Restrictions Weight Bearing Restrictions Per Provider Order: No     Mobility Bed Mobility Overal bed mobility: Needs Assistance Bed Mobility: Supine to Sit, Sit to Supine, Rolling Rolling: Mod assist, Used rails   Supine to sit: Mod assist, Used rails Sit to supine: Mod assist   General bed mobility comments: management of BLE    Transfers Overall transfer level: Needs assistance Equipment used: Rolling walker (2 wheels) Transfers: Sit to/from Stand Sit to Stand: Min assist, Mod assist           General transfer comment: frequent multi modal cues for technique, lateral scoots up the bed with CGA to the L      Balance Overall balance assessment: Needs assistance Sitting-balance support: Feet supported Sitting balance-Leahy Scale: Fair     Standing balance support: Bilateral upper extremity supported, During functional activity, Reliant on assistive device for balance Standing balance-Leahy Scale: Poor                             ADL either performed or assessed with clinical judgement   ADL Overall ADL's : Needs assistance/impaired Eating/Feeding: Set up;Sitting                   Lower Body Dressing: Maximal assistance;Sitting/lateral leans Lower Body Dressing Details (indicate cue type and reason): doff/donn brief         Tub/ Shower Transfer: Maximal assistance           Vision Patient Visual Report: No change from baseline  Perception         Praxis         Pertinent Vitals/Pain Pain Assessment Pain Assessment: 0-10 Pain Score: 7  Pain Location: knees, chronic Pain Descriptors / Indicators: Discomfort Pain Intervention(s): Monitored during session, Limited activity within patient's tolerance, Repositioned     Extremity/Trunk Assessment Upper Extremity Assessment Upper Extremity Assessment:  Generalized weakness   Lower Extremity Assessment Lower Extremity Assessment: Defer to PT evaluation       Communication Communication Communication: Impaired Factors Affecting Communication: Difficulty expressing self   Cognition Arousal: Alert Behavior During Therapy: WFL for tasks assessed/performed, Flat affect Cognition: Cognition impaired   Orientation impairments: Situation, Time Awareness: Intellectual awareness impaired Memory impairment (select all impairments): Declarative long-term memory Attention impairment (select first level of impairment): Sustained attention Executive functioning impairment (select all impairments): Sequencing, Reasoning, Problem solving OT - Cognition Comments: will continue to assess                 Following commands: Impaired Following commands impaired: Follows one step commands with increased time     Cueing  General Comments   Cueing Techniques: Verbal cues;Gestural cues;Tactile cues;Visual cues  bruising noted in sacral area, ?breakdown, nurse notified   Exercises Other Exercises Other Exercises: edu re role of OT, role of rehab, discharge recommendations   Shoulder Instructions      Home Living Family/patient expects to be discharged to:: Private residence Living Arrangements: Children Available Help at Discharge: Family;Available 24 hours/day Type of Home: House Home Access: Stairs to enter [employer] of Steps: 6 Entrance Stairs-Rails: Left;Right Home Layout: Two level;Able to live on main level with bedroom/bathroom Alternate Level Stairs-Number of Steps: 12   Bathroom Shower/Tub: Walk-in shower         Home Equipment: Cane - single Librarian, Academic (2 wheels);Educational Psychologist (4 wheels)          Prior Functioning/Environment Prior Level of Function : Needs assist;History of Falls (last six months)             Mobility Comments: amb with SPC in      OT Problem List:  Decreased strength;Decreased activity tolerance;Impaired balance (sitting and/or standing);Decreased safety awareness;Decreased cognition;Decreased knowledge of use of DME or AE;Decreased knowledge of precautions;Cardiopulmonary status limiting activity   OT Treatment/Interventions: Self-care/ADL training;Therapeutic exercise;Energy conservation;DME and/or AE instruction;Patient/family education;Cognitive remediation/compensation;Therapeutic activities;Balance training      OT Goals(Current goals can be found in the care plan section)   Acute Rehab OT Goals Patient Stated Goal: rehab OT Goal Formulation: With patient Time For Goal Achievement: 09/30/24 Potential to Achieve Goals: Good ADL Goals Pt Will Perform Grooming: with modified independence;sitting Pt Will Perform Lower Body Dressing: with modified independence;sitting/lateral leans Pt Will Transfer to Toilet: with modified independence Pt Will Perform Toileting - Clothing Manipulation and hygiene: with modified independence;sitting/lateral leans;sit to/from stand   OT Frequency:  Min 2X/week    Co-evaluation PT/OT/SLP Co-Evaluation/Treatment: Yes Reason for Co-Treatment: Complexity of the patient's impairments (multi-system involvement)   OT goals addressed during session: ADL's and self-care      AM-PAC OT 6 Clicks Daily Activity     Outcome Measure Help from another person eating meals?: None Help from another person taking care of personal grooming?: None Help from another person toileting, which includes using toliet, bedpan, or urinal?: A Lot Help from another person bathing (including washing, rinsing, drying)?: A Lot Help from another person to put on and taking off regular upper body clothing?: A Little Help from another person  to put on and taking off regular lower body clothing?: A Lot 6 Click Score: 17   End of Session Equipment Utilized During Treatment: Rolling walker (2 wheels) Nurse Communication:  Mobility status  Activity Tolerance: Patient tolerated treatment well Patient left: in bed;with call bell/phone within reach;with bed alarm set  OT Visit Diagnosis: Other abnormalities of gait and mobility (R26.89);Muscle weakness (generalized) (M62.81);Other symptoms and signs involving cognitive function                Time: 1355-1412 OT Time Calculation (min): 17 min Charges:  OT General Charges $OT Visit: 1 Visit OT Evaluation $OT Eval Moderate Complexity: 1 Mod  Therisa Sheffield, OTD OTR/L  09/16/2024, 2:48 PM

## 2024-09-16 NOTE — ED Notes (Signed)
 While sitting at the desk RN Rosina and myself could hear yelling and cursing coming from room 36. I entered the room and asked if everything was ok and he would calm down. The pt's younger son in the room advised everything was ok. I then shut the door and just moments later could hear the female voice yelling again. Security was called to the room. The pt's son advised the pt he was going to leave before he got in trouble. Security entered the doorway and asked the pt's son if he was ready to leave. The pt's son told his Mom bye and exited the room with security heading to the lobby. The pt advised that her two sons did not get along. The pt denied being in an unsafe situation. The pt refused any additional resources at this time.

## 2024-09-16 NOTE — ED Notes (Signed)
Pt is sleeping.  No distress noted.

## 2024-09-16 NOTE — Progress Notes (Signed)
 Progress Note    Belinda Garcia  FMW:[id number] DOB: [birth year]  DOA: 09/15/2024 PCP: Valora Lynwood FALCON, MD      Brief Narrative:    Medical records reviewed and are as summarized below:  Belinda Garcia is a 73 y.o. female with multiple medical problems including but not limited to chronic low back pain, chronic knee pain from arthritis, cervical radiculopathy, COPD, diabetes mellitus, fatty liver, fibromyalgia, frequent falls, multiple concussions from falls, hypertension, depression, history of PE, traumatic subdural hematoma.  She presented to the hospital because of headache.  She said she fell 2 days prior to admission and hit the back of his head but she did not seek medical attention.  She had another fall a day prior to admission but her son caught her so she did not fall to the ground completely.  She developed a headache after the fall.  Headache got worse so she came to the ED the following day.  In the ED, patient was found to be somnolent/lethargic.  Speech was also slurred initially in the ED. There was some concern that she had taken extra tramadol  for pain. Patient said she had diarrhea that started after the fall that occurred 2 days prior to admission.  She said she had about 4-5 watery stools a day but the diarrhea appears to have improved.  She has nausea but no vomiting, abdominal pain, dizziness, shortness of breath or chest pain.   She usually has chronic back pain and knee pain and takes tramadol  but it doesn't do any good so she does not take it every day.  She also takes diazepam  every morning.  She is usually unsteady on her feet because of bad knees.    ED Course: Vital signs in the ED: Temperature 97.5 F, pulse 66, respiratory rate 17, oxygen saturation 99% on room air.    Labs significant for sodium of 121.   CT angio head and neck IMPRESSION: 1. CT Head: No acute intracranial hemorrhage or acute territorial infarction. 2. CTA: No large vessel  occlusion. 3. Multifocal mild up to severe short segment narrowing of the bilateral M2 and distal MCA branches. Few short segment mild-to-moderate narrowing of the A2 anterior cerebral artery segments. Findings may reflect intracranial atherosclerotic disease though vasculitis and vasospasm could have a similar appearance. 4. Moderate short segment narrowing of the right V2 secondary to degenerative changes within the transverse foramina.     She was given 1 L of normal saline in the ED.   Assessment/Plan:   Principal Problem:   Acute metabolic encephalopathy Active Problems:   Hyponatremia    Body mass index is 33.27 kg/m.  (Class I obesity)   Hyponatremia: Improving.  Sodium up from 121-127.  Continue IV fluids.  Hyponatremia is probably hypovolemic from recent diarrhea.  Hold HCTZ-losartan      Acute toxic metabolic encephalopathy: Mental status has improved.  Probably from combination of low sodium and use of tramadol  and or diazepam .  Patient said she takes diazepam  every day but does not take tramadol  all the time because it doesn't do any good.     Headache: This is likely traumatic from fall.  No visible head injury or intracranial bleed on CT head. History of concussions in the past from frequent falls     General weakness, unsteady gait, falls at home: PT and OT evaluation. She thinks she will need rehab because she has been falling a lot at home.   Recent diarrhea: Improved.  Chronic HFpEF and COPD: Stable.  Continue bronchodilators     Comorbidities include type II DM, hypertension, chronic back pain, chronic knee pain, chronic chest pain/angina for about 6 years        Diet Order             Diet regular Room service appropriate? Yes; Fluid consistency: Thin  Diet effective now                                  Consultants: None  Procedures: None    Medications:    aspirin  EC  81 mg Oral Daily   atorvastatin    40 mg Oral QHS   budesonide -glycopyrrolate -formoterol   2 puff Inhalation BID   DULoxetine   60 mg Oral Daily   enoxaparin  (LOVENOX ) injection  40 mg Subcutaneous Q24H   isosorbide  mononitrate  60 mg Oral Daily   levothyroxine   125 mcg Oral QAC breakfast   metoprolol  succinate  25 mg Oral Daily   montelukast   10 mg Oral QHS   naproxen  250 mg Oral TID WC   pantoprazole   40 mg Oral Daily   pregabalin   75 mg Oral BID   roflumilast  500 mcg Oral Daily   Continuous Infusions:  sodium chloride  75 mL/hr at 09/16/24 9076     Anti-infectives (From admission, onward)    None              Family Communication/Anticipated D/C date and plan/Code Status   DVT prophylaxis: enoxaparin  (LOVENOX ) injection 40 mg Start: 09/16/24 1000     Code Status: Full Code  Family Communication: None Disposition Plan: May need to SNF at discharge   Status is: Inpatient Remains inpatient appropriate because: Hyponatremia       Subjective:   Interval events noted.  She complains of chest pain but she said this is chronic.  She says she has been having chest pain for about 6 years.  She takes Imdur  for this.  Objective:    Vitals:   09/16/24 0600 09/16/24 0630 09/16/24 0700 09/16/24 0816  BP: (!) 138/59 (!) 118/53 (!) 114/50   Pulse: 71 65 64   Resp: 20 14 15    Temp:    (!) 97.4 F (36.3 C)  TempSrc:    Oral  SpO2: 100% 100% 100%   Weight:      Height:       No data found.   Intake/Output Summary (Last 24 hours) at 09/16/2024 1118 Last data filed at 09/16/2024 0853 Gross per 24 hour  Intake --  Output 850 ml  Net -850 ml   Filed Weights   09/15/24 1405  Weight: 90.7 kg    Exam:  GEN: NAD SKIN: Warm and dry EYES: No pallor or icterus ENT: MMM CV: RRR PULM: CTA B ABD: soft, obese, NT, +BS CNS: AAO x 3, non focal EXT: No edema or tenderness        Data Reviewed:   I have personally reviewed following labs and imaging studies:  Labs: Labs show the  following:   Basic Metabolic Panel: Recent Labs  Lab 09/15/24 1408 09/16/24 0530  NA 121* 127*  K 4.7 4.6  CL 84* 91*  CO2 27 29  GLUCOSE 135* 142*  BUN 11 9  CREATININE 0.92 0.83  CALCIUM  9.5 9.0   GFR Estimated Creatinine Clearance: 68.2 mL/min (by C-G formula based on SCr of 0.83 mg/dL). Liver Function Tests: Recent  Labs  Lab 09/15/24 1408  AST 48*  ALT 28  ALKPHOS 94  BILITOT 0.4  PROT 6.1*  ALBUMIN 3.9   No results for input(s): LIPASE, AMYLASE in the last 168 hours. Recent Labs  Lab 09/15/24 1425  AMMONIA 32   Coagulation profile No results for input(s): INR, PROTIME in the last 168 hours.  CBC: Recent Labs  Lab 09/15/24 1408  WBC 10.3  NEUTROABS 6.1  HGB 13.9  HCT 41.1  MCV 94.3  PLT 430*   Cardiac Enzymes: No results for input(s): CKTOTAL, CKMB, CKMBINDEX, TROPONINI in the last 168 hours. BNP (last 3 results) No results for input(s): PROBNP in the last 8760 hours. CBG: No results for input(s): GLUCAP in the last 168 hours. D-Dimer: No results for input(s): DDIMER in the last 72 hours. Hgb A1c: No results for input(s): HGBA1C in the last 72 hours. Lipid Profile: No results for input(s): CHOL, HDL, LDLCALC, TRIG, CHOLHDL, LDLDIRECT in the last 72 hours. Thyroid function studies: No results for input(s): TSH, T4TOTAL, T3FREE, THYROIDAB in the last 72 hours.  Invalid input(s): FREET3 Anemia work up: No results for input(s): VITAMINB12, FOLATE, FERRITIN, TIBC, IRON, RETICCTPCT in the last 72 hours. Sepsis Labs: Recent Labs  Lab 09/15/24 1408  WBC 10.3    Microbiology No results found for this or any previous visit (from the past 240 hours).  Procedures and diagnostic studies:  CT Angio Head Neck W WO CM Result Date: 09/15/2024 EXAM: CTA Head and Neck with Intravenous Contrast. CT Head without Contrast. CLINICAL HISTORY: Neuro deficit, acute, stroke suspected. TECHNIQUE: Axial  CTA images of the head and neck performed with intravenous contrast. MIP reconstructed images were created and reviewed. Axial computed tomography images of the head/brain performed without intravenous contrast. Note: Per PQRS, the description of internal carotid artery percent stenosis, including 0 percent or normal exam, is based on North American Symptomatic Carotid Endarterectomy Trial (NASCET) criteria. Dose reduction technique was used including one or more of the following: automated exposure control, adjustment of mA and kV according to patient size, and/or iterative reconstruction. CONTRAST: With and without. 75 mL iohexol  (OMNIPAQUE ) 350 MG/ML injection. COMPARISON: MRI brain 11/09/2023 and CT head 11/09/2023. FINDINGS: CT HEAD: BRAIN: Scattered white matter hypodensities which are nonspecific but most commonly represent chronic microvascular ischemic changes. No acute intracranial hemorrhage or acute territorial infarction. No mass effect. No midline shift or extra-axial collection. VENTRICLES: No hydrocephalus. ORBITS: Bilateral lens replacement. SINUSES AND MASTOIDS: The paranasal sinuses and mastoid air cells are clear. CTA NECK: COMMON CAROTID ARTERIES: No significant stenosis. No dissection or occlusion. INTERNAL CAROTID ARTERIES: Partial retropharyngeal course of the right internal carotid artery. No stenosis by NASCET criteria. No dissection or occlusion. VERTEBRAL ARTERIES: The left vertebral artery arises directly from the aortic arch. There is obscuration of the proximal left cervical vertebral artery secondary to streak artifact from intravenous contrast bolus. There is moderate short segment narrowing of the right V2 secondary to degenerative facet and uncovertebral changes within the transverse foramina. No dissection or occlusion. CTA HEAD: ANTERIOR CEREBRAL ARTERIES: Hypoplastic left A1 segment. There is multifocal mild-to-moderate short segment narrowing of the bilateral A2 and A3  segments. No occlusion. No aneurysm. MIDDLE CEREBRAL ARTERIES: There are multifocal mild to moderate and up to severe short segment narrowing of the bilateral M2 and distal MCA branches. No occlusion. No aneurysm. POSTERIOR CEREBRAL ARTERIES: Hypoplastic left P1 segment. There is short segment narrowing of the left P3 segment. There are bilateral posterior communicating arteries. No occlusion.  No aneurysm. BASILAR ARTERY: No significant stenosis. No occlusion. No aneurysm. OTHER: SOFT TISSUES: No acute finding. No masses or lymphadenopathy. BONES: No acute osseous abnormality. IMPRESSION: 1. CT Head: No acute intracranial hemorrhage or acute territorial infarction. 2. CTA: No large vessel occlusion. 3. Multifocal mild up to severe short segment narrowing of the bilateral M2 and distal MCA branches. Few short segment mild-to-moderate narrowing of the A2 anterior cerebral artery segments. Findings may reflect intracranial atherosclerotic disease though vasculitis and vasospasm could have a similar appearance. 4. Moderate short segment narrowing of the right V2 secondary to degenerative changes within the transverse foramina. Electronically signed by: Prentice Spade 09/15/2024 04:57 PM EST RP Workstation: [id number]               LOS: 1 day   Shantera Monts  [employer] on [email or web address]. If 7PM-7AM, please contact night-coverage at [email or web address]     09/16/2024, 11:18 AM

## 2024-09-16 NOTE — Evaluation (Addendum)
 Physical Therapy Evaluation Patient Details Name: Belinda Garcia MRN: [id number] DOB: December 26, 1950 Today's Date: 09/16/2024  History of Present Illness  Pt is a73 year old female admitted with hyponatremia, acute toxic metabolic encephalopathy, headache (traumatic from fall), recent diarrhea      PMH significant for chronic low back pain, chronic knee pain from arthritis, cervical radiculopathy, COPD, diabetes mellitus, fatty liver, fibromyalgia, frequent falls, multiple concussions from falls, hypertension, depression, history of PE, traumatic subdural hematoma.  Clinical Impression  PT/OT cotx on this date. Pt is a pleasant 73 y.o. female admitted for the above. Pt received in semi-supine and agreeable to therapy. Initial BP 112/45. Pt able to mobilize to EOB with MOD A multi-modal cuing for hand placement and body mechanics as well as BLE management. She is able to perform STS to RW with 2+ MIN-MOD A but is able to maintain stand with +1 with vc for hip ext. Pt performed functional L lateral scoot with good clearance, supervision. Pt returned to bed and repositioned on L side with all needs met. BP at end of session: 107/45. Pt will continue to benefit from skilled therapy services to address her reduced functional strength, mobility, and activity tolerance.      If plan is discharge home, recommend the following: A lot of help with walking and/or transfers;A lot of help with bathing/dressing/bathroom   Can travel by private vehicle   No    Equipment Recommendations Other (comment) (TBD at next venue)  Recommendations for Other Services       Functional Status Assessment Patient has had a recent decline in their functional status and demonstrates the ability to make significant improvements in function in a reasonable and predictable amount of time.     Precautions / Restrictions Precautions Precautions: Fall Recall of Precautions/Restrictions: Impaired Restrictions Weight Bearing  Restrictions Per Provider Order: No      Mobility  Bed Mobility Overal bed mobility: Needs Assistance Bed Mobility: Supine to Sit, Sit to Supine, Rolling Rolling: Mod assist, Used rails   Supine to sit: Mod assist, Used rails Sit to supine: Mod assist   General bed mobility comments: management of BLE    Transfers Overall transfer level: Needs assistance Equipment used: Rolling walker (2 wheels) Transfers: Sit to/from Stand Sit to Stand: Min assist, Mod assist           General transfer comment: MIN-MOD for initial stand, frequent cuing for hip ext to remain in standing; L lateral scoot with good clearance    Ambulation/Gait               General Gait Details: NT - unsafe  Stairs            Wheelchair Mobility     Tilt Bed    Modified Rankin (Stroke Patients Only)       Balance Overall balance assessment: Needs assistance Sitting-balance support: Feet supported Sitting balance-Leahy Scale: Fair     Standing balance support: Bilateral upper extremity supported, During functional activity, Reliant on assistive device for balance Standing balance-Leahy Scale: Poor Standing balance comment: reliant on external support                             Pertinent Vitals/Pain Pain Assessment Pain Assessment: Faces Faces Pain Scale: Hurts little more Pain Location: low back, knees (chronic) Pain Descriptors / Indicators: Discomfort Pain Intervention(s): Monitored during session    Home Living Family/patient expects to be discharged to:: Private  residence Living Arrangements: Children Available Help at Discharge: Family;Available 24 hours/day Type of Home: House Home Access: Stairs to enter Entrance Stairs-Rails: Lawyer of Steps: 6 Alternate Level Stairs-Number of Steps: 12 Home Layout: Two level;Able to live on main level with bedroom/bathroom Home Equipment: Rolling Walker (2 wheels);Shower seat;Rollator (4  wheels);Cane - quad      Prior Function Prior Level of Function : Needs assist;History of Falls (last six months)             Mobility Comments: amb with QD mostly       Extremity/Trunk Assessment   Upper Extremity Assessment Upper Extremity Assessment: Generalized weakness    Lower Extremity Assessment Lower Extremity Assessment: Generalized weakness    Cervical / Trunk Assessment Cervical / Trunk Assessment: Kyphotic  Communication   Communication Communication: Impaired Factors Affecting Communication: Difficulty expressing self    Cognition Arousal: Alert Behavior During Therapy: WFL for tasks assessed/performed, Flat affect   PT - Cognitive impairments: No apparent impairments                         Following commands: Impaired Following commands impaired: Follows one step commands with increased time     Cueing Cueing Techniques: Verbal cues, Gestural cues, Tactile cues, Visual cues     General Comments General comments (skin integrity, edema, etc.): bruising noted in sacral area, ?breakdown, nurse notified    Exercises     Assessment/Plan    PT Assessment Patient needs continued PT services  PT Problem List Decreased strength;Decreased range of motion;Decreased activity tolerance;Decreased balance;Decreased mobility       PT Treatment Interventions Gait training;DME instruction;Functional mobility training;Therapeutic activities;Therapeutic exercise;Balance training    PT Goals (Current goals can be found in the Care Plan section)  Acute Rehab PT Goals Patient Stated Goal: to feel better PT Goal Formulation: With patient Time For Goal Achievement: 09/30/24 Potential to Achieve Goals: Good    Frequency Min 2X/week     Co-evaluation PT/OT/SLP Co-Evaluation/Treatment: Yes Reason for Co-Treatment: Complexity of the patient's impairments (multi-system involvement) PT goals addressed during session: Mobility/safety with mobility OT  goals addressed during session: ADL's and self-care       AM-PAC PT 6 Clicks Mobility  Outcome Measure Help needed turning from your back to your side while in a flat bed without using bedrails?: A Little Help needed moving from lying on your back to sitting on the side of a flat bed without using bedrails?: A Little Help needed moving to and from a bed to a chair (including a wheelchair)?: A Lot Help needed standing up from a chair using your arms (e.g., wheelchair or bedside chair)?: A Lot Help needed to walk in hospital room?: Total Help needed climbing 3-5 steps with a railing? : Total 6 Click Score: 12    End of Session Equipment Utilized During Treatment: Gait belt Activity Tolerance: Patient tolerated treatment well Patient left: in bed;with call bell/phone within reach;with bed alarm set Nurse Communication: Mobility status PT Visit Diagnosis: Repeated falls (R29.6);History of falling (Z91.81);Muscle weakness (generalized) (M62.81);Other abnormalities of gait and mobility (R26.89);Difficulty in walking, not elsewhere classified (R26.2)    Time: 1350-1410 PT Time Calculation (min) (ACUTE ONLY): 20 min   Charges:                 Allena Bulls, SPT  Maryanne Finder, PT, DPT Physical Therapist - [facility]  [facility] 09/16/2024, 3:14 PM

## 2024-09-17 DIAGNOSIS — G9341 Metabolic encephalopathy: Secondary | ICD-10-CM | POA: Diagnosis not present

## 2024-09-17 LAB — BASIC METABOLIC PANEL WITH GFR
Anion gap: 6 (ref 5–15)
BUN: 20 mg/dL (ref 8–23)
CO2: 30 mmol/L (ref 22–32)
Calcium: 9 mg/dL (ref 8.9–10.3)
Chloride: 99 mmol/L (ref 98–111)
Creatinine, Ser: 0.95 mg/dL (ref 0.44–1.00)
GFR, Estimated: >60 mL/min (ref >60)
Glucose, Bld: 207 mg/dL — ABNORMAL HIGH (ref 70–99)
Potassium: 4.5 mmol/L (ref 3.5–5.1)
Sodium: 134 mmol/L — ABNORMAL LOW (ref 135–145)

## 2024-09-17 MED ORDER — ACETAMINOPHEN 325 MG PO TABS: 650.0000 mg | ORAL_TABLET | Freq: Four times a day (QID) | ORAL | Status: AC | PRN

## 2024-09-17 MED ADMIN — Aspirin Tab Delayed Release 81 MG: 81 mg | ORAL | NDC 77333003125

## 2024-09-17 NOTE — Plan of Care (Signed)

## 2024-09-17 NOTE — TOC Initial Note (Signed)
 Transition of Care [facility]) - Initial/Assessment Note    Patient Details  Name: Belinda Garcia MRN: [id number] Date of Birth: [birth year]  [facility]) CM/SW Contact:    Belinda Jackquline RAMAN, RN Phone Number: 09/17/2024, 11:36 AM  Clinical Narrative: RNCM met with the patient at bedside, introduced myself and role and that discharge recommendations would be discussed. PT is recommending STR. Patient not in agreement with STR. She states that she'll see Dr. Malcolm on Tuesday and he has recommended aqua therapy for her. She also said that [facility] is not to be billed for this hospital stay. [facility] is to be billed and the business office can call her lawyer to get the billing information of send the bill to the lawyer and he will forward the bill to [facility]. Her lawyer's information is: Ozell Sax @ [phone]. Business office made aware. Patient has DME at home(Walker, multiple canes and a shower chair). Patient called her son [name] to come pick her up. Patient has discharge orders, no further concerns, RNCM signing off.  Expected Discharge Plan: Home/Self Care Barriers to Discharge: Barriers Resolved   Patient Goals and CMS Choice            Expected Discharge Plan and Services       Living arrangements for the past 2 months: Single Family Home Expected Discharge Date: 09/17/24                                    Prior Living Arrangements/Services Living arrangements for the past 2 months: Single Family Home Lives with:: Adult Children   Do you feel safe going back to the place where you live?: Yes      Need for Family Participation in Patient Care: Yes (Comment) Care giver support system in place?: Yes (comment) Current home services: DME Criminal Activity/Legal Involvement Pertinent to Current Situation/Hospitalization: No - Comment as needed  Activities of Daily Living   ADL Screening (condition at time of admission) Independently performs ADLs?: No Does  the patient have a NEW difficulty with bathing/dressing/toileting/self-feeding that is expected to last >3 days?: No Does the patient have a NEW difficulty with getting in/out of bed, walking, or climbing stairs that is expected to last >3 days?: No Does the patient have a NEW difficulty with communication that is expected to last >3 days?: No Is the patient deaf or have difficulty hearing?: No Does the patient have difficulty seeing, even when wearing glasses/contacts?: No Does the patient have difficulty concentrating, remembering, or making decisions?: No  Permission Sought/Granted                  Emotional Assessment Appearance:: Appears stated age, Well-Groomed Attitude/Demeanor/Rapport: Self-Confident, Gracious, Engaged Affect (typically observed): Calm, Accepting, Restless Orientation: : Oriented to Self, Oriented to Place, Oriented to  Time, Oriented to Situation Alcohol / Substance Use: Not Applicable Psych Involvement: No (comment)  Admission diagnosis:  Hyponatremia [E87.1] Blurry vision [H53.8] Altered mental status, unspecified altered mental status type [R41.82] Acute nonintractable headache, unspecified headache type [R51.9] Acute metabolic encephalopathy [G93.41] Patient Active Problem List   Diagnosis Date Noted   Acute metabolic encephalopathy 09/15/2024   Hyponatremia 09/15/2024   Obesity (BMI 30-39.9) 06/03/2024   COPD with acute exacerbation (HCC) 06/02/2024   COPD exacerbation (HCC) 06/02/2024   Chronic low back pain 03/14/2021   Rib fracture 02/08/2021   Traumatic subdural hematoma (HCC) 02/08/2021   Tobacco use  disorder 06/22/2020   Fatty liver 08/19/2018   Upper abdominal pain 06/17/2018   Nausea without vomiting 06/17/2018   Asthma without status asthmaticus 11/28/2015   Diabetes mellitus (HCC) 11/28/2015   Heart murmur 11/28/2015   Hypertension 11/28/2015   Pulmonary embolism (HCC) 11/28/2015   PCP:  Valora Lynwood FALCON, MD Pharmacy:   [facility] - [place], [place] - [address] [place] [address] Phone: [phone] Fax: [phone]     Social Drivers of Health (SDOH) Social History: SDOH Screenings   Food Insecurity: No Food Insecurity (09/16/2024)  Housing: Low Risk (09/16/2024)  Transportation Needs: No Transportation Needs (09/16/2024)  Utilities: Not At Risk (09/16/2024)  Financial Resource Strain: Medium Risk (06/15/2024)   Received from [facility]  Physical Activity: Inactive (01/06/2024)   Received from [facility]  Social Connections: Unknown (09/16/2024)  Stress: Stress Concern Present (01/06/2024)   Received from [facility]  Tobacco Use: High Risk (09/15/2024)  Health Literacy: Adequate Health Literacy (01/06/2024)   Received from [facility]   SDOH Interventions:     Readmission Risk Interventions     No data to display

## 2024-09-17 NOTE — Progress Notes (Addendum)
 Per Case Management, they need to speak with pt prior to discharge, stating that PT is recommending SNF for patient.  Dr Jens added to chat.  Will await further prior to discharging.  1137 Pt cleared for discharge.  Pt reaching out to family now to come and pick up.    1329 Pt has been unable to find a ride home.  Reached out to case management requesting taxi voucher.  Awaiting response at this time.  1400 Case Management aware but no response on voucher at this time.  1533 Still no further response from case management at this time.  This RN also attempted to contact son, Venetia, but this RN immediately forwarded to voicemail after 1 ring.  Charge RN aware of situation.  1538 Pt's son arrived and has taken pt at this time.

## 2024-09-17 NOTE — Discharge Summary (Signed)
 Physician Discharge Summary   Patient: Belinda Garcia MRN: [id number] DOB: [birth year]  Admit date:     09/15/2024  Discharge date: 09/17/2024  Discharge Physician: AIDA CHO   PCP: Valora Lynwood FALCON, MD   Recommendations at discharge:   Follow-up with PCP in 1 week Patient said she has an appointment with Dr. Victory Gunnels, neurosurgeon, in [place], on Tuesday, 09/20/2024.  She does not want to miss this appointment because she had rescheduled it in the past.  Discharge Diagnoses: Principal Problem:   Acute metabolic encephalopathy Active Problems:   Hyponatremia  Resolved Problems:   * No resolved hospital problems. *  Hospital Course:   Belinda Garcia is a 73 y.o. female with multiple medical problems including but not limited to chronic low back pain, chronic knee pain from arthritis, cervical radiculopathy, COPD, diabetes mellitus, fatty liver, fibromyalgia, frequent falls, multiple concussions from falls, hypertension, depression, history of PE, traumatic subdural hematoma.  She presented to the hospital because of headache.  She said she fell 2 days prior to admission and hit the back of his head but she did not seek medical attention.  She had another fall a day prior to admission but her son caught her so she did not fall to the ground completely.  She developed a headache after the fall.  Headache got worse so she came to the ED the following day.  In the ED, patient was found to be somnolent/lethargic.  Speech was also slurred initially in the ED. There was some concern that she had taken extra tramadol  for pain. Patient said she had diarrhea that started after the fall that occurred 2 days prior to admission.  She said she had about 4-5 watery stools a day but the diarrhea appears to have improved.  She has nausea but no vomiting, abdominal pain, dizziness, shortness of breath or chest pain.   She usually has chronic back pain and knee pain and takes tramadol  but it  doesn't do any good so she does not take it every day.  She also takes diazepam  every morning.  She is usually unsteady on her feet because of bad knees.  ED Course: Vital signs in the ED: Temperature 97.5 F, pulse 66, respiratory rate 17, oxygen saturation 99% on room air.    Labs significant for sodium of 121.   CT angio head and neck IMPRESSION: 1. CT Head: No acute intracranial hemorrhage or acute territorial infarction. 2. CTA: No large vessel occlusion. 3. Multifocal mild up to severe short segment narrowing of the bilateral M2 and distal MCA branches. Few short segment mild-to-moderate narrowing of the A2 anterior cerebral artery segments. Findings may reflect intracranial atherosclerotic disease though vasculitis and vasospasm could have a similar appearance. 4. Moderate short segment narrowing of the right V2 secondary to degenerative changes within the transverse foramina.     She was given 1 L of normal saline in the ED.     Assessment and Plan:   Hyponatremia: Improved.  Sodium up from 127-134.  Hyponatremia is probably hypovolemic from recent diarrhea.  Resume HCTZ-losartan  at discharge   Acute toxic metabolic encephalopathy: Improved.  Mental status is back to baseline.  This was probably from combination of low sodium and use of tramadol  and or diazepam .  Patient said she takes diazepam  every day but does not take tramadol  all the time because it doesn't do any good.     Headache: Improved.  This is likely traumatic from fall.  No visible head  injury or intracranial bleed on CT head. History of concussions in the past from frequent falls     General weakness, unsteady gait, chronic back pain, chronic knee pain, falls at home: Patient said she had back injury at work over 10 years ago and has been on worker's compensation for several years.  PT and OT recommended discharge to SNF.  However, patient said she prefers to go home.  She has an appointment with  Dr. Louis, neurosurgeon, in [place] on Tuesday, 09/20/2024 and she does not want to miss this.  She also said Dr. Louis has recommended aqua therapy for her. She understands that she is at high risk for falls with potential serious complications from a fall.  She said she lives at home with her son and she would be okay.     Recent diarrhea: Improved.     Chronic HFpEF and COPD: Stable.  Continue bronchodilators     Comorbidities include type II DM, hypertension, chronic back pain, chronic knee pain, chronic chest pain/angina for about 6 years     Her condition has improved and she is deemed stable for discharge to home today.  I called her son, Venetia, at [phone] from patient's room at 10:31 AM today but there was no response.  Patient said her son will pick her up from the hospital.         Consultants: None Procedures performed: None Disposition: Home health Diet recommendation:  Cardiac and Carb modified diet DISCHARGE MEDICATION: Allergies as of 09/17/2024   No Known Allergies      Medication List     STOP taking these medications    docusate sodium  100 MG capsule Commonly known as: COLACE   glipiZIDE -metformin  5-500 MG tablet Commonly known as: METAGLIP    hydrochlorothiazide  25 MG tablet Commonly known as: HYDRODIURIL    Lantus  SoloStar 100 UNIT/ML Solostar Pen Generic drug: insulin  glargine   lidocaine  5 % Commonly known as: LIDODERM    loperamide  2 MG capsule Commonly known as: IMODIUM    losartan  100 MG tablet Commonly known as: COZAAR    Novofine Pen Needle 32G X 6 MM Misc Generic drug: Insulin  Pen Needle   ondansetron  4 MG disintegrating tablet Commonly known as: ZOFRAN -ODT       TAKE these medications    acetaminophen  325 MG tablet Commonly known as: TYLENOL  Take 2 tablets (650 mg total) by mouth every 6 (six) hours as needed for mild pain (pain score 1-3) (or Fever >/= 101).   aspirin  EC 81 MG tablet Take 81 mg by mouth  daily. Swallow whole.   atorvastatin  40 MG tablet Commonly known as: LIPITOR Take 40 mg by mouth at bedtime.   Benadryl  Allergy 25 MG tablet Generic drug: diphenhydrAMINE  Take 25 mg by mouth every 6 (six) hours as needed for itching.   budesonide -glycopyrrolate -formoterol  160-9-4.8 MCG/ACT Aero inhaler Commonly known as: BREZTRI  Inhale 2 puffs into the lungs 2 (two) times daily.   chlorhexidine 0.12 % solution Commonly known as: PERIDEX RINSE MOUTH WITH (1 CAPFUL) FOR 30 SECONDS MORNING AND BEDTIME AFTER BRUSHING. SPIT OUT, DO NOT SWALLOW   cholecalciferol  1000 units tablet Commonly known as: VITAMIN D  Take 1 tablet (1,000 Units total) by mouth daily.   cyclobenzaprine  10 MG tablet Commonly known as: FLEXERIL  Take 10 mg by mouth 3 (three) times daily as needed for muscle spasms.   Cymbalta  60 MG capsule Generic drug: DULoxetine  Take 1 capsule by mouth daily.   diazepam  10 MG tablet Commonly known as: VALIUM  Take 1  tablet (10 mg total) by mouth at bedtime as needed for anxiety.   diclofenac sodium 1 % Gel Commonly known as: VOLTAREN Apply 2 g topically 4 (four) times daily.   esomeprazole 40 MG capsule Commonly known as: NEXIUM Take 40 mg by mouth 2 (two) times daily.   glipiZIDE  5 MG tablet Commonly known as: GLUCOTROL  Take 1 tablet by mouth 2 (two) times daily.   guaiFENesin -dextromethorphan  100-10 MG/5ML syrup Commonly known as: ROBITUSSIN DM Take 5 mLs by mouth every 4 (four) hours as needed for cough.   ipratropium-albuterol  0.5-2.5 (3) MG/3ML Soln Commonly known as: DUONEB Inhale 3 mLs into the lungs.  Take 3 mLs by nebulization 4 (four) times daily for 360 days   isosorbide  mononitrate 60 MG 24 hr tablet Commonly known as: IMDUR  Take 60 mg by mouth daily.   levothyroxine  125 MCG tablet Commonly known as: SYNTHROID  Take 125 mcg by mouth daily before breakfast.   losartan -hydrochlorothiazide  100-25 MG tablet Commonly known as: HYZAAR Take 1  tablet by mouth daily.   meclizine  25 MG tablet Commonly known as: ANTIVERT  Take 25 mg by mouth 3 (three) times daily as needed for dizziness.   meloxicam  15 MG tablet Commonly known as: MOBIC  Take 15 mg by mouth daily.   metoprolol  succinate 25 MG 24 hr tablet Commonly known as: TOPROL -XL Take 25 mg by mouth daily.   mometasone 0.1 % lotion Commonly known as: ELOCON 2-4 Drop(s) In Ear(s) Every Night PRN   montelukast  10 MG tablet Commonly known as: SINGULAIR  Take 10 mg by mouth at bedtime.   nitroGLYCERIN  0.4 MG SL tablet Commonly known as: NITROSTAT  Place 0.4 mg under the tongue every 5 (five) minutes as needed for chest pain.   pregabalin  75 MG capsule Commonly known as: LYRICA  Take 1 capsule (75 mg total) by mouth 2 (two) times daily.   PriLOSEC OTC 20 MG tablet Generic drug: omeprazole Take 1 tablet by mouth 2 (two) times daily.   roflumilast  500 MCG Tabs tablet Commonly known as: DALIRESP  Take 500 mcg by mouth daily.   traMADol  50 MG tablet Commonly known as: ULTRAM  Take 1 tablet (50 mg total) by mouth every 6 (six) hours as needed for moderate pain (pain score 4-6) or severe pain (pain score 7-10).   Ventolin  HFA 108 (90 Base) MCG/ACT inhaler Generic drug: albuterol  INHALE 1 PUFF BY MOUTH EVERY 4 HOURS AS NEEDED FOR SHORTNESS OF BREATH FOR WHEEZING        Discharge Exam: Filed Weights   09/15/24 1405  Weight: 90.7 kg   GEN: NAD SKIN: Warm and dry EYES: No pallor or icterus ENT: MMM CV: RRR PULM: CTA B ABD: soft, obese, NT, +BS CNS: AAO x 3, non focal EXT: No edema or tenderness   Condition at discharge: good  The results of significant diagnostics from this hospitalization (including imaging, microbiology, ancillary and laboratory) are listed below for reference.   Imaging Studies: CT Angio Head Neck W WO CM Result Date: 09/15/2024 EXAM: CTA Head and Neck with Intravenous Contrast. CT Head without Contrast. CLINICAL HISTORY: Neuro  deficit, acute, stroke suspected. TECHNIQUE: Axial CTA images of the head and neck performed with intravenous contrast. MIP reconstructed images were created and reviewed. Axial computed tomography images of the head/brain performed without intravenous contrast. Note: Per PQRS, the description of internal carotid artery percent stenosis, including 0 percent or normal exam, is based on North American Symptomatic Carotid Endarterectomy Trial (NASCET) criteria. Dose reduction technique was used including one or more  of the following: automated exposure control, adjustment of mA and kV according to patient size, and/or iterative reconstruction. CONTRAST: With and without. 75 mL iohexol  (OMNIPAQUE ) 350 MG/ML injection. COMPARISON: MRI brain 11/09/2023 and CT head 11/09/2023. FINDINGS: CT HEAD: BRAIN: Scattered white matter hypodensities which are nonspecific but most commonly represent chronic microvascular ischemic changes. No acute intracranial hemorrhage or acute territorial infarction. No mass effect. No midline shift or extra-axial collection. VENTRICLES: No hydrocephalus. ORBITS: Bilateral lens replacement. SINUSES AND MASTOIDS: The paranasal sinuses and mastoid air cells are clear. CTA NECK: COMMON CAROTID ARTERIES: No significant stenosis. No dissection or occlusion. INTERNAL CAROTID ARTERIES: Partial retropharyngeal course of the right internal carotid artery. No stenosis by NASCET criteria. No dissection or occlusion. VERTEBRAL ARTERIES: The left vertebral artery arises directly from the aortic arch. There is obscuration of the proximal left cervical vertebral artery secondary to streak artifact from intravenous contrast bolus. There is moderate short segment narrowing of the right V2 secondary to degenerative facet and uncovertebral changes within the transverse foramina. No dissection or occlusion. CTA HEAD: ANTERIOR CEREBRAL ARTERIES: Hypoplastic left A1 segment. There is multifocal mild-to-moderate short  segment narrowing of the bilateral A2 and A3 segments. No occlusion. No aneurysm. MIDDLE CEREBRAL ARTERIES: There are multifocal mild to moderate and up to severe short segment narrowing of the bilateral M2 and distal MCA branches. No occlusion. No aneurysm. POSTERIOR CEREBRAL ARTERIES: Hypoplastic left P1 segment. There is short segment narrowing of the left P3 segment. There are bilateral posterior communicating arteries. No occlusion. No aneurysm. BASILAR ARTERY: No significant stenosis. No occlusion. No aneurysm. OTHER: SOFT TISSUES: No acute finding. No masses or lymphadenopathy. BONES: No acute osseous abnormality. IMPRESSION: 1. CT Head: No acute intracranial hemorrhage or acute territorial infarction. 2. CTA: No large vessel occlusion. 3. Multifocal mild up to severe short segment narrowing of the bilateral M2 and distal MCA branches. Few short segment mild-to-moderate narrowing of the A2 anterior cerebral artery segments. Findings may reflect intracranial atherosclerotic disease though vasculitis and vasospasm could have a similar appearance. 4. Moderate short segment narrowing of the right V2 secondary to degenerative changes within the transverse foramina. Electronically signed by: Prentice Spade 09/15/2024 04:57 PM EST RP Workstation: [id number]   MM 3D SCREENING MAMMOGRAM BILATERAL BREAST Result Date: 08/23/2024 CLINICAL DATA:  Screening. EXAM: DIGITAL SCREENING BILATERAL MAMMOGRAM WITH TOMOSYNTHESIS AND CAD TECHNIQUE: Bilateral screening digital craniocaudal and mediolateral oblique mammograms were obtained. Bilateral screening digital breast tomosynthesis was performed. The images were evaluated with computer-aided detection. COMPARISON:  Previous exam(s). ACR Breast Density Category b: There are scattered areas of fibroglandular density. FINDINGS: There are no findings suspicious for malignancy. IMPRESSION: No mammographic evidence of malignancy. A result letter of this screening mammogram will  be mailed directly to the patient. RECOMMENDATION: Screening mammogram in one year. (Code:[id number]-B-01Y) BI-RADS CATEGORY  1: Negative. Electronically Signed   By: Alm Parkins M.D.   On: 08/23/2024 13:05    Microbiology: Results for orders placed or performed during the hospital encounter of 06/02/24  Resp panel by RT-PCR (RSV, Flu A&B, Covid) Anterior Nasal Swab     Status: None   Collection Time: 06/02/24  1:20 PM   Specimen: Anterior Nasal Swab  Result Value Ref Range Status   SARS Coronavirus 2 by RT PCR NEGATIVE NEGATIVE Final    Comment: (NOTE) SARS-CoV-2 target nucleic acids are NOT DETECTED.  The SARS-CoV-2 RNA is generally detectable in upper respiratory specimens during the acute phase of infection. The lowest concentration of SARS-CoV-2 viral copies this  assay can detect is 138 copies/mL. A negative result does not preclude SARS-Cov-2 infection and should not be used as the sole basis for treatment or other patient management decisions. A negative result may occur with  improper specimen collection/handling, submission of specimen other than nasopharyngeal swab, presence of viral mutation(s) within the areas targeted by this assay, and inadequate number of viral copies(<138 copies/mL). A negative result must be combined with clinical observations, patient history, and epidemiological information. The expected result is Negative.  Fact Sheet for Patients:  bloggercourse.com  Fact Sheet for Healthcare Providers:  seriousbroker.it  This test is no t yet approved or cleared by the [place]  FDA and  has been authorized for detection and/or diagnosis of SARS-CoV-2 by FDA under an Emergency Use Authorization (EUA). This EUA will remain  in effect (meaning this test can be used) for the duration of the COVID-19 declaration under Section 564(b)(1) of the Act, 21 U.S.C.section 360bbb-3(b)(1), unless the authorization is  terminated  or revoked sooner.       Influenza A by PCR NEGATIVE NEGATIVE Final   Influenza B by PCR NEGATIVE NEGATIVE Final    Comment: (NOTE) The Xpert Xpress SARS-CoV-2/FLU/RSV plus assay is intended as an aid in the diagnosis of influenza from Nasopharyngeal swab specimens and should not be used as a sole basis for treatment. Nasal washings and aspirates are unacceptable for Xpert Xpress SARS-CoV-2/FLU/RSV testing.  Fact Sheet for Patients: bloggercourse.com  Fact Sheet for Healthcare Providers: seriousbroker.it  This test is not yet approved or cleared by the [place]  FDA and has been authorized for detection and/or diagnosis of SARS-CoV-2 by FDA under an Emergency Use Authorization (EUA). This EUA will remain in effect (meaning this test can be used) for the duration of the COVID-19 declaration under Section 564(b)(1) of the Act, 21 U.S.C. section 360bbb-3(b)(1), unless the authorization is terminated or revoked.     Resp Syncytial Virus by PCR NEGATIVE NEGATIVE Final    Comment: (NOTE) Fact Sheet for Patients: bloggercourse.com  Fact Sheet for Healthcare Providers: seriousbroker.it  This test is not yet approved or cleared by the [place]  FDA and has been authorized for detection and/or diagnosis of SARS-CoV-2 by FDA under an Emergency Use Authorization (EUA). This EUA will remain in effect (meaning this test can be used) for the duration of the COVID-19 declaration under Section 564(b)(1) of the Act, 21 U.S.C. section 360bbb-3(b)(1), unless the authorization is terminated or revoked.  Performed at [facility], [address] [place], [place] [address]   Respiratory (~20 pathogens) panel by PCR     Status: None   Collection Time: 06/02/24  6:00 PM   Specimen: Nasopharyngeal Swab; Respiratory  Result Value Ref Range Status   Adenovirus NOT  DETECTED NOT DETECTED Final   Coronavirus 229E NOT DETECTED NOT DETECTED Final    Comment: (NOTE) The Coronavirus on the Respiratory Panel, DOES NOT test for the novel  Coronavirus (2019 nCoV)    Coronavirus HKU1 NOT DETECTED NOT DETECTED Final   Coronavirus NL63 NOT DETECTED NOT DETECTED Final   Coronavirus OC43 NOT DETECTED NOT DETECTED Final   Metapneumovirus NOT DETECTED NOT DETECTED Final   Rhinovirus / Enterovirus NOT DETECTED NOT DETECTED Final   Influenza A NOT DETECTED NOT DETECTED Final   Influenza B NOT DETECTED NOT DETECTED Final   Parainfluenza Virus 1 NOT DETECTED NOT DETECTED Final   Parainfluenza Virus 2 NOT DETECTED NOT DETECTED Final   Parainfluenza Virus 3 NOT DETECTED NOT DETECTED Final  Parainfluenza Virus 4 NOT DETECTED NOT DETECTED Final   Respiratory Syncytial Virus NOT DETECTED NOT DETECTED Final   Bordetella pertussis NOT DETECTED NOT DETECTED Final   Bordetella Parapertussis NOT DETECTED NOT DETECTED Final   Chlamydophila pneumoniae NOT DETECTED NOT DETECTED Final   Mycoplasma pneumoniae NOT DETECTED NOT DETECTED Final    Comment: Performed at [facility], [address], [place], [place] [address]  Culture, blood (Routine X 2) w Reflex to ID Panel     Status: None   Collection Time: 06/03/24  1:27 AM   Specimen: BLOOD  Result Value Ref Range Status   Specimen Description   Final    BLOOD BLOOD LEFT ARM Performed at [facility], [address], [place], [place] [address]    Special Requests   Final    BOTTLES DRAWN AEROBIC AND ANAEROBIC Blood Culture adequate volume Performed at [facility], [address] [place], [place] [address]    Culture   Final    NO GROWTH 5 DAYS Performed at [facility], [address] [place], [place] [address]    Report Status 06/08/2024 FINAL  Final  Culture, blood (Routine X 2) w Reflex to ID Panel     Status: None   Collection Time: 06/03/24  1:27 AM   Specimen: BLOOD  Result  Value Ref Range Status   Specimen Description   Final    BLOOD BLOOD RIGHT ARM Performed at [facility], [address] [place], [place] [address]    Special Requests   Final    BOTTLES DRAWN AEROBIC AND ANAEROBIC Blood Culture adequate volume Performed at [facility], [address], [place], [place] [address]    Culture   Final    NO GROWTH 5 DAYS Performed at [facility], [address] [place], [place] [address]    Report Status 06/08/2024 FINAL  Final  Expectorated Sputum Assessment w Gram Stain, Rflx to Resp Cult     Status: None   Collection Time: 06/03/24  5:20 AM   Specimen: Expectorated Sputum  Result Value Ref Range Status   Specimen Description EXPECTORATED SPUTUM  Final   Special Requests NONE  Final   Sputum evaluation   Final    Sputum specimen not acceptable for testing.  Please recollect.    JANESE ERNESTINA GULLY AT 9389 06/03/24 JG Performed at [facility], [address] [place], [place] [address]    Report Status 06/03/2024 FINAL  Final  Expectorated Sputum Assessment w Gram Stain, Rflx to Resp Cult     Status: None   Collection Time: 06/04/24  2:25 PM   Specimen: Sputum  Result Value Ref Range Status   Specimen Description SPUTUM  Final   Special Requests NONE  Final   Sputum evaluation   Final    THIS SPECIMEN IS ACCEPTABLE FOR SPUTUM CULTURE Performed at [facility], [address] [place], [place] [address]    Report Status 06/04/2024 FINAL  Final  Culture, Respiratory w Gram Stain     Status: None   Collection Time: 06/04/24  2:25 PM   Specimen: SPU  Result Value Ref Range Status   Specimen Description   Final    SPUTUM Performed at [facility], [address] [place], [place] [address]    Special Requests   Final    NONE Reflexed from [id number] Performed at [facility], [address], [place], [place] [address]  Gram Stain   Final    RARE WBC PRESENT, PREDOMINANTLY  PMN RARE GRAM POSITIVE COCCI RARE GRAM POSITIVE RODS RARE BUDDING YEAST SEEN    Culture   Final    FEW Normal respiratory flora-no Staph aureus or Pseudomonas seen Performed at [facility], [address] [place], [place] [address]    Report Status 06/07/2024 FINAL  Final    Labs: CBC: Recent Labs  Lab 09/15/24 1408  WBC 10.3  NEUTROABS 6.1  HGB 13.9  HCT 41.1  MCV 94.3  PLT 430*   Basic Metabolic Panel: Recent Labs  Lab 09/15/24 1408 09/16/24 0530 09/17/24 0423  NA 121* 127* 134*  K 4.7 4.6 4.5  CL 84* 91* 99  CO2 27 29 30   GLUCOSE 135* 142* 207*  BUN 11 9 20   CREATININE 0.92 0.83 0.95  CALCIUM  9.5 9.0 9.0   Liver Function Tests: Recent Labs  Lab 09/15/24 1408  AST 48*  ALT 28  ALKPHOS 94  BILITOT 0.4  PROT 6.1*  ALBUMIN 3.9   CBG: No results for input(s): GLUCAP in the last 168 hours.  Discharge time spent: greater than 30 minutes.  Signed: AIDA CHO, MD [facility] 09/17/2024

## 2024-10-26 ENCOUNTER — Ambulatory Visit

## 2024-10-26 DIAGNOSIS — L821 Other seborrheic keratosis: Secondary | ICD-10-CM

## 2024-10-26 DIAGNOSIS — L578 Other skin changes due to chronic exposure to nonionizing radiation: Secondary | ICD-10-CM

## 2024-10-26 DIAGNOSIS — D229 Melanocytic nevi, unspecified: Secondary | ICD-10-CM

## 2024-10-26 DIAGNOSIS — D1801 Hemangioma of skin and subcutaneous tissue: Secondary | ICD-10-CM

## 2024-10-26 DIAGNOSIS — L814 Other melanin hyperpigmentation: Secondary | ICD-10-CM

## 2024-10-26 DIAGNOSIS — Z1283 Encounter for screening for malignant neoplasm of skin: Secondary | ICD-10-CM

## 2024-10-26 DIAGNOSIS — W908XXA Exposure to other nonionizing radiation, initial encounter: Secondary | ICD-10-CM | POA: Diagnosis not present

## 2024-10-26 NOTE — Progress Notes (Signed)
" °  °  Subjective   Belinda Garcia is a 74 y.o. female who presents for the following: Total body skin exam for skin cancer screening and mole check. The patient has spots, moles and lesions to be evaluated, some may be new or changing and the patient may have concern these could be cancer. Patient is new patient.  Today patient reports: LOC at right scalp present for a while and does get irritated when combing her hair. No personal hx of skin cancer.   Review of Systems:    No other skin or systemic complaints except as noted in HPI or Assessment and Plan.  The following portions of the chart were reviewed this encounter and updated as appropriate: medications, allergies, medical history  Relevant Medical History:  n/a   Objective  (SKPE) Well appearing patient in no apparent distress; mood and affect are within normal limits. Examination was performed of the: Full Skin Examination: scalp, head, eyes, ears, nose, lips, neck, chest, axillae, abdomen, back, buttocks, bilateral upper extremities, bilateral lower extremities, hands, feet, fingers, toes, fingernails, and toenails.   Examination notable for: SKIN EXAM, Angioma(s): Scattered red vascular papule(s)  , Lentigo/lentigines: Scattered pigmented macules that are tan to brown in color and are somewhat non-uniform in shape and concentrated in the sun-exposed areas, Nevus/nevi: Scattered well-demarcated, regular, pigmented macule(s) and/or papule(s)  , Seborrheic Keratosis(es): Stuck-on appearing keratotic papule(s) on the trunk, none  irritated with redness, crusting, edema, and/or partial avulsion, Actinic Damage/Elastosis: chronic sun damage: dyspigmentation, telangiectasia, and wrinkling  Examination limited by: N/a       Assessment & Plan  (SKAP)   SKIN CANCER SCREENING PERFORMED TODAY.  BENIGN SKIN FINDINGS  - Lentigines  - Seborrheic keratoses  - Hemangiomas   - Nevus/Multiple Benign Nevi  - Reassurance provided regarding the  benign appearance of lesions noted on exam today; no treatment is indicated in the absence of symptoms/changes. - Reinforced importance of photoprotective strategies including liberal and frequent sunscreen use of a broad-spectrum SPF 30 or greater, use of protective clothing, and sun avoidance for prevention of cutaneous malignancy and photoaging.  Counseled patient on the importance of regular self-skin monitoring as well as routine clinical skin examinations as scheduled.   ACTINIC DAMAGE - Chronic condition, secondary to cumulative UV/sun exposure - Recommend daily broad spectrum sunscreen SPF 30+ to sun-exposed areas, reapply every 2 hours as needed.  - Staying in the shade or wearing long sleeves, sun glasses (UVA+UVB protection) and wide brim hats (4-inch brim around the entire circumference of the hat) are also recommended for sun protection.  - Call for new or changing lesions.  Dermal nevus vs neurofibroma - Patient advised can return for shave removal/biopsy if removal desired. Photo taken today.   Was sun protection counseling provided?: Yes   Level of service outlined above   Patient instructions (SKPI)   Procedures, orders, diagnosis for this visit:    There are no diagnoses linked to this encounter.  Return to clinic: Return in about 1 year (around 10/26/2025) for TBSE, w/ Dr. Raymund.  LILLETTE Lonell ROCKFORD Wilfred, CMA, am acting as scribe for Lauraine JAYSON Raymund, MD.  Documentation: I have reviewed the above documentation for accuracy and completeness, and I agree with the above.  Lauraine JAYSON Raymund, MD  "

## 2024-10-26 NOTE — Patient Instructions (Addendum)
 Sunscreen  Who needs sunscreen? Everyone. Sunscreen use can help prevent skin cancer by protecting you from the sun's harmful ultraviolet rays. Anyone can get skin cancer, regardless of age, gender or race. In fact, it is estimated that one in five Americans will develop skin cancer in their lifetime.  Sunscreen alone cannot fully protect you. In addition to wearing sunscreen, dermatologists recommend taking the following steps to protect your skin and find skin cancer early:  Seek shade when appropriate, remembering that the sun's rays are strongest between 10 a.m. and 2 p.m. If your shadow is shorter than you are, seek shade. Dress to protect yourself from the sun by wearing a lightweight long-sleeved shirt, pants, a wide-brimmed hat and sunglasses, when possible.  Use extra caution near water, snow and sand as they reflect the damaging rays of the sun, which can increase your chance of sunburn.  Get vitamin D safely through a healthy diet that may include vitamin supplements. Don't seek the sun. Avoid tanning beds. Ultraviolet light from the sun and tanning beds can cause skin cancer and wrinkling. If you want to look tan, you may wish to use a self-tanning product, but continue to use sunscreen with it.  When should I use sunscreen? Every day you go outside--even if you're just walking to and from your form of transportation. The sun emits harmful UV rays year-round. Even on cloudy days, up to 80 percent of the sun's harmful UV rays can penetrate your skin. Snow, sand and water increase the need for sunscreen because they reflect the sun's rays.  How much sunscreen should I use, and how often should I apply it? Most people only apply 25-50 percent of the recommended amount of sunscreen. Apply enough sunscreen to cover all exposed skin. Most adults need about 1 ounce -- or enough to fill a shot glass -- to fully cover their body.  Don't forget to apply to the tops of your feet, your neck, your ears  and the top of your head. Apply sunscreen to dry skin 15 minutes before going outdoors.  Skin cancer also can form on the lips. To protect your lips, apply a lip balm or lipstick that contains sunscreen with an SPF of 30 or higher.  When outdoors, reapply sunscreen approximately every two hours, or after swimming or sweating, according to the directions on the bottle.   Broad-spectrum sunscreens protect against both UVA and UVB rays. What is the difference between the rays? Sunlight consists of two types of harmful rays that reach the earth -- UVA rays and UVB rays. Overexposure to either can lead to skin cancer. In addition to causing skin cancer, here's what each of these rays do:  UVA rays (or aging rays) can prematurely age your skin, causing wrinkles and age spots, and can pass through window glass. UVB rays (or burning rays) are the primary cause of sunburn and are blocked by window glass  There is no safe way to tan. Every time you tan, you damage your skin. As this damage builds, you speed up the aging of your skin and increase your risk for all types of skin cancer.  What is the difference between chemical and physical sunscreens? Chemical sunscreens work like a sponge, absorbing the sun's rays. They contain one or more of the following active ingredients: oxybenzone, avobenzone, octisalate, octocrylene, homosalate and octinoxate. These formulations tend to be easier to rub into the skin without leaving a white residue.   Physical sunscreens work like a shield,  sitting sit on the surface of your skin and deflecting the sun's rays. They contain the active ingredients zinc oxide and/or titanium dioxide. Use this sunscreen if you have sensitive skin.   What type of sunscreen should I use? The best type of sunscreen is the one you will use again and again. Just make sure it offers broad-spectrum (UVA and UVB) protection, has an SPF of 30+, and is water-resistant. The kind of sunscreen you use is  a matter of personal choice, and may vary depending on the area of the body to be protected. Available sunscreen options include lotions, creams, gels, ointments, wax sticks and sprays.  Recommended physical sunscreens for face: - Neutrogena Sheer Zinc - Aveeno Positively Mineral Sensitive - CeraVe Hydrating Mineral (also has a tinted version) - La Roche-Posay Anthelios Mineral Face (comes as a cream, lotion, light fluid, and there is also a tinted version).  - EltaMD UV Clear (also has a tinted version)  Recommended physical sunscreens for body: - Neutrogena Sheer Zinc Dry-Touch Sunscreen Sensitive Skin Lotion Broad Spectrum SPF 50 - Aveeno Positively Mineral Sensitive Skin Sunscreen Broad Spectrum SPF 50 - La Roche-Posay Anthelios SPF 50 Mineral Sunscreen - Gentle Lotion - CeraVe Hydrating Mineral Sunscreen SPF 50  Recommended chemical sunscreens for face: - Anthelios UV Correct Face Sunscreen SPF 70 with Niacinamide - Neutrogena Clear Face Oil-Free SPF 50 with Helioplex - Neutrogena Sport Face Oil-Free SPF 70+ with Helioplex - Aveeno Protect + Hydrate Sunscreen For Face SPF 70 - La Roche-Posay Anthelios Light Fluid Sunscreen for Face SPF 60  Recommended chemical sunscreens for body: - Neutrogena Ultra Sheer Dry-Touch Sunscreen SPF 70 - Aveeno Protect + Hydrate Broad Spectrum All-Day Hydration SPF 60 (comes in a big pump) - La Roche-Posay Anthelios Melt-In Milk Sunscreen SPF 60   Melanoma ABCDEs  Melanoma is the most dangerous type of skin cancer, and is the leading cause of death from skin disease.  You are more likely to develop melanoma if you: Have light-colored skin, light-colored eyes, or red or blond hair Spend a lot of time in the sun Tan regularly, either outdoors or in a tanning bed Have had blistering sunburns, especially during childhood Have a close family member who has had a melanoma Have atypical moles or large birthmarks  Early detection of melanoma is key  since treatment is typically straightforward and cure rates are extremely high if we catch it early.   The first sign of melanoma is often a change in a mole or a new dark spot.  The ABCDE system is a way of remembering the signs of melanoma.  A for asymmetry:  The two halves do not match. B for border:  The edges of the growth are irregular. C for color:  A mixture of colors are present instead of an even brown color. D for diameter:  Melanomas are usually (but not always) greater than 6mm - the size of a pencil eraser. E for evolution:  The spot keeps changing in size, shape, and color.  Please check your skin once per month between visits. You can use a small mirror in front and a large mirror behind you to keep an eye on the back side or your body.   If you see any new or changing lesions before your next follow-up, please call to schedule a visit.  Please continue daily skin protection including broad spectrum sunscreen SPF 30+ to sun-exposed areas, reapplying every 2 hours as needed when you're outdoors.   Staying in  the shade or wearing long sleeves, sun glasses (UVA+UVB protection) and wide brim hats (4-inch brim around the entire circumference of the hat) are also recommended for sun protection.    Due to recent changes in healthcare laws, you may see results of your pathology and/or laboratory studies on MyChart before the doctors have had a chance to review them. We understand that in some cases there may be results that are confusing or concerning to you. Please understand that not all results are received at the same time and often the doctors may need to interpret multiple results in order to provide you with the best plan of care or course of treatment. Therefore, we ask that you please give us  2 business days to thoroughly review all your results before contacting the office for clarification. Should we see a critical lab result, you will be contacted sooner.   If You Need  Anything After Your Visit  If you have any questions or concerns for your doctor, please call our main line at [phone] and press option 4 to reach your doctor's medical assistant. If no one answers, please leave a voicemail as directed and we will return your call as soon as possible. Messages left after 4 pm will be answered the following business day.   You may also send us  a message via MyChart. We typically respond to MyChart messages within 1-2 business days.  For prescription refills, please ask your pharmacy to contact our office. Our fax number is [phone].  If you have an urgent issue when the clinic is closed that cannot wait until the next business day, you can page your doctor at the number below.    Please note that while we do our best to be available for urgent issues outside of office hours, we are not available 24/7.   If you have an urgent issue and are unable to reach us , you may choose to seek medical care at your doctor's office, retail clinic, urgent care center, or emergency room.  If you have a medical emergency, please immediately call 911 or go to the emergency department.  Pager Numbers  - Dr. Hester: [phone]  - Dr. Jackquline: [phone]  - Dr. Claudene: [phone]   In the event of inclement weather, please call our main line at [phone] for an update on the status of any delays or closures.  Dermatology Medication Tips: Please keep the boxes that topical medications come in in order to help keep track of the instructions about where and how to use these. Pharmacies typically print the medication instructions only on the boxes and not directly on the medication tubes.   If your medication is too expensive, please contact our office at [phone] option 4 or send us  a message through MyChart.   We are unable to tell what your co-pay for medications will be in advance as this is different depending on your insurance coverage. However, we  may be able to find a substitute medication at lower cost or fill out paperwork to get insurance to cover a needed medication.   If a prior authorization is required to get your medication covered by your insurance company, please allow us  1-2 business days to complete this process.  Drug prices often vary depending on where the prescription is filled and some pharmacies may offer cheaper prices.  The website www.goodrx.com contains coupons for medications through different pharmacies. The prices here do not account for what the cost may be with help from insurance (  it may be cheaper with your insurance), but the website can give you the price if you did not use any insurance.  - You can print the associated coupon and take it with your prescription to the pharmacy.  - You may also stop by our office during regular business hours and pick up a GoodRx coupon card.  - If you need your prescription sent electronically to a different pharmacy, notify our office through [facility], [place] or by phone at [phone] option 4.     Si Usted Necesita Algo Despus de Su Visita  Tambin puede enviarnos un mensaje a travs de Clinical cytogeneticist. Por lo general respondemos a los mensajes de MyChart en el transcurso de 1 a 2 das hbiles.  Para renovar recetas, por favor pida a su farmacia que se ponga en contacto con nuestra oficina. Randi lakes de fax es [place] [phone].  Si tiene un asunto urgente cuando la clnica est cerrada y que no puede esperar hasta el siguiente da hbil, puede llamar/localizar a su doctor(a) al nmero que aparece a continuacin.   Por favor, tenga en cuenta que aunque hacemos todo lo posible para estar disponibles para asuntos urgentes fuera del horario de [place], no estamos disponibles las 24 horas del da, los 7 [address]  [address] de la [place].   Si tiene un problema urgente y no puede comunicarse con nosotros, puede optar por buscar atencin mdica  en el consultorio de su doctor(a), en una  clnica privada, en un centro de atencin urgente o en una sala de emergencias.  Si tiene Engineer, drilling, por favor llame inmediatamente al 911 o vaya a la sala de emergencias.  Nmeros de bper  - Dr. Hester: [phone]  - Dra. Jackquline: [phone]  - Dr. Claudene: [phone]   En caso de inclemencias del tiempo, por favor llame a landry capes principal al [phone] para una actualizacin sobre el [place] de cualquier retraso o cierre.  Consejos para la medicacin en dermatologa: Por favor, guarde las cajas en las que vienen los medicamentos de uso tpico para ayudarle a seguir las instrucciones sobre dnde y cmo usarlos. Las farmacias generalmente imprimen las instrucciones del medicamento slo en las cajas y no directamente en los tubos del [place].   Si su medicamento es muy caro, por favor, pngase en contacto con landry rieger llamando al [phone] y presione la opcin 4 o envenos un mensaje a travs de Clinical cytogeneticist.   No podemos decirle cul ser su copago por los medicamentos por adelantado ya que esto es diferente dependiendo de la cobertura de su seguro. Sin embargo, es posible que podamos encontrar un medicamento sustituto a Audiological scientist un formulario para que el seguro cubra el medicamento que se considera necesario.   Si se requiere una autorizacin previa para que su compaa de seguros malta su medicamento, por favor permtanos de 1 a 2 das hbiles para completar este proceso.  Los precios de los medicamentos varan con frecuencia dependiendo del Environmental consultant de dnde se surte la receta y alguna farmacias pueden ofrecer precios ms baratos.  El sitio web www.goodrx.com tiene cupones para medicamentos de Health and safety inspector. Los precios aqu no tienen en cuenta lo que podra costar con la ayuda del seguro (puede ser ms barato con su seguro), pero el sitio web puede darle el precio si no utiliz Tourist information centre manager.  - Puede imprimir el cupn correspondiente  y llevarlo con su receta a la farmacia.  - Tambin puede pasar por nuestra oficina durante el horario de  atencin regular y Education officer, museum una tarjeta de cupones de GoodRx.  - Si necesita que su receta se enve electrnicamente a una farmacia diferente, informe a nuestra oficina a travs de MyChart de [place] o por telfono llamando al [phone] y presione la opcin 4.

## 2024-11-01 ENCOUNTER — Other Ambulatory Visit: Payer: Self-pay

## 2024-11-01 ENCOUNTER — Emergency Department: Payer: Self-pay

## 2024-11-01 ENCOUNTER — Emergency Department
Admission: EM | Admit: 2024-11-01 | Discharge: 2024-11-01 | Disposition: A | Discharge status: Home / Self Care | Payer: Self-pay | Attending: Emergency Medicine | Admitting: Emergency Medicine

## 2024-11-01 DIAGNOSIS — W01198A Fall on same level from slipping, tripping and stumbling with subsequent striking against other object, initial encounter: Secondary | ICD-10-CM | POA: Insufficient documentation

## 2024-11-01 DIAGNOSIS — E119 Type 2 diabetes mellitus without complications: Secondary | ICD-10-CM | POA: Insufficient documentation

## 2024-11-01 DIAGNOSIS — I10 Essential (primary) hypertension: Secondary | ICD-10-CM | POA: Insufficient documentation

## 2024-11-01 DIAGNOSIS — S0990XA Unspecified injury of head, initial encounter: Secondary | ICD-10-CM | POA: Insufficient documentation

## 2024-11-01 DIAGNOSIS — W19XXXA Unspecified fall, initial encounter: Secondary | ICD-10-CM

## 2024-11-01 NOTE — ED Provider Notes (Signed)
 "  [facility] Provider Note    Event Date/Time   First MD Initiated Contact with Patient 11/01/24 1623     (approximate)  History   Chief Complaint: Fall  HPI  NOOR WITTE is a 74 y.o. female with a past medical history of anxiety, arthritis, diabetes, gastric reflux, hypertension, presents to the emergency department after a fall and head injury.  According to the patient she slipped causing her to fall hitting the back of her head.  Patient denies LOC.  Denies blood thinners.  Patient does state moderate headache she took 2 Excedrin  prior to coming to the hospital.  Patient states the fall happened around 12 PM (4.5 hours ago).  Patient denies any other injuries.  Has been ambulatory since.  Physical Exam   Triage Vital Signs: ED Triage Vitals  Encounter Vitals Group     BP 11/01/24 1543 (!) 152/110     Girls Systolic BP Percentile --      Girls Diastolic BP Percentile --      Boys Systolic BP Percentile --      Boys Diastolic BP Percentile --      Pulse Rate 11/01/24 1542 74     Resp 11/01/24 1542 17     Temp 11/01/24 1542 98.3 F (36.8 C)     Temp Source 11/01/24 1542 Oral     SpO2 11/01/24 1542 94 %     Weight 11/01/24 1542 200 lb 9.9 oz (91 kg)     Height 11/01/24 1542 5' 5 (1.651 m)     Head Circumference --      Peak Flow --      Pain Score 11/01/24 1542 8     Pain Loc --      Pain Education --      Exclude from Growth Chart --     Most recent vital signs: Vitals:   11/01/24 1542 11/01/24 1543  BP:  (!) 152/110  Pulse: 74   Resp: 17   Temp: 98.3 F (36.8 C)   SpO2: 94%     General: Awake, no distress.  Mild tenderness/possible small hematoma to occipital scalp. CV:  Good peripheral perfusion.  Regular rate and rhythm  Resp:  Normal effort.  Equal breath sounds bilaterally.  Abd:  No distention.  Soft, nontender.   ED Results / Procedures / Treatments   RADIOLOGY  I reviewed interpreted CT head images.  No obvious bleed  on my evaluation. Radiology is read the CT scan is negative for acute abnormality.   MEDICATIONS ORDERED IN ED: Medications - No data to display   IMPRESSION / MDM / ASSESSMENT AND PLAN / ED COURSE  I reviewed the triage vital signs and the nursing notes.  Patient's presentation is most consistent with acute presentation with potential threat to life or bodily function.  Patient presents to the emergency department after a fall around 12 PM today in which she hit the back of her head.  Denies LOC but has been experiencing a moderate headache ever since.  Took Excedrin  prior to coming to the emergency department.  No neck pain.  Will obtain a CT scan of the head as precaution to rule out bleed.  Patient agreeable to plan of care.  CT scan is negative.  Patient appears well states her headache is improved.  States she is ready go home.  Discussed my typical concussion/head injury return precautions.  FINAL CLINICAL IMPRESSION(S) / ED DIAGNOSES   Fall Head injury  Note:  This document was prepared using Dragon voice recognition software and may include unintentional dictation errors.   Dorothyann Drivers, MD 11/01/24 1821  "

## 2024-11-01 NOTE — ED Triage Notes (Signed)
 First nurse note: pt to ED [facility] from home for fall today, hit back of head. +h/a. Denies LOC or blood thinners.

## 2025-10-26 ENCOUNTER — Ambulatory Visit
